# Patient Record
Sex: Female | Born: 1955 | Race: Black or African American | Hispanic: No | Marital: Single | Smoking: Current every day smoker
Health system: Southern US, Community
[De-identification: ages and names within clinical notes are randomized; demographics above are authoritative.]

## PROBLEM LIST (undated history)

## (undated) DIAGNOSIS — E890 Postprocedural hypothyroidism: Secondary | ICD-10-CM

## (undated) DIAGNOSIS — E119 Type 2 diabetes mellitus without complications: Secondary | ICD-10-CM

## (undated) DIAGNOSIS — K219 Gastro-esophageal reflux disease without esophagitis: Secondary | ICD-10-CM

## (undated) DIAGNOSIS — I10 Essential (primary) hypertension: Secondary | ICD-10-CM

## (undated) DIAGNOSIS — M1990 Unspecified osteoarthritis, unspecified site: Secondary | ICD-10-CM

## (undated) DIAGNOSIS — E7800 Pure hypercholesterolemia, unspecified: Secondary | ICD-10-CM

## (undated) HISTORY — PX: WISDOM TOOTH EXTRACTION: SHX21

## (undated) HISTORY — PX: ABDOMINAL HYSTERECTOMY: SHX81

## (undated) HISTORY — PX: COLON SURGERY: SHX602

---

## 1999-04-29 ENCOUNTER — Emergency Department (HOSPITAL_COMMUNITY): Admission: EM | Admit: 1999-04-29 | Discharge: 1999-04-30 | Payer: Self-pay

## 1999-04-30 ENCOUNTER — Emergency Department (HOSPITAL_COMMUNITY): Admission: EM | Admit: 1999-04-30 | Discharge: 1999-04-30 | Payer: Self-pay | Admitting: Emergency Medicine

## 2005-01-20 ENCOUNTER — Other Ambulatory Visit: Admission: RE | Admit: 2005-01-20 | Discharge: 2005-01-20 | Payer: Self-pay | Admitting: Obstetrics & Gynecology

## 2006-10-09 ENCOUNTER — Encounter: Admission: RE | Admit: 2006-10-09 | Discharge: 2006-10-09 | Payer: Self-pay | Admitting: Endocrinology

## 2006-12-01 ENCOUNTER — Encounter: Admission: RE | Admit: 2006-12-01 | Discharge: 2006-12-01 | Payer: Self-pay | Admitting: Endocrinology

## 2018-07-03 DIAGNOSIS — E890 Postprocedural hypothyroidism: Secondary | ICD-10-CM | POA: Diagnosis not present

## 2018-07-03 DIAGNOSIS — E1165 Type 2 diabetes mellitus with hyperglycemia: Secondary | ICD-10-CM | POA: Diagnosis not present

## 2018-07-03 DIAGNOSIS — E7800 Pure hypercholesterolemia, unspecified: Secondary | ICD-10-CM | POA: Diagnosis not present

## 2018-07-09 DIAGNOSIS — E7800 Pure hypercholesterolemia, unspecified: Secondary | ICD-10-CM | POA: Diagnosis not present

## 2018-07-09 DIAGNOSIS — I10 Essential (primary) hypertension: Secondary | ICD-10-CM | POA: Diagnosis not present

## 2018-07-09 DIAGNOSIS — E890 Postprocedural hypothyroidism: Secondary | ICD-10-CM | POA: Diagnosis not present

## 2018-07-09 DIAGNOSIS — E1165 Type 2 diabetes mellitus with hyperglycemia: Secondary | ICD-10-CM | POA: Diagnosis not present

## 2018-09-12 DIAGNOSIS — E890 Postprocedural hypothyroidism: Secondary | ICD-10-CM | POA: Diagnosis not present

## 2019-01-09 DIAGNOSIS — E890 Postprocedural hypothyroidism: Secondary | ICD-10-CM | POA: Diagnosis not present

## 2019-01-09 DIAGNOSIS — E1165 Type 2 diabetes mellitus with hyperglycemia: Secondary | ICD-10-CM | POA: Diagnosis not present

## 2019-01-09 DIAGNOSIS — I10 Essential (primary) hypertension: Secondary | ICD-10-CM | POA: Diagnosis not present

## 2019-01-09 DIAGNOSIS — E7800 Pure hypercholesterolemia, unspecified: Secondary | ICD-10-CM | POA: Diagnosis not present

## 2019-01-16 DIAGNOSIS — E890 Postprocedural hypothyroidism: Secondary | ICD-10-CM | POA: Diagnosis not present

## 2019-01-16 DIAGNOSIS — I10 Essential (primary) hypertension: Secondary | ICD-10-CM | POA: Diagnosis not present

## 2019-01-16 DIAGNOSIS — E1165 Type 2 diabetes mellitus with hyperglycemia: Secondary | ICD-10-CM | POA: Diagnosis not present

## 2019-01-16 DIAGNOSIS — E7800 Pure hypercholesterolemia, unspecified: Secondary | ICD-10-CM | POA: Diagnosis not present

## 2019-07-23 DIAGNOSIS — E1165 Type 2 diabetes mellitus with hyperglycemia: Secondary | ICD-10-CM | POA: Diagnosis not present

## 2019-07-23 DIAGNOSIS — E7800 Pure hypercholesterolemia, unspecified: Secondary | ICD-10-CM | POA: Diagnosis not present

## 2019-07-23 DIAGNOSIS — E890 Postprocedural hypothyroidism: Secondary | ICD-10-CM | POA: Diagnosis not present

## 2019-07-26 DIAGNOSIS — E7800 Pure hypercholesterolemia, unspecified: Secondary | ICD-10-CM | POA: Diagnosis not present

## 2019-07-26 DIAGNOSIS — E1165 Type 2 diabetes mellitus with hyperglycemia: Secondary | ICD-10-CM | POA: Diagnosis not present

## 2019-07-26 DIAGNOSIS — E890 Postprocedural hypothyroidism: Secondary | ICD-10-CM | POA: Diagnosis not present

## 2019-07-26 DIAGNOSIS — I10 Essential (primary) hypertension: Secondary | ICD-10-CM | POA: Diagnosis not present

## 2020-08-07 DIAGNOSIS — I10 Essential (primary) hypertension: Secondary | ICD-10-CM | POA: Diagnosis not present

## 2020-08-07 DIAGNOSIS — E7800 Pure hypercholesterolemia, unspecified: Secondary | ICD-10-CM | POA: Diagnosis not present

## 2020-08-07 DIAGNOSIS — E890 Postprocedural hypothyroidism: Secondary | ICD-10-CM | POA: Diagnosis not present

## 2020-08-07 DIAGNOSIS — E1165 Type 2 diabetes mellitus with hyperglycemia: Secondary | ICD-10-CM | POA: Diagnosis not present

## 2020-08-12 ENCOUNTER — Other Ambulatory Visit: Payer: Self-pay | Admitting: Endocrinology

## 2020-08-12 DIAGNOSIS — R599 Enlarged lymph nodes, unspecified: Secondary | ICD-10-CM

## 2020-08-12 DIAGNOSIS — Z23 Encounter for immunization: Secondary | ICD-10-CM | POA: Diagnosis not present

## 2020-08-12 DIAGNOSIS — E7800 Pure hypercholesterolemia, unspecified: Secondary | ICD-10-CM | POA: Diagnosis not present

## 2020-08-12 DIAGNOSIS — I10 Essential (primary) hypertension: Secondary | ICD-10-CM | POA: Diagnosis not present

## 2020-08-12 DIAGNOSIS — E890 Postprocedural hypothyroidism: Secondary | ICD-10-CM | POA: Diagnosis not present

## 2020-08-12 DIAGNOSIS — E1165 Type 2 diabetes mellitus with hyperglycemia: Secondary | ICD-10-CM | POA: Diagnosis not present

## 2020-08-25 ENCOUNTER — Ambulatory Visit
Admission: RE | Admit: 2020-08-25 | Discharge: 2020-08-25 | Disposition: A | Discharge status: Home / Self Care | Payer: Medicare HMO | Source: Ambulatory Visit | Attending: Endocrinology | Admitting: Endocrinology

## 2020-08-25 ENCOUNTER — Other Ambulatory Visit: Payer: Self-pay

## 2020-08-25 DIAGNOSIS — K118 Other diseases of salivary glands: Secondary | ICD-10-CM | POA: Diagnosis not present

## 2020-08-25 DIAGNOSIS — R599 Enlarged lymph nodes, unspecified: Secondary | ICD-10-CM

## 2020-08-25 DIAGNOSIS — R221 Localized swelling, mass and lump, neck: Secondary | ICD-10-CM | POA: Diagnosis not present

## 2020-08-25 DIAGNOSIS — M47819 Spondylosis without myelopathy or radiculopathy, site unspecified: Secondary | ICD-10-CM | POA: Diagnosis not present

## 2020-08-25 DIAGNOSIS — I7090 Unspecified atherosclerosis: Secondary | ICD-10-CM | POA: Diagnosis not present

## 2020-08-25 MED ORDER — IOPAMIDOL (ISOVUE-300) INJECTION 61%
75.0000 mL | Freq: Once | INTRAVENOUS | Status: AC | PRN
  Administered 2020-08-25: 75 mL via INTRAVENOUS

## 2020-11-21 DIAGNOSIS — E1129 Type 2 diabetes mellitus with other diabetic kidney complication: Secondary | ICD-10-CM

## 2020-11-21 HISTORY — DX: Type 2 diabetes mellitus with other diabetic kidney complication: E11.29

## 2021-01-21 ENCOUNTER — Encounter (HOSPITAL_COMMUNITY): Payer: Self-pay | Admitting: Emergency Medicine

## 2021-01-21 ENCOUNTER — Emergency Department (HOSPITAL_COMMUNITY): Payer: Medicare HMO

## 2021-01-21 ENCOUNTER — Inpatient Hospital Stay (HOSPITAL_COMMUNITY)
Admission: EM | Admit: 2021-01-21 | Discharge: 2021-02-01 | DRG: 872 | Disposition: A | Discharge status: Skilled Nursing Facility | Payer: Medicare HMO | Attending: Internal Medicine | Admitting: Internal Medicine

## 2021-01-21 DIAGNOSIS — M6282 Rhabdomyolysis: Secondary | ICD-10-CM | POA: Diagnosis not present

## 2021-01-21 DIAGNOSIS — N179 Acute kidney failure, unspecified: Secondary | ICD-10-CM

## 2021-01-21 DIAGNOSIS — A411 Sepsis due to other specified staphylococcus: Principal | ICD-10-CM | POA: Diagnosis present

## 2021-01-21 DIAGNOSIS — A4151 Sepsis due to Escherichia coli [E. coli]: Secondary | ICD-10-CM | POA: Diagnosis not present

## 2021-01-21 DIAGNOSIS — M6281 Muscle weakness (generalized): Secondary | ICD-10-CM | POA: Diagnosis not present

## 2021-01-21 DIAGNOSIS — E119 Type 2 diabetes mellitus without complications: Secondary | ICD-10-CM

## 2021-01-21 DIAGNOSIS — R6520 Severe sepsis without septic shock: Secondary | ICD-10-CM | POA: Diagnosis not present

## 2021-01-21 DIAGNOSIS — R0902 Hypoxemia: Secondary | ICD-10-CM | POA: Diagnosis not present

## 2021-01-21 DIAGNOSIS — S80212A Abrasion, left knee, initial encounter: Secondary | ICD-10-CM | POA: Diagnosis present

## 2021-01-21 DIAGNOSIS — S80211A Abrasion, right knee, initial encounter: Secondary | ICD-10-CM | POA: Diagnosis present

## 2021-01-21 DIAGNOSIS — B9620 Unspecified Escherichia coli [E. coli] as the cause of diseases classified elsewhere: Secondary | ICD-10-CM | POA: Diagnosis not present

## 2021-01-21 DIAGNOSIS — R7989 Other specified abnormal findings of blood chemistry: Secondary | ICD-10-CM | POA: Diagnosis present

## 2021-01-21 DIAGNOSIS — M2550 Pain in unspecified joint: Secondary | ICD-10-CM | POA: Diagnosis not present

## 2021-01-21 DIAGNOSIS — S2242XA Multiple fractures of ribs, left side, initial encounter for closed fracture: Secondary | ICD-10-CM | POA: Diagnosis not present

## 2021-01-21 DIAGNOSIS — S7001XA Contusion of right hip, initial encounter: Secondary | ICD-10-CM | POA: Diagnosis present

## 2021-01-21 DIAGNOSIS — R55 Syncope and collapse: Secondary | ICD-10-CM | POA: Diagnosis not present

## 2021-01-21 DIAGNOSIS — R278 Other lack of coordination: Secondary | ICD-10-CM | POA: Diagnosis not present

## 2021-01-21 DIAGNOSIS — E7800 Pure hypercholesterolemia, unspecified: Secondary | ICD-10-CM | POA: Diagnosis present

## 2021-01-21 DIAGNOSIS — W1830XA Fall on same level, unspecified, initial encounter: Secondary | ICD-10-CM | POA: Diagnosis present

## 2021-01-21 DIAGNOSIS — I471 Supraventricular tachycardia: Secondary | ICD-10-CM | POA: Diagnosis not present

## 2021-01-21 DIAGNOSIS — N390 Urinary tract infection, site not specified: Secondary | ICD-10-CM | POA: Diagnosis present

## 2021-01-21 DIAGNOSIS — Y92010 Kitchen of single-family (private) house as the place of occurrence of the external cause: Secondary | ICD-10-CM

## 2021-01-21 DIAGNOSIS — Z79890 Hormone replacement therapy (postmenopausal): Secondary | ICD-10-CM | POA: Diagnosis not present

## 2021-01-21 DIAGNOSIS — I119 Hypertensive heart disease without heart failure: Secondary | ICD-10-CM | POA: Diagnosis present

## 2021-01-21 DIAGNOSIS — R531 Weakness: Secondary | ICD-10-CM | POA: Diagnosis present

## 2021-01-21 DIAGNOSIS — S50311A Abrasion of right elbow, initial encounter: Secondary | ICD-10-CM | POA: Diagnosis present

## 2021-01-21 DIAGNOSIS — E1165 Type 2 diabetes mellitus with hyperglycemia: Secondary | ICD-10-CM | POA: Diagnosis not present

## 2021-01-21 DIAGNOSIS — I959 Hypotension, unspecified: Secondary | ICD-10-CM | POA: Diagnosis not present

## 2021-01-21 DIAGNOSIS — S8002XA Contusion of left knee, initial encounter: Secondary | ICD-10-CM | POA: Diagnosis present

## 2021-01-21 DIAGNOSIS — I10 Essential (primary) hypertension: Secondary | ICD-10-CM | POA: Diagnosis not present

## 2021-01-21 DIAGNOSIS — S51012A Laceration without foreign body of left elbow, initial encounter: Secondary | ICD-10-CM | POA: Diagnosis present

## 2021-01-21 DIAGNOSIS — Z20822 Contact with and (suspected) exposure to covid-19: Secondary | ICD-10-CM | POA: Diagnosis not present

## 2021-01-21 DIAGNOSIS — E872 Acidosis: Secondary | ICD-10-CM | POA: Diagnosis not present

## 2021-01-21 DIAGNOSIS — S0083XA Contusion of other part of head, initial encounter: Secondary | ICD-10-CM | POA: Diagnosis present

## 2021-01-21 DIAGNOSIS — A419 Sepsis, unspecified organism: Secondary | ICD-10-CM | POA: Diagnosis present

## 2021-01-21 DIAGNOSIS — E11649 Type 2 diabetes mellitus with hypoglycemia without coma: Secondary | ICD-10-CM

## 2021-01-21 DIAGNOSIS — R2681 Unsteadiness on feet: Secondary | ICD-10-CM | POA: Diagnosis not present

## 2021-01-21 DIAGNOSIS — S8001XA Contusion of right knee, initial encounter: Secondary | ICD-10-CM | POA: Diagnosis present

## 2021-01-21 DIAGNOSIS — L89022 Pressure ulcer of left elbow, stage 2: Secondary | ICD-10-CM | POA: Diagnosis present

## 2021-01-21 DIAGNOSIS — R410 Disorientation, unspecified: Secondary | ICD-10-CM | POA: Diagnosis not present

## 2021-01-21 DIAGNOSIS — S40022A Contusion of left upper arm, initial encounter: Secondary | ICD-10-CM | POA: Diagnosis present

## 2021-01-21 DIAGNOSIS — M25519 Pain in unspecified shoulder: Secondary | ICD-10-CM | POA: Diagnosis not present

## 2021-01-21 DIAGNOSIS — Z7401 Bed confinement status: Secondary | ICD-10-CM | POA: Diagnosis not present

## 2021-01-21 DIAGNOSIS — L89891 Pressure ulcer of other site, stage 1: Secondary | ICD-10-CM | POA: Diagnosis present

## 2021-01-21 DIAGNOSIS — E785 Hyperlipidemia, unspecified: Secondary | ICD-10-CM | POA: Diagnosis present

## 2021-01-21 DIAGNOSIS — S50312A Abrasion of left elbow, initial encounter: Secondary | ICD-10-CM | POA: Diagnosis present

## 2021-01-21 DIAGNOSIS — Z79899 Other long term (current) drug therapy: Secondary | ICD-10-CM

## 2021-01-21 DIAGNOSIS — S300XXA Contusion of lower back and pelvis, initial encounter: Secondary | ICD-10-CM | POA: Diagnosis present

## 2021-01-21 DIAGNOSIS — R404 Transient alteration of awareness: Secondary | ICD-10-CM | POA: Diagnosis not present

## 2021-01-21 DIAGNOSIS — R000 Tachycardia, unspecified: Secondary | ICD-10-CM | POA: Diagnosis not present

## 2021-01-21 DIAGNOSIS — K118 Other diseases of salivary glands: Secondary | ICD-10-CM | POA: Diagnosis present

## 2021-01-21 DIAGNOSIS — Z888 Allergy status to other drugs, medicaments and biological substances status: Secondary | ICD-10-CM

## 2021-01-21 DIAGNOSIS — Z90710 Acquired absence of both cervix and uterus: Secondary | ICD-10-CM

## 2021-01-21 DIAGNOSIS — S40021A Contusion of right upper arm, initial encounter: Secondary | ICD-10-CM | POA: Diagnosis present

## 2021-01-21 DIAGNOSIS — R262 Difficulty in walking, not elsewhere classified: Secondary | ICD-10-CM | POA: Diagnosis not present

## 2021-01-21 DIAGNOSIS — Z794 Long term (current) use of insulin: Secondary | ICD-10-CM

## 2021-01-21 DIAGNOSIS — K2970 Gastritis, unspecified, without bleeding: Secondary | ICD-10-CM | POA: Diagnosis present

## 2021-01-21 DIAGNOSIS — L89012 Pressure ulcer of right elbow, stage 2: Secondary | ICD-10-CM | POA: Diagnosis present

## 2021-01-21 DIAGNOSIS — E876 Hypokalemia: Secondary | ICD-10-CM | POA: Diagnosis present

## 2021-01-21 DIAGNOSIS — T796XXD Traumatic ischemia of muscle, subsequent encounter: Secondary | ICD-10-CM | POA: Diagnosis not present

## 2021-01-21 DIAGNOSIS — T796XXA Traumatic ischemia of muscle, initial encounter: Secondary | ICD-10-CM | POA: Diagnosis not present

## 2021-01-21 DIAGNOSIS — S2249XD Multiple fractures of ribs, unspecified side, subsequent encounter for fracture with routine healing: Secondary | ICD-10-CM | POA: Diagnosis not present

## 2021-01-21 DIAGNOSIS — E1129 Type 2 diabetes mellitus with other diabetic kidney complication: Secondary | ICD-10-CM

## 2021-01-21 DIAGNOSIS — Z043 Encounter for examination and observation following other accident: Secondary | ICD-10-CM | POA: Diagnosis not present

## 2021-01-21 DIAGNOSIS — R519 Headache, unspecified: Secondary | ICD-10-CM | POA: Diagnosis not present

## 2021-01-21 DIAGNOSIS — E890 Postprocedural hypothyroidism: Secondary | ICD-10-CM | POA: Diagnosis not present

## 2021-01-21 HISTORY — DX: Postprocedural hypothyroidism: E89.0

## 2021-01-21 HISTORY — DX: Essential (primary) hypertension: I10

## 2021-01-21 HISTORY — DX: Type 2 diabetes mellitus without complications: E11.9

## 2021-01-21 HISTORY — DX: Pure hypercholesterolemia, unspecified: E78.00

## 2021-01-21 LAB — URINALYSIS, COMPLETE (UACMP) WITH MICROSCOPIC
Bilirubin Urine: NEGATIVE
Glucose, UA: NEGATIVE mg/dL
Ketones, ur: 5 mg/dL — AB
Nitrite: NEGATIVE
Protein, ur: 100 mg/dL — AB
Specific Gravity, Urine: 1.02 (ref 1.005–1.030)
WBC, UA: >50 WBC/hpf — ABNORMAL HIGH (ref 0–5)
pH: 5 (ref 5.0–8.0)

## 2021-01-21 LAB — I-STAT ARTERIAL BLOOD GAS, ED
Acid-base deficit: 10 mmol/L — ABNORMAL HIGH (ref 0.0–2.0)
Bicarbonate: 14.3 mmol/L — ABNORMAL LOW (ref 20.0–28.0)
Calcium, Ion: 1.18 mmol/L (ref 1.15–1.40)
HCT: 36 % (ref 36.0–46.0)
Hemoglobin: 12.2 g/dL (ref 12.0–15.0)
O2 Saturation: 98 %
Patient temperature: 99.3
Potassium: 3.1 mmol/L — ABNORMAL LOW (ref 3.5–5.1)
Sodium: 136 mmol/L (ref 135–145)
TCO2: 15 mmol/L — ABNORMAL LOW (ref 22–32)
pCO2 arterial: 27.9 mmHg — ABNORMAL LOW (ref 32.0–48.0)
pH, Arterial: 7.319 — ABNORMAL LOW (ref 7.350–7.450)
pO2, Arterial: 117 mmHg — ABNORMAL HIGH (ref 83.0–108.0)

## 2021-01-21 LAB — CBC
HCT: 35.8 % — ABNORMAL LOW (ref 36.0–46.0)
Hemoglobin: 13.2 g/dL (ref 12.0–15.0)
MCH: 33.6 pg (ref 26.0–34.0)
MCHC: 36.9 g/dL — ABNORMAL HIGH (ref 30.0–36.0)
MCV: 91.1 fL (ref 80.0–100.0)
Platelets: 152 10*3/uL (ref 150–400)
RBC: 3.93 MIL/uL (ref 3.87–5.11)
RDW: 11.9 % (ref 11.5–15.5)
WBC: 16.6 10*3/uL — ABNORMAL HIGH (ref 4.0–10.5)
nRBC: 0 % (ref 0.0–0.2)

## 2021-01-21 LAB — RESP PANEL BY RT-PCR (FLU A&B, COVID) ARPGX2
Influenza A by PCR: NEGATIVE
Influenza B by PCR: NEGATIVE
SARS Coronavirus 2 by RT PCR: NEGATIVE

## 2021-01-21 LAB — CBC WITH DIFFERENTIAL/PLATELET
Abs Immature Granulocytes: 0.15 10*3/uL — ABNORMAL HIGH (ref 0.00–0.07)
Basophils Absolute: 0 10*3/uL (ref 0.0–0.1)
Basophils Relative: 0 %
Eosinophils Absolute: 0 10*3/uL (ref 0.0–0.5)
Eosinophils Relative: 0 %
HCT: 46 % (ref 36.0–46.0)
Hemoglobin: 16.4 g/dL — ABNORMAL HIGH (ref 12.0–15.0)
Immature Granulocytes: 1 %
Lymphocytes Relative: 6 %
Lymphs Abs: 1.2 10*3/uL (ref 0.7–4.0)
MCH: 32.5 pg (ref 26.0–34.0)
MCHC: 35.7 g/dL (ref 30.0–36.0)
MCV: 91.3 fL (ref 80.0–100.0)
Monocytes Absolute: 1.2 10*3/uL — ABNORMAL HIGH (ref 0.1–1.0)
Monocytes Relative: 6 %
Neutro Abs: 18.2 10*3/uL — ABNORMAL HIGH (ref 1.7–7.7)
Neutrophils Relative %: 87 %
Platelets: 188 10*3/uL (ref 150–400)
RBC: 5.04 MIL/uL (ref 3.87–5.11)
RDW: 11.6 % (ref 11.5–15.5)
WBC: 20.8 10*3/uL — ABNORMAL HIGH (ref 4.0–10.5)
nRBC: 0 % (ref 0.0–0.2)

## 2021-01-21 LAB — COMPREHENSIVE METABOLIC PANEL
ALT: 160 U/L — ABNORMAL HIGH (ref 0–44)
AST: 494 U/L — ABNORMAL HIGH (ref 15–41)
Albumin: 3.8 g/dL (ref 3.5–5.0)
Alkaline Phosphatase: 58 U/L (ref 38–126)
Anion gap: 19 — ABNORMAL HIGH (ref 5–15)
BUN: 29 mg/dL — ABNORMAL HIGH (ref 8–23)
CO2: 15 mmol/L — ABNORMAL LOW (ref 22–32)
Calcium: 9.6 mg/dL (ref 8.9–10.3)
Chloride: 98 mmol/L (ref 98–111)
Creatinine, Ser: 1.27 mg/dL — ABNORMAL HIGH (ref 0.44–1.00)
GFR, Estimated: 47 mL/min — ABNORMAL LOW (ref >60)
Glucose, Bld: 142 mg/dL — ABNORMAL HIGH (ref 70–99)
Potassium: 3.8 mmol/L (ref 3.5–5.1)
Sodium: 132 mmol/L — ABNORMAL LOW (ref 135–145)
Total Bilirubin: 1.8 mg/dL — ABNORMAL HIGH (ref 0.3–1.2)
Total Protein: 7.2 g/dL (ref 6.5–8.1)

## 2021-01-21 LAB — TSH: TSH: 8.604 u[IU]/mL — ABNORMAL HIGH (ref 0.350–4.500)

## 2021-01-21 LAB — AMMONIA: Ammonia: 25 umol/L (ref 9–35)

## 2021-01-21 LAB — TROPONIN I (HIGH SENSITIVITY): Troponin I (High Sensitivity): 3316 ng/L (ref <18)

## 2021-01-21 LAB — POTASSIUM: Potassium: 3.2 mmol/L — ABNORMAL LOW (ref 3.5–5.1)

## 2021-01-21 LAB — APTT: aPTT: 25 seconds (ref 24–36)

## 2021-01-21 LAB — MAGNESIUM: Magnesium: 1.5 mg/dL — ABNORMAL LOW (ref 1.7–2.4)

## 2021-01-21 LAB — PHOSPHORUS: Phosphorus: 3.1 mg/dL (ref 2.5–4.6)

## 2021-01-21 LAB — HEMOGLOBIN A1C
Hgb A1c MFr Bld: 5.5 % (ref 4.8–5.6)
Mean Plasma Glucose: 111.15 mg/dL

## 2021-01-21 LAB — LACTIC ACID, PLASMA
Lactic Acid, Venous: 5.9 mmol/L (ref 0.5–1.9)
Lactic Acid, Venous: 1.3 mmol/L (ref 0.5–1.9)

## 2021-01-21 LAB — HIV ANTIBODY (ROUTINE TESTING W REFLEX): HIV Screen 4th Generation wRfx: NONREACTIVE

## 2021-01-21 LAB — CULTURE, BLOOD (ROUTINE X 2): Culture: NO GROWTH

## 2021-01-21 LAB — CREATININE, SERUM
Creatinine, Ser: 0.96 mg/dL (ref 0.44–1.00)
GFR, Estimated: >60 mL/min (ref >60)

## 2021-01-21 LAB — LACTATE DEHYDROGENASE: LDH: 725 U/L — ABNORMAL HIGH (ref 98–192)

## 2021-01-21 LAB — PROTIME-INR
INR: 1.1 (ref 0.8–1.2)
Prothrombin Time: 14 seconds (ref 11.4–15.2)

## 2021-01-21 LAB — GLUCOSE, CAPILLARY: Glucose-Capillary: 117 mg/dL — ABNORMAL HIGH (ref 70–99)

## 2021-01-21 LAB — CBG MONITORING, ED: Glucose-Capillary: 151 mg/dL — ABNORMAL HIGH (ref 70–99)

## 2021-01-21 LAB — CK: Total CK: 26648 U/L — ABNORMAL HIGH (ref 38–234)

## 2021-01-21 MED ORDER — SODIUM CHLORIDE 0.9 % IV BOLUS
2000.0000 mL | Freq: Once | INTRAVENOUS | Status: AC
  Administered 2021-01-21: 2000 mL via INTRAVENOUS

## 2021-01-21 MED ORDER — KETOROLAC TROMETHAMINE 15 MG/ML IJ SOLN
15.0000 mg | Freq: Four times a day (QID) | INTRAMUSCULAR | Status: AC | PRN
  Administered 2021-01-21 – 2021-01-23 (×5): 15 mg via INTRAVENOUS
  Filled 2021-01-21 (×6): qty 1

## 2021-01-21 MED ORDER — SODIUM CHLORIDE 0.9 % IV SOLN: INTRAVENOUS | Status: DC

## 2021-01-21 MED ORDER — THIAMINE HCL 100 MG/ML IJ SOLN
100.0000 mg | Freq: Once | INTRAMUSCULAR | Status: AC
  Administered 2021-01-21: 100 mg via INTRAVENOUS
  Filled 2021-01-21: qty 2

## 2021-01-21 MED ORDER — ADENOSINE 6 MG/2ML IV SOLN
INTRAVENOUS | Status: AC
  Administered 2021-01-21: 6 mg
  Filled 2021-01-21: qty 6

## 2021-01-21 MED ORDER — INSULIN ASPART 100 UNIT/ML ~~LOC~~ SOLN
0.0000 [IU] | Freq: Three times a day (TID) | SUBCUTANEOUS | Status: DC
  Administered 2021-01-24 – 2021-02-01 (×9): 1 [IU] via SUBCUTANEOUS

## 2021-01-21 MED ORDER — ATENOLOL 50 MG PO TABS: 50.0000 mg | ORAL_TABLET | Freq: Every day | ORAL | Status: DC

## 2021-01-21 MED ORDER — LEVOTHYROXINE SODIUM 100 MCG PO TABS
100.0000 ug | ORAL_TABLET | Freq: Every day | ORAL | Status: DC
  Administered 2021-01-22 – 2021-02-01 (×10): 100 ug via ORAL
  Filled 2021-01-21 (×12): qty 1

## 2021-01-21 MED ORDER — VANCOMYCIN HCL 1000 MG/200ML IV SOLN
1000.0000 mg | Freq: Once | INTRAVENOUS | Status: DC
  Filled 2021-01-21: qty 200

## 2021-01-21 MED ORDER — POTASSIUM CHLORIDE CRYS ER 20 MEQ PO TBCR
40.0000 meq | EXTENDED_RELEASE_TABLET | Freq: Once | ORAL | Status: AC
  Administered 2021-01-21: 40 meq via ORAL
  Filled 2021-01-21: qty 2

## 2021-01-21 MED ORDER — IOHEXOL 300 MG/ML  SOLN
100.0000 mL | Freq: Once | INTRAMUSCULAR | Status: AC | PRN
  Administered 2021-01-21: 100 mL via INTRAVENOUS

## 2021-01-21 MED ORDER — HYDROCODONE-ACETAMINOPHEN 5-325 MG PO TABS: 1.0000 | ORAL_TABLET | ORAL | Status: DC | PRN

## 2021-01-21 MED ORDER — ACETAMINOPHEN 325 MG PO TABS: 650.0000 mg | ORAL_TABLET | Freq: Four times a day (QID) | ORAL | Status: DC | PRN

## 2021-01-21 MED ORDER — LACTATED RINGERS IV BOLUS
1000.0000 mL | Freq: Once | INTRAVENOUS | Status: AC
  Administered 2021-01-21: 1000 mL via INTRAVENOUS

## 2021-01-21 MED ORDER — POTASSIUM CHLORIDE IN NACL 20-0.9 MEQ/L-% IV SOLN
INTRAVENOUS | Status: DC
  Filled 2021-01-21 (×7): qty 1000

## 2021-01-21 MED ORDER — SODIUM CHLORIDE 0.9 % IV SOLN
2.0000 g | Freq: Once | INTRAVENOUS | Status: AC
  Administered 2021-01-21: 2 g via INTRAVENOUS
  Filled 2021-01-21: qty 2

## 2021-01-21 MED ORDER — ATENOLOL 25 MG PO TABS
50.0000 mg | ORAL_TABLET | Freq: Every day | ORAL | Status: DC
  Administered 2021-01-22 – 2021-01-25 (×4): 50 mg via ORAL
  Filled 2021-01-21 (×5): qty 2

## 2021-01-21 MED ORDER — SODIUM CHLORIDE 0.9 % IV BOLUS
1000.0000 mL | Freq: Once | INTRAVENOUS | Status: AC
  Administered 2021-01-21: 1000 mL via INTRAVENOUS

## 2021-01-21 MED ORDER — ENOXAPARIN SODIUM 40 MG/0.4ML ~~LOC~~ SOLN
40.0000 mg | SUBCUTANEOUS | Status: DC
  Administered 2021-01-21: 40 mg via SUBCUTANEOUS
  Filled 2021-01-21: qty 0.4

## 2021-01-21 MED ORDER — VANCOMYCIN HCL 1000 MG/200ML IV SOLN
1000.0000 mg | INTRAVENOUS | Status: DC
  Filled 2021-01-21: qty 200

## 2021-01-21 MED ORDER — SALINE SPRAY 0.65 % NA SOLN
1.0000 | Freq: Every day | NASAL | Status: DC | PRN
  Filled 2021-01-21: qty 44

## 2021-01-21 MED ORDER — MAGNESIUM SULFATE 4 GM/100ML IV SOLN
4.0000 g | Freq: Once | INTRAVENOUS | Status: AC
  Administered 2021-01-21: 4 g via INTRAVENOUS
  Filled 2021-01-21 (×2): qty 100

## 2021-01-21 MED ORDER — METRONIDAZOLE IN NACL 5-0.79 MG/ML-% IV SOLN
500.0000 mg | Freq: Once | INTRAVENOUS | Status: AC
  Administered 2021-01-21: 500 mg via INTRAVENOUS
  Filled 2021-01-21: qty 100

## 2021-01-21 MED ORDER — VANCOMYCIN HCL 1750 MG/350ML IV SOLN
1750.0000 mg | Freq: Once | INTRAVENOUS | Status: AC
  Administered 2021-01-21: 1750 mg via INTRAVENOUS
  Filled 2021-01-21: qty 350

## 2021-01-21 MED ORDER — SODIUM CHLORIDE 0.9 % IV SOLN
2.0000 g | Freq: Two times a day (BID) | INTRAVENOUS | Status: DC
  Administered 2021-01-21 – 2021-01-22 (×2): 2 g via INTRAVENOUS
  Filled 2021-01-21 (×2): qty 2

## 2021-01-21 NOTE — Progress Notes (Signed)
Pharmacy Antibiotic Note  Kristina Gates is a 65 y.o. female admitted on 01/21/2021 with sepsis. WBC 20.8, pt is tachycardic and tachypenic with a possible urinary source of infection. Pharmacy has been consulted for vancomycin and cefepime dosing.   Plan: Vancomycin 1750 mg IV x1 Vancomycin 1000 mg IV q24 hours (AUC 453, goal 400-550, Scr 1.27, pt reported wt of 80 kg) Cefepime 2g IV q12 hours Monitor clinical improvement, cultures, renal function, and de-escalation plan  Temp (24hrs), Avg:97.7 F (36.5 C), Min:97.7 F (36.5 C), Max:97.7 F (36.5 C)  Recent Labs  Lab 01/21/21 1116  WBC 20.8*  CREATININE 1.27*    CrCl cannot be calculated (Unknown ideal weight.).    Not on File  Antimicrobials this admission: Vancomycin 3/3 >>  Cefepime 3/3 >>  Metronidazole 3/3 >>  Dose adjustments this admission: N/A  Microbiology results: 3/3 BCx x2: pending 3/3 UCx: pending   Thank you for allowing pharmacy to be a part of this patient's care.  Levada Schilling PharmD Candidate 2022 01/21/2021 1:03 PM

## 2021-01-21 NOTE — ED Provider Notes (Signed)
[facility] EMERGENCY DEPARTMENT Provider Note   CSN: [id number] Arrival date & time: 01/21/21  1048     History Chief Complaint  Patient presents with   Altered Mental Status   Fall    Kristina Gates is a 65 y.o. female With a past medical history of hypertension, hyperlipidemia, postprocedural hypothyroidism, and diabetes.  EMS reports that they were called out to the patient's house today because family had not been able to get in touch with the patient for about 4 days.  Patient was found facedown on the ground covered in urine.  She complained of pain in her shoulders and legs when she was moved onto the gurney.  EMS reports that her vital signs were stable, blood sugar of 186.  Patient does not remember being found on the ground today and states that she has not had any injuries in does not know what happened.  There is a level 5 caveat due to altered mental status.  HPI     Past Medical History:  Diagnosis Date   Diabetes mellitus without complication (HCC)    Hypercholesteremia    Hypertension    Postprocedural hypothyroidism     Patient Active Problem List   Diagnosis Date Noted   Essential hypertension 01/21/2021   Hyperglycemia due to type 2 diabetes mellitus (HCC) 01/21/2021   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/2021   Pure hypercholesterolemia 01/21/2021   ,[facility], Surgical Hx, Social Hx unable to be reviewed at this time due to altered mentation.  OB History   No obstetric history on file.     History reviewed. No pertinent family history.  Social History   Tobacco Use   Smoking status: Never Smoker   Smokeless tobacco: Never Used    Home Medications Prior to Admission medications   Medication Sig Start Date End Date Taking? Authorizing Provider  atenolol (TENORMIN) 50 MG tablet Take 50 mg by mouth daily. 01/19/21   [provider]  atorvastatin (LIPITOR) 40 MG tablet Take 40 mg by mouth daily. 01/19/21   [provider]  hydrochlorothiazide (HYDRODIURIL) 25 MG tablet Take 25 mg by mouth daily. 01/19/21   [provider]  losartan (COZAAR) 100 MG tablet Take 100 mg by mouth daily. 01/19/21   [provider]  NOVOLOG MIX 70/30 FLEXPEN (70-30) 100 UNIT/ML FlexPen Inject 12 Units into the skin every evening. 11/25/20   [provider]  SYNTHROID 100 MCG tablet Take 100 mcg by mouth daily. 01/19/21   [provider]    Allergies    Patient has no allergy information on record.  Review of Systems   Review of Systems  Unable to perform ROS: Mental status change    Physical Exam Updated Vital Signs BP 138/84    Pulse (!) 105    Temp 97.7 F (36.5 C) (Rectal)    Resp (!) 22    SpO2 99%   Physical Exam Vitals and nursing note reviewed.  Constitutional:      General: She is not in acute distress.    Appearance: She is well-developed and well-nourished. She is not diaphoretic.  HENT:     Head: Normocephalic.     Comments: Large contusion over the forehead, TTP    Nose:     Comments: Crusted nasal congestion    Mouth/Throat:     Mouth: Mucous membranes are dry.  Eyes:     General: No scleral icterus.    Extraocular Movements: Extraocular movements intact.  Conjunctiva/sclera: Conjunctivae normal.     Pupils: Pupils are equal, round, and reactive to light.  Cardiovascular:     Rate and Rhythm: Normal rate and regular rhythm.     Heart sounds: Normal heart sounds. No murmur heard. No friction rub. No gallop.   Pulmonary:     Effort: Pulmonary effort is normal. No respiratory distress.     Breath sounds: Normal breath sounds.  Abdominal:     General: Bowel sounds are normal. There is no distension.     Palpations: Abdomen is soft. There is no mass.     Tenderness: There is no abdominal tenderness. There is no guarding.     Comments: Right upper quadrant surgical scar, midline lower abdominal surgical scar present  Musculoskeletal:     Cervical back:  Normal range of motion.     Comments: Bilateral shoulder pain, difficulty lifting the shoulders off the table.  Weak but able to move both lower extremities against gravity.  No pelvic instability. No midline spinal tenderness, step-off or crepitus.  Skin:    General: Skin is warm and dry.     Comments: Multiple areas of bruising over the bilateral knees, right hip and buttock, bilateral arms, skin tear noted on the left elbow.  Large bruise across the forehead.  Neurological:     Mental Status: She is alert. She is disoriented and confused.     GCS: GCS eye subscore is 4. GCS verbal subscore is 4. GCS motor subscore is 6.     Cranial Nerves: Cranial nerves are intact.     Sensory: Sensation is intact.     Motor: Motor function is intact.  Psychiatric:        Behavior: Behavior normal.     ED Results / Procedures / Treatments   Labs (all labs ordered are listed, but only abnormal results are displayed) Labs Reviewed  COMPREHENSIVE METABOLIC PANEL - Abnormal; Notable for the following components:      Result Value   Sodium 132 (*)    CO2 15 (*)    Glucose, Bld 142 (*)    BUN 29 (*)    Creatinine, Ser 1.27 (*)    AST 494 (*)    ALT 160 (*)    Total Bilirubin 1.8 (*)    GFR, Estimated 47 (*)    Anion gap 19 (*)    All other components within normal limits  CBC WITH DIFFERENTIAL/PLATELET - Abnormal; Notable for the following components:   WBC 20.8 (*)    Hemoglobin 16.4 (*)    Neutro Abs 18.2 (*)    Monocytes Absolute 1.2 (*)    Abs Immature Granulocytes 0.15 (*)    All other components within normal limits  TSH - Abnormal; Notable for the following components:   TSH 8.604 (*)    All other components within normal limits  CBG MONITORING, ED - Abnormal; Notable for the following components:   Glucose-Capillary 151 (*)    All other components within normal limits  CULTURE, BLOOD (ROUTINE X 2)  CULTURE, BLOOD (ROUTINE X 2)  URINE CULTURE  AMMONIA  URINALYSIS, COMPLETE  (UACMP) WITH MICROSCOPIC  CK  PROTIME-INR  APTT  LACTIC ACID, PLASMA  LACTIC ACID, PLASMA  URINALYSIS, ROUTINE W REFLEX MICROSCOPIC    EKG None  Radiology DG Chest Port 1 View  Result Date: 01/21/2021 CLINICAL DATA:  Altered mental status following a fall today. EXAM: PORTABLE CHEST 1 VIEW COMPARISON:  None. FINDINGS: Normal sized heart. Mildly tortuous and minimally  calcified thoracic aorta. Minimal linear atelectasis or scarring in the left lower lung zone. Otherwise clear lungs with normal vascularity. Mildly displaced left 4th posterior rib fracture. Minimally displaced left 6th and 7th posterior rib fractures. No pneumothorax. IMPRESSION: Left 4th, 6th and 7th posterior rib fractures without pneumothorax. Electronically Signed   By: Beckie Salts M.D.   On: 01/21/2021 11:45    Procedures .Critical Care Performed by: Arthor Captain, PA-C Authorized by: Arthor Captain, PA-C   Critical care provider statement:    Critical care time (minutes):  60   Critical care was necessary to treat or prevent imminent or life-threatening deterioration of the following conditions:  Sepsis, trauma and dehydration   Critical care was time spent personally by me on the following activities:  Discussions with consultants, evaluation of patient's response to treatment, examination of patient, ordering and performing treatments and interventions, ordering and review of laboratory studies, ordering and review of radiographic studies, pulse oximetry, re-evaluation of patient's condition, obtaining history from patient or surrogate and review of old charts    Medications Ordered in ED Medications  0.9 %  sodium chloride infusion (has no administration in time range)  ceFEPIme (MAXIPIME) 2 g in sodium chloride 0.9 % 100 mL IVPB (has no administration in time range)  metroNIDAZOLE (FLAGYL) IVPB 500 mg (has no administration in time range)  vancomycin (VANCOREADY) IVPB 1000 mg/200 mL (has no administration  in time range)  thiamine (B-1) injection 100 mg (100 mg Intravenous Given 01/21/21 1135)    ED Course  I have reviewed the triage vital signs and the nursing notes.  Pertinent labs & imaging results that were available during my care of the patient were reviewed by me and considered in my medical decision making (see chart for details).  Clinical Course as of 01/22/21 1013  Thu Jan 21, 2021  1245 TSH(!): 8.604 TSH markedly elevated [AH]  1249 AST(!): 494 [AH]  1249 ALT(!): 160 [AH]  1249 Total Bilirubin(!): 1.8 [AH]  1257 Anion gap(!): 19 [AH]  1348 CK Total(!): 26,648 [AH]  1419 Urinalysis, Complete w Microscopic Urine, Catheterized(!) [AH]  1434 Lactic Acid, Venous(!!): 5.9 [AH]  1713 Called to the room for severe tachycardia to 200. Consistent with SVT, no hx of the same or of a fib. She was converted to normal sinus rhythm with 6 mg of adenosine, Dr. Donnald Garre present and guiding treatment. Addtl laboratory workup initiated, LR ordered, electrolyte levels, potassium replacement. Consider bicarb. Consider beta blocker for rate control once VS improve. Trauma surgery consulted to be made aware of multiple rib fractures. Will consult hospitalist for admission. [JR]  1723 Dr. Cliffton Asters with trauma surgery made aware of patient's rib fractures and plan for admission.  Will consult. [JR]  1733 Dr. Luberta Robertson accepting medical admission. [JR]    Clinical Course User Index [AH] Arthor Captain, PA-C [JR] Robinson, [place] N, PA-C   MDM Rules/Calculators/A&P                          12:37 PM BP 138/84    Pulse (!) 105    Temp 97.7 F (36.5 C) (Rectal)    Resp (!) 22    SpO2 99%  Patient here with AMS. The differential diagnosis for AMS is extensive and includes, but is not limited to: drug overdose - opioids, alcohol, sedatives, antipsychotics, drug withdrawal, others; Metabolic: hypoxia, hypoglycemia, hyperglycemia, hypercalcemia, hypernatremia, hyponatremia, uremia, hepatic encephalopathy,  hypothyroidism, hyperthyroidism, vitamin B12 or thiamine deficiency, carbon monoxide  poisoning, Wilson's disease, Lactic acidosis, DKA/HHOS; Infectious: meningitis, encephalitis, bacteremia/sepsis, urinary tract infection, pneumonia, neurosyphilis; Structural: Space-occupying lesion, (brain tumor, subdural hematoma, hydrocephalus,); Vascular: stroke, subarachnoid hemorrhage, coronary ischemia, hypertensive encephalopathy, CNS vasculitis, thrombotic thrombocytopenic purpura, disseminated intravascular coagulation, hyperviscosity; Psychiatric: Schizophrenia, depression; Other: Seizure, hypothermia, heat stroke, ICU psychosis, dementia -"sundowning."  I have ordered labs that include CBC, CMP, ammonia, urinalysis, CBG which is resulted at 151, blood cultures, PT/INR, CK, APTT and TSH levels.  I have ordered a 1 view chest x-ray and CT head and c spine  12:38 PM Patient CBC w/ WHite count of 20.  Meets SIRS + suspect UTI given strong odor of urine. I have initiated protocol  This is a 64 year old female found face down in her home after family had been unable to contact her for 4 days.  I ordered and reviewed labs which include CBC which showed elevated white blood cell count of 20,000 with tachycardia, tachypnea she fits sepsis criteria and this was initiated.  Further lab values show patient's TSH level at 8.604 consistent with hypothyroidism, urine appears infected and patient covered with broad-spectrum antibiotics at initiation of sepsis protocol.  Patient's lactic acid level at 5.9 she is receiving greater than 30 mL/kg fluid resuscitation given the fact that she also has rhabdomyolysis with CK level greater than 25,000.  The patient is critically ill and will need admission however I am currently awaiting CT results on C-spine, head, chest abdomen and pelvis.  Patient's portable 1 view chest x-ray shows a left fourth sixth and seventh posterior rib fracture.  I personally reviewed these images.  I have  given signout to PA Roxan Hockey who will assume care of the patient who will need admission.  Admitted service based upon findings of CT imaging.  Patient will need trauma consult given rib fractures.  Final Clinical Impression(s) / ED Diagnoses Final diagnoses:  None    Rx / DC Orders ED Discharge Orders    None       Arthor Captain, PA-C 01/22/21 1017    Margarita Grizzle, MD 01/22/21 1443

## 2021-01-21 NOTE — H&P (Signed)
History and Physical:    Kristina NaBarbara A Merolla   ZOX:[id number] DOB: 12/29/1955 DOA: 01/21/2021  Referring MD/provider: Cecil CobbsPA Robinson PCP: Patient, No Pcp Per   Patient coming from: Home  Chief Complaint: Patient was found down during welfare check since her sister had not heard from her in 2 days.  History is per patient who is somewhat confused and chart review.  History of Present Illness:   Kristina Gates is an 65 y.o. female with PMH significant for HTN, DM 2, HL who was in her usual state of excellent health until about 4 to 5 days ago when she felt weak was not sure why she felt weak.  She states that she apparently had difficulty walking around and tried to rest.  She remembers at one point someone was knocking at the door but she was too tired to get it so did not.  She apparently fell but does not remember falling.  She does remember lying on the floor intermittently but has very little memory of the events.  She states that she texts her sister every morning and apparently her sister had not heard from her in 2 days so asked the police for a welfare check.  The police found her down on the floor covered in urine.  Patient is unable to provide a review of systems that she does not really remember much of what has been going on.  She does remember feeling very weak and tired.  He does not think she has had any fevers or chills but she is not sure.  Does not think she has had a cough or shortness of breath but is not sure.  ED Course:  The patient was noted to be acutely confused, normotensive with unlabored tachypnea.  T-max was 99.3.  She had a GCS of 14 when she came in.  Patient underwent extensive work-up which was notable for UTI, lactic acidosis, significant rhabdomyolysis with CPK of 26K, acute kidney injury.  Head CT was negative for any intracranial bleed or abnormality.  X-ray does reveal 4 consecutive rib fractures posteriorly without pneumothorax.  She was treated for sepsis with  aggressive fluid resuscitation and initiation of broad-spectrum antibiotics with vancomycin and cefepime and Flagyl.  Her mental status improved steadily with treatment of infection and with hydration.  However while she was being treated patient had acute episode of SVT with heart rate of 206.  This was aborted promptly with 6 mg of adenosine.  ROS:   ROS   Review of Systems: Really unable to provide is that she does not really remember exactly what was going on.  Past Medical History:   Past Medical History:  Diagnosis Date  . Diabetes mellitus without complication (HCC)   . Hypercholesteremia   . Hypertension   . Postprocedural hypothyroidism     Past Surgical History:   History reviewed. No pertinent surgical history.  Social History:   Social History   Socioeconomic History  . Marital status: Married    Spouse name: Not on file  . Number of children: Not on file  . Years of education: Not on file  . Highest education level: Not on file  Occupational History  . Not on file  Tobacco Use  . Smoking status: Never Smoker  . Smokeless tobacco: Never Used  Substance and Sexual Activity  . Alcohol use: Not on file  . Drug use: Not on file  . Sexual activity: Not on file  Other Topics Concern  .  Not on file  Social History Narrative  . Not on file   Social Determinants of Health   Financial Resource Strain: Not on file  Food Insecurity: Not on file  Transportation Needs: Not on file  Physical Activity: Not on file  Stress: Not on file  Social Connections: Not on file  Intimate Partner Violence: Not on file    Allergies   Metformin and related and Zestril [lisinopril]  Family history:   History reviewed. No pertinent family history.  Current Medications:   Prior to Admission medications   Medication Sig Start Date End Date Taking? Authorizing Provider  acetaminophen (TYLENOL) 325 MG tablet Take 650 mg by mouth every 6 (six) hours as needed for moderate  pain.   Yes [provider]  atenolol (TENORMIN) 50 MG tablet Take 50 mg by mouth daily. 01/19/21  Yes [provider]  atorvastatin (LIPITOR) 40 MG tablet Take 40 mg by mouth daily. 01/19/21  Yes [provider]  hydrochlorothiazide (HYDRODIURIL) 25 MG tablet Take 25 mg by mouth daily. 01/19/21  Yes [provider]  losartan (COZAAR) 100 MG tablet Take 100 mg by mouth daily. 01/19/21  Yes [provider]  Multiple Vitamin (MULTIVITAMIN WITH MINERALS) TABS tablet Take 1 tablet by mouth daily.   Yes [provider]  NOVOLOG MIX 70/30 FLEXPEN (70-30) 100 UNIT/ML FlexPen Inject 12 Units into the skin every evening. 11/25/20  Yes [provider]  sodium chloride (OCEAN) 0.65 % SOLN nasal spray Place 1 spray into both nostrils daily as needed for congestion.   Yes [provider]  SYNTHROID 100 MCG tablet Take 100 mcg by mouth daily. 01/19/21  Yes [provider]    Physical Exam:   Vitals:   01/21/21 1745 01/21/21 1800 01/21/21 1815 01/21/21 1830  BP: 107/75 129/89 (!) 163/115 136/85  Pulse: (!) 107 (!) 111 (!) 110 (!) 117  Resp: (!) 28 (!) 28 (!) 24 20  Temp:      TempSrc:      SpO2: 100% 100% 100% 100%  Weight:         Physical Exam: Blood pressure 136/85, pulse (!) 117, temperature 99.3 F (37.4 C), temperature source Oral, resp. rate 20, weight 80.3 kg, SpO2 100 %. Gen: Acutely ill-appearing female with some mild periorbital edema lying in bed appearing quite weak, somewhat tremulous and a little confused, she does have an intermittent cough. Eyes: sclera anicteric, conjuctiva mildly injected bilaterally CVS: S1-S2, regulary, no gallops Respiratory:  decreased air entry likely secondary to decreased inspiratory effort, few transmitted upper respiratory sounds. GI: NABS, soft, NT  LE: No edema. No cyanosis Neuro: grossly nonfocal, speaking coherently but is somewhat fuzzy with details. Skin: Significant redness on  her knees bilaterally with some abrasions and redness and elbows bilaterally.  No evidence of infection.   Data Review:    Labs: Basic Metabolic Panel: Recent Labs  Lab 01/21/21 1116 01/21/21 1708 01/21/21 1731  NA 132*  --  136  K 3.8 3.2* 3.1*  CL 98  --   --   CO2 15*  --   --   GLUCOSE 142*  --   --   BUN 29*  --   --   CREATININE 1.27*  --   --   CALCIUM 9.6  --   --   MG  --  1.5*  --   PHOS  --  3.1  --    Liver Function Tests: Recent Labs  Lab 01/21/21 1116  AST 494*  ALT 160*  ALKPHOS 58  BILITOT 1.8*  PROT 7.2  ALBUMIN 3.8   No results for input(s): LIPASE, AMYLASE in the last 168 hours. Recent Labs  Lab 01/21/21 1116  AMMONIA 25   CBC: Recent Labs  Lab 01/21/21 1116 01/21/21 1731  WBC 20.8*  --   NEUTROABS 18.2*  --   HGB 16.4* 12.2  HCT 46.0 36.0  MCV 91.3  --   PLT 188  --    Cardiac Enzymes: Recent Labs  Lab 01/21/21 1116  CKTOTAL 26,648*    BNP (last 3 results) No results for input(s): PROBNP in the last 8760 hours. CBG: Recent Labs  Lab 01/21/21 1112  GLUCAP 151*    Urinalysis    Component Value Date/Time   COLORURINE AMBER (A) 01/21/2021 1310   APPEARANCEUR CLOUDY (A) 01/21/2021 1310   LABSPEC 1.020 01/21/2021 1310   PHURINE 5.0 01/21/2021 1310   GLUCOSEU NEGATIVE 01/21/2021 1310   HGBUR LARGE (A) 01/21/2021 1310   BILIRUBINUR NEGATIVE 01/21/2021 1310   KETONESUR 5 (A) 01/21/2021 1310   PROTEINUR 100 (A) 01/21/2021 1310   NITRITE NEGATIVE 01/21/2021 1310   LEUKOCYTESUR TRACE (A) 01/21/2021 1310      Radiographic Studies: CT HEAD WO CONTRAST  Result Date: 01/21/2021 CLINICAL DATA:  Recent fall with headaches and facial pain, initial encounter EXAM: CT HEAD WITHOUT CONTRAST CT CERVICAL SPINE WITHOUT CONTRAST TECHNIQUE: Multidetector CT imaging of the head and cervical spine was performed following the standard protocol without intravenous contrast. Multiplanar CT image reconstructions of the cervical spine were  also generated. COMPARISON:  None. FINDINGS: CT HEAD FINDINGS Brain: No evidence of acute infarction, hemorrhage, hydrocephalus, extra-axial collection or mass lesion/mass effect. Vascular: No hyperdense vessel or unexpected calcification. Skull: Normal. Negative for fracture or focal lesion. Sinuses/Orbits: No acute finding. Other: Mild scalp hematoma is noted in the left posterior parietal region related to the recent injury. CT CERVICAL SPINE FINDINGS Alignment: Mild straightening of the normal cervical lordosis is noted likely related to muscular spasm. Skull base and vertebrae: 7 cervical segments are well visualized. Vertebral body height is well maintained. Mild osteophytic changes are noted primarily at C6-7 anteriorly. No acute fracture or acute facet abnormality is noted. The odontoid is within normal limits. Soft tissues and spinal canal: There is a 16 by 25 mm mildly hyperdense nodule identified in the posterior aspect of the left parotid gland. This was present on prior CT of the neck from 08/25/2020 and is somewhat smaller in size when compared with the prior exam. Upper chest: Visualized lung apices are within normal limits. Other: None IMPRESSION: CT of the head: Mild scalp hematoma in the left posterior parietal region. No acute intracranial abnormality noted. CT of the cervical spine: Mild degenerative change of the cervical spine without acute bony abnormality. Left parotid lesion somewhat smaller and less heterogeneous when compared with the prior exam as described. ENT referral is again recommended if this has not been performed. Electronically Signed   By: Alcide Clever M.D.   On: 01/21/2021 14:43   CT CHEST W CONTRAST  Result Date: 01/21/2021 CLINICAL DATA:  Larey Seat. Left rib fractures on a portable chest earlier today. Abdominal trauma. EXAM: CT CHEST, ABDOMEN, AND PELVIS WITH CONTRAST TECHNIQUE: Multidetector CT imaging of the chest, abdomen and pelvis was performed following the standard  protocol during bolus administration of intravenous contrast. CONTRAST:  OMNIPAQUE IOHEXOL 300 MG/ML  SOLN COMPARISON:  Portable chest obtained earlier today. FINDINGS: CT CHEST FINDINGS Cardiovascular:  Atheromatous calcifications, including the coronary arteries and aorta. Normal sized heart. Minimal pericardial effusion with a maximum thickness of 7 mm. Mediastinum/Nodes: Very small thyroid gland or thyroid remnant. No enlarged lymph nodes. Unremarkable esophagus. Lungs/Pleura: Minimal bilateral dependent atelectasis. Otherwise, clear lungs. No pneumothorax or pleural fluid. Musculoskeletal: Left posterolateral acute 4th through 8th rib fractures. Varying degrees of displacement. No spinal fractures or subluxations. The sternum is intact. Thoracic and lower cervical spine degenerative changes. Diffuse osteopenia. CT ABDOMEN PELVIS FINDINGS Hepatobiliary: No focal liver abnormality is seen. No gallstones, gallbladder wall thickening, or biliary dilatation. Pancreas: Unremarkable. No pancreatic ductal dilatation or surrounding inflammatory changes. Spleen: Normal in size without focal abnormality. Adrenals/Urinary Tract: Normal appearing adrenal glands. Small bilateral renal cysts. Unremarkable ureters and urinary bladder. Stomach/Bowel: Moderate diffuse low density wall thickening involving the gastric antrum, pylorus, duodenal bulb and 2nd portion of the duodenum. Minimal adjacent soft tissue stranding. Moderate to marked luminal narrowing. Unremarkable colon and appendix. Vascular/Lymphatic: Atheromatous arterial calcifications without aneurysm. No enlarged lymph nodes. Reproductive: Status post hysterectomy. No adnexal masses. Other: Small umbilical hernia containing fat. Multiple small supraumbilical and infraumbilical ventral hernias containing fat. Musculoskeletal: Lumbar spine degenerative changes. These include facet degenerative changes with associated grade 1 anterolisthesis at the L4-5 level. No  fractures, subluxations or dislocations. IMPRESSION: 1. Left posterolateral acute 4th through 8th rib fractures without pneumothorax. 2. Moderate diffuse low density wall thickening involving the gastric antrum, pylorus, duodenal bulb and 2nd portion of the duodenum with associated moderate to marked luminal narrowing. This is most likely due to gastritis and duodenitis. 3. Minimal pericardial effusion. 4. Multiple small supraumbilical and infraumbilical ventral hernias containing fat. 5. Calcific coronary artery and aortic atherosclerosis. Aortic Atherosclerosis (ICD10-I70.0). Electronically Signed   By: Beckie Salts M.D.   On: 01/21/2021 16:43   CT CERVICAL SPINE WO CONTRAST  Result Date: 01/21/2021 CLINICAL DATA:  Recent fall with headaches and facial pain, initial encounter EXAM: CT HEAD WITHOUT CONTRAST CT CERVICAL SPINE WITHOUT CONTRAST TECHNIQUE: Multidetector CT imaging of the head and cervical spine was performed following the standard protocol without intravenous contrast. Multiplanar CT image reconstructions of the cervical spine were also generated. COMPARISON:  None. FINDINGS: CT HEAD FINDINGS Brain: No evidence of acute infarction, hemorrhage, hydrocephalus, extra-axial collection or mass lesion/mass effect. Vascular: No hyperdense vessel or unexpected calcification. Skull: Normal. Negative for fracture or focal lesion. Sinuses/Orbits: No acute finding. Other: Mild scalp hematoma is noted in the left posterior parietal region related to the recent injury. CT CERVICAL SPINE FINDINGS Alignment: Mild straightening of the normal cervical lordosis is noted likely related to muscular spasm. Skull base and vertebrae: 7 cervical segments are well visualized. Vertebral body height is well maintained. Mild osteophytic changes are noted primarily at C6-7 anteriorly. No acute fracture or acute facet abnormality is noted. The odontoid is within normal limits. Soft tissues and spinal canal: There is a 16 by 25 mm  mildly hyperdense nodule identified in the posterior aspect of the left parotid gland. This was present on prior CT of the neck from 08/25/2020 and is somewhat smaller in size when compared with the prior exam. Upper chest: Visualized lung apices are within normal limits. Other: None IMPRESSION: CT of the head: Mild scalp hematoma in the left posterior parietal region. No acute intracranial abnormality noted. CT of the cervical spine: Mild degenerative change of the cervical spine without acute bony abnormality. Left parotid lesion somewhat smaller and less heterogeneous when compared with the prior exam as described. ENT referral is  again recommended if this has not been performed. Electronically Signed   By: Alcide Clever M.D.   On: 01/21/2021 14:43   CT ABDOMEN PELVIS W CONTRAST  Result Date: 01/21/2021 CLINICAL DATA:  Larey Seat. Left rib fractures on a portable chest earlier today. Abdominal trauma. EXAM: CT CHEST, ABDOMEN, AND PELVIS WITH CONTRAST TECHNIQUE: Multidetector CT imaging of the chest, abdomen and pelvis was performed following the standard protocol during bolus administration of intravenous contrast. CONTRAST:  OMNIPAQUE IOHEXOL 300 MG/ML  SOLN COMPARISON:  Portable chest obtained earlier today. FINDINGS: CT CHEST FINDINGS Cardiovascular: Atheromatous calcifications, including the coronary arteries and aorta. Normal sized heart. Minimal pericardial effusion with a maximum thickness of 7 mm. Mediastinum/Nodes: Very small thyroid gland or thyroid remnant. No enlarged lymph nodes. Unremarkable esophagus. Lungs/Pleura: Minimal bilateral dependent atelectasis. Otherwise, clear lungs. No pneumothorax or pleural fluid. Musculoskeletal: Left posterolateral acute 4th through 8th rib fractures. Varying degrees of displacement. No spinal fractures or subluxations. The sternum is intact. Thoracic and lower cervical spine degenerative changes. Diffuse osteopenia. CT ABDOMEN PELVIS FINDINGS Hepatobiliary: No  focal liver abnormality is seen. No gallstones, gallbladder wall thickening, or biliary dilatation. Pancreas: Unremarkable. No pancreatic ductal dilatation or surrounding inflammatory changes. Spleen: Normal in size without focal abnormality. Adrenals/Urinary Tract: Normal appearing adrenal glands. Small bilateral renal cysts. Unremarkable ureters and urinary bladder. Stomach/Bowel: Moderate diffuse low density wall thickening involving the gastric antrum, pylorus, duodenal bulb and 2nd portion of the duodenum. Minimal adjacent soft tissue stranding. Moderate to marked luminal narrowing. Unremarkable colon and appendix. Vascular/Lymphatic: Atheromatous arterial calcifications without aneurysm. No enlarged lymph nodes. Reproductive: Status post hysterectomy. No adnexal masses. Other: Small umbilical hernia containing fat. Multiple small supraumbilical and infraumbilical ventral hernias containing fat. Musculoskeletal: Lumbar spine degenerative changes. These include facet degenerative changes with associated grade 1 anterolisthesis at the L4-5 level. No fractures, subluxations or dislocations. IMPRESSION: 1. Left posterolateral acute 4th through 8th rib fractures without pneumothorax. 2. Moderate diffuse low density wall thickening involving the gastric antrum, pylorus, duodenal bulb and 2nd portion of the duodenum with associated moderate to marked luminal narrowing. This is most likely due to gastritis and duodenitis. 3. Minimal pericardial effusion. 4. Multiple small supraumbilical and infraumbilical ventral hernias containing fat. 5. Calcific coronary artery and aortic atherosclerosis. Aortic Atherosclerosis (ICD10-I70.0). Electronically Signed   By: Beckie Salts M.D.   On: 01/21/2021 16:43   DG Chest Port 1 View  Result Date: 01/21/2021 CLINICAL DATA:  Altered mental status following a fall today. EXAM: PORTABLE CHEST 1 VIEW COMPARISON:  None. FINDINGS: Normal sized heart. Mildly tortuous and minimally  calcified thoracic aorta. Minimal linear atelectasis or scarring in the left lower lung zone. Otherwise clear lungs with normal vascularity. Mildly displaced left 4th posterior rib fracture. Minimally displaced left 6th and 7th posterior rib fractures. No pneumothorax. IMPRESSION: Left 4th, 6th and 7th posterior rib fractures without pneumothorax. Electronically Signed   By: Beckie Salts M.D.   On: 01/21/2021 11:45    EKG: Independently reviewed.  EKG done at 16:56 today shows NSR at 95.  Normal intervals.  LAD at -60.  Isolated on ST elevations in V2, most likely J-point elevation with normal T wave contour.  EKG done at 16:46 shows SVT at 193.   Assessment/Plan:   Active Problems:   Sepsis [facility])  65 year old female was apparently down for anywhere from 2 to 4 days.  Work-up in ED reveals sepsis secondary to UTI, rhabdomyolysis, AKI.  Course was complicated by SVT which was aborted with  adenosine.   Found down for 2 to 4 days with subsequent rhabdomyolysis As noted above, patient was probably on the floor anywhere from 2 to 4 days with inability to get up. I suspect patient's weakness and fall was likely secondary to UTI/sepsis. CPK markedly elevated 26,000, transaminases are also elevated as is expected. Will treat with hydration, mobilize as is tolerated by patient PT and OT can be consulted in the morning if patient is up to it.  UTI/sepsis Patient has gotten aggressive fluid resuscitation in the ED Continue cefepime and vancomycin pending cultures Patient does have a cough, can consider repeating chest x-ray in the morning especially given multiple rib fractures.  Rib Fractures without PTX Left posterior lateral fourth through eighth rib fractures without pneumothorax Seen by trauma surgery who recommended conservative treatment with pain management and respiratory therapy to prevent atelectasis and/or pneumonia.  SVT Patient had sudden episode of SVT with heart rate at 219  while in the ED This was aborted with adenosine 6 mg IV and has not recurred Will add on troponin to labs although patient denied any chest pain during event Patient self denies any knowledge of previous SVT or palpitations Place patient on telemetry, supplement potassium and magnesium  Hypokalemia Potassium has fallen with hydration, now down to 3.1. K. Dur 40 mill globin p.o. x1 with 20 of K and IV fluids at 150 cc an hour  Hypomagnesemia Magnesium 1.5 on check, will replete with 4 g and recheck in the morning  AG acidosis Anion gap acidosis is most likely combination of acute renal failure and lactic acidosis This should resolve with treatment of infection and hydration Follow anion gap closely, I do not believe there is any concern for any ingestions  AKI Significantly improved with hydration, will continue gentle hydration  Erythema on knees and elbows This is most likely early pressure injury or ecchymoses I do not believe these are from cellulitis or infection especially as they are bilateral and symmetrical Patient was apparently either trying to crawl on her knees and elbows and or was kneeling for some part of the time causing these injuries. Treat with pain management and PT as she is able to tolerate  Gastritis Inflammation seen in the duodenum antrum and pylorus consistent with gastritis on CT Patient herself denies any GI complaints She is not on a PPI or H2 blocker at home, this can be started as warranted  DM2 Patient is on 12 units 70/30 at home, will hold while here and start patient on SSI AC at bedtime  Hypothyroidism Restart Synthroid  Left parotid gland lesion Heterogenous parotid gland lesion was seen on previous CT and is smaller but more diffuse on present CT.   It can be followed up as an outpatient   HTN Blood pressure is improved with hydration, restart atenolol tomorrow morning especially given SVT.   Continue to hold HCTZ and losartan until  patient is adequately fluid resuscitation given AKI.   Other information:   DVT prophylaxis: Enoxaparin ordered. Code Status: Full Family Communication: Spoke with patient's sister Ms Ward Disposition Plan: TBD Consults called: None Admission status: Inpatient   Zykeriah Mathia Tublu Mileigh Tilley [facility]  If 7PM-7AM, please contact night-coverage [email or web address] Password [facility] 01/21/2021, 6:40 PM
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
## 2021-01-21 NOTE — Sepsis Progress Note (Signed)
Notified bedside nurse of need to draw repeat lactic acid after fluid bolus is completed. 
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
## 2021-01-21 NOTE — Progress Notes (Addendum)
EKG done. Paged B. Morrison,NP. awaiting for call back. Recent V/S BP 110/56 RR18 T98.0 PR90 100%2L. Patient is alert and verbal, denies any chest pain/tightness. Call bell within reach and will continue to monitor.

## 2021-01-21 NOTE — Sepsis Progress Note (Signed)
elink monitoring code sepsis.  

## 2021-01-21 NOTE — Sepsis Progress Note (Signed)
Reported from bedside RN that lab misplaced the the initial lactic acid.  Bedside RN redrew and resent.

## 2021-01-21 NOTE — ED Notes (Signed)
Pt's HR noted to be 202 SVT, pt alert, oriented, denies any pain or shortness of breath.  Unable to perform vagal  maneuver d/t rib pain.  MD notified.  MD and PA to bedside. Pads placed on pt, suction set up, BVM at bedside. Pt given adenosine 6mg  IVP with 75ml NS IVP immediately following.  Pt alert throughout, tolerated well. Pt's HR returned to 105 ST.   EKGs obtained pre and post adenosine.

## 2021-01-21 NOTE — Progress Notes (Signed)
Patient arrived to [phone]. Alert and oriented x4, able to make needs known. Grimacing noted upon coughing, 10/10 pain score. PRN pain med given. Also educated how to use the Incentive spirometer using teach-back method, effective. No c/o DOB/SOB. 100%/2L. Patient is currently resting in bed comfortably with call bell within reach and will continue to monitor. V/S BP 128/59 T97.9 PR96 [id number]

## 2021-01-21 NOTE — Consult Note (Signed)
Trauma consult  HPI: Kristina Gates is an 65 y.o. female hypothyroidism, HLD - brought to ED after welfare check. She reports ~3 days ago she heard knocking on her door, went to answer and her legs 'gave out' and she collapsed on the floor. Attempted to get up and crawl but was unable to stand. Eventually got some strength and made it to bed, went to sleep. Awoke 2d ago, felt better. Was getting ready and collapsed again. This time crawled to couch where she reports she remained in a kneeling position against sofa. Her sister and her text each morning - she hadn't heard back so had [employer] do welfare check. Transported here.  She underwent evaluation in ED. Noted rhabdo, UTI, AKI, lactic acidosis with lactate of 6 which is since corrected. She underwent CT head/cspine/chest/abd/pelvis and was found to have multiple left sided rib fxs and we are asked to see.  Currently reports some mild confusion but feeling much better. Denies pain in her head, neck, abdomen, pelvis or any extremity. She does have some left sided chest pains with inspiration.  Past Medical History:  Diagnosis Date  . Diabetes mellitus without complication (HCC)   . Hypercholesteremia   . Hypertension   . Postprocedural hypothyroidism     History reviewed. No pertinent surgical history.  History reviewed. No pertinent family history.  Social:  reports that she has never smoked. She has never used smokeless tobacco. No history on file for alcohol use and drug use.  Allergies:  Allergies  Allergen Reactions  . Metformin And Related Cough  . Zestril [Lisinopril] Cough    Medications: I have reviewed the patient's current medications.  Results for orders placed or performed during the hospital encounter of 01/21/21 (from the past 48 hour(s))  CBG monitoring, ED     Status: Abnormal   Collection Time: 01/21/21 11:12 AM  Result Value Ref Range   Glucose-Capillary 151 (H) 70 - 99 mg/dL    Comment: Glucose reference  range applies only to samples taken after fasting for at least 8 hours.  Comprehensive metabolic panel     Status: Abnormal   Collection Time: 01/21/21 11:16 AM  Result Value Ref Range   Sodium 132 (L) 135 - 145 mmol/L   Potassium 3.8 3.5 - 5.1 mmol/L   Chloride 98 98 - 111 mmol/L   CO2 15 (L) 22 - 32 mmol/L   Glucose, Bld 142 (H) 70 - 99 mg/dL    Comment: Glucose reference range applies only to samples taken after fasting for at least 8 hours.   BUN 29 (H) 8 - 23 mg/dL   Creatinine, Ser 1.021.27 (H) 0.44 - 1.00 mg/dL   Calcium 9.6 8.9 - 72.510.3 mg/dL   Total Protein 7.2 6.5 - 8.1 g/dL   Albumin 3.8 3.5 - 5.0 g/dL   AST 366494 (H) 15 - 41 U/L   ALT 160 (H) 0 - 44 U/L   Alkaline Phosphatase 58 38 - 126 U/L   Total Bilirubin 1.8 (H) 0.3 - 1.2 mg/dL   GFR, Estimated 47 (L) >60 mL/min    Comment: (NOTE) Calculated using the CKD-EPI Creatinine Equation (2021)    Anion gap 19 (H) 5 - 15    Comment: Performed at [facility], [address] [place], [place] [address]  CBC WITH DIFFERENTIAL     Status: Abnormal   Collection Time: 01/21/21 11:16 AM  Result Value Ref Range   WBC 20.8 (H) 4.0 - 10.5 K/uL  RBC 5.04 3.87 - 5.11 MIL/uL   Hemoglobin 16.4 (H) 12.0 - 15.0 g/dL   HCT 44.0 10.2 - 72.5 %   MCV 91.3 80.0 - 100.0 fL   MCH 32.5 26.0 - 34.0 pg   MCHC 35.7 30.0 - 36.0 g/dL   RDW 36.6 44.0 - 34.7 %   Platelets 188 150 - 400 K/uL   nRBC 0.0 0.0 - 0.2 %   Neutrophils Relative % 87 %   Neutro Abs 18.2 (H) 1.7 - 7.7 K/uL   Lymphocytes Relative 6 %   Lymphs Abs 1.2 0.7 - 4.0 K/uL   Monocytes Relative 6 %   Monocytes Absolute 1.2 (H) 0.1 - 1.0 K/uL   Eosinophils Relative 0 %   Eosinophils Absolute 0.0 0.0 - 0.5 K/uL   Basophils Relative 0 %   Basophils Absolute 0.0 0.0 - 0.1 K/uL   Immature Granulocytes 1 %   Abs Immature Granulocytes 0.15 (H) 0.00 - 0.07 K/uL    Comment: Performed at [facility], [address] [place], [place] [address]  Ammonia     Status: None    Collection Time: 01/21/21 11:16 AM  Result Value Ref Range   Ammonia 25 9 - 35 umol/L    Comment: Performed at [facility], [address], [place], [place] [address]  Blood Cultures (routine x 2)     Status: None (Preliminary result)   Collection Time: 01/21/21 11:16 AM   Specimen: BLOOD  Result Value Ref Range   Specimen Description BLOOD SITE NOT SPECIFIED    Special Requests      BOTTLES DRAWN AEROBIC AND ANAEROBIC Blood Culture results may not be optimal due to an inadequate volume of blood received in culture bottles   Culture      NO GROWTH <12 HOURS Performed at [facility], [address], [place], [place] [address]    Report Status PENDING   CK     Status: Abnormal   Collection Time: 01/21/21 11:16 AM  Result Value Ref Range   Total CK 26,648 (H) 38 - 234 U/L    Comment: RESULTS CONFIRMED BY MANUAL DILUTION Performed at [facility], [address] [place], [place] [address]   TSH     Status: Abnormal   Collection Time: 01/21/21 11:16 AM  Result Value Ref Range   TSH 8.604 (H) 0.350 - 4.500 uIU/mL    Comment: Performed by a 3rd Generation assay with a functional sensitivity of <=0.01 uIU/mL. Performed at [facility], [address] [place], [place] [address]   Protime-INR     Status: None   Collection Time: 01/21/21 12:06 PM  Result Value Ref Range   Prothrombin Time 14.0 11.4 - 15.2 seconds   INR 1.1 0.8 - 1.2    Comment: (NOTE) INR goal varies based on device and disease states. Performed at [facility], [address], [place], [place] [address]   APTT     Status: None   Collection Time: 01/21/21 12:06 PM  Result Value Ref Range   aPTT 25 24 - 36 seconds    Comment: Performed at [facility], [address] [place], [place] [address]  Urinalysis, Complete w Microscopic Urine, Catheterized     Status: Abnormal   Collection Time: 01/21/21  1:10 PM  Result Value Ref Range   Color, Urine AMBER (A) YELLOW    Comment:  BIOCHEMICALS MAY BE AFFECTED BY COLOR  APPearance CLOUDY (A) CLEAR   Specific Gravity, Urine 1.020 1.005 - 1.030   pH 5.0 5.0 - 8.0   Glucose, UA NEGATIVE NEGATIVE mg/dL   Hgb urine dipstick LARGE (A) NEGATIVE   Bilirubin Urine NEGATIVE NEGATIVE   Ketones, ur 5 (A) NEGATIVE mg/dL   Protein, ur 161 (A) NEGATIVE mg/dL   Nitrite NEGATIVE NEGATIVE   Leukocytes,Ua TRACE (A) NEGATIVE   RBC / HPF 6-10 0 - 5 RBC/hpf   WBC, UA >50 (H) 0 - 5 WBC/hpf   Bacteria, UA MANY (A) NONE SEEN   Squamous Epithelial / LPF 0-5 0 - 5   Mucus PRESENT    Hyaline Casts, UA PRESENT    Granular Casts, UA PRESENT     Comment: Performed at [facility], [address] [place], [place] [address]  Lactic acid, plasma     Status: Abnormal   Collection Time: 01/21/21  1:53 PM  Result Value Ref Range   Lactic Acid, Venous 5.9 (HH) 0.5 - 1.9 mmol/L    Comment: CRITICAL RESULT CALLED TO, READ BACK BY AND VERIFIED WITH: BANKS,E RN  ON [id number] BY FLEMINGS Performed at [facility], [address], [place], [place] [address]   Lactic acid, plasma     Status: None   Collection Time: 01/21/21  3:52 PM  Result Value Ref Range   Lactic Acid, Venous 1.3 0.5 - 1.9 mmol/L    Comment: Performed at [facility], [address], [place], [place] [address]    CT HEAD WO CONTRAST  Result Date: 01/21/2021 CLINICAL DATA:  Recent fall with headaches and facial pain, initial encounter EXAM: CT HEAD WITHOUT CONTRAST CT CERVICAL SPINE WITHOUT CONTRAST TECHNIQUE: Multidetector CT imaging of the head and cervical spine was performed following the standard protocol without intravenous contrast. Multiplanar CT image reconstructions of the cervical spine were also generated. COMPARISON:  None. FINDINGS: CT HEAD FINDINGS Brain: No evidence of acute infarction, hemorrhage, hydrocephalus, extra-axial collection or mass lesion/mass effect. Vascular: No hyperdense vessel or unexpected calcification. Skull: Normal. Negative  for fracture or focal lesion. Sinuses/Orbits: No acute finding. Other: Mild scalp hematoma is noted in the left posterior parietal region related to the recent injury. CT CERVICAL SPINE FINDINGS Alignment: Mild straightening of the normal cervical lordosis is noted likely related to muscular spasm. Skull base and vertebrae: 7 cervical segments are well visualized. Vertebral body height is well maintained. Mild osteophytic changes are noted primarily at C6-7 anteriorly. No acute fracture or acute facet abnormality is noted. The odontoid is within normal limits. Soft tissues and spinal canal: There is a 16 by 25 mm mildly hyperdense nodule identified in the posterior aspect of the left parotid gland. This was present on prior CT of the neck from 08/25/2020 and is somewhat smaller in size when compared with the prior exam. Upper chest: Visualized lung apices are within normal limits. Other: None IMPRESSION: CT of the head: Mild scalp hematoma in the left posterior parietal region. No acute intracranial abnormality noted. CT of the cervical spine: Mild degenerative change of the cervical spine without acute bony abnormality. Left parotid lesion somewhat smaller and less heterogeneous when compared with the prior exam as described. ENT referral is again recommended if this has not been performed. Electronically Signed   By: Alcide Clever M.D.   On: 01/21/2021 14:43   CT CHEST W CONTRAST  Result Date: 01/21/2021 CLINICAL DATA:  Larey Seat. Left rib fractures on a portable chest earlier today. Abdominal  trauma. EXAM: CT CHEST, ABDOMEN, AND PELVIS WITH CONTRAST TECHNIQUE: Multidetector CT imaging of the chest, abdomen and pelvis was performed following the standard protocol during bolus administration of intravenous contrast. CONTRAST:  OMNIPAQUE IOHEXOL 300 MG/ML  SOLN COMPARISON:  Portable chest obtained earlier today. FINDINGS: CT CHEST FINDINGS Cardiovascular: Atheromatous calcifications, including the coronary  arteries and aorta. Normal sized heart. Minimal pericardial effusion with a maximum thickness of 7 mm. Mediastinum/Nodes: Very small thyroid gland or thyroid remnant. No enlarged lymph nodes. Unremarkable esophagus. Lungs/Pleura: Minimal bilateral dependent atelectasis. Otherwise, clear lungs. No pneumothorax or pleural fluid. Musculoskeletal: Left posterolateral acute 4th through 8th rib fractures. Varying degrees of displacement. No spinal fractures or subluxations. The sternum is intact. Thoracic and lower cervical spine degenerative changes. Diffuse osteopenia. CT ABDOMEN PELVIS FINDINGS Hepatobiliary: No focal liver abnormality is seen. No gallstones, gallbladder wall thickening, or biliary dilatation. Pancreas: Unremarkable. No pancreatic ductal dilatation or surrounding inflammatory changes. Spleen: Normal in size without focal abnormality. Adrenals/Urinary Tract: Normal appearing adrenal glands. Small bilateral renal cysts. Unremarkable ureters and urinary bladder. Stomach/Bowel: Moderate diffuse low density wall thickening involving the gastric antrum, pylorus, duodenal bulb and 2nd portion of the duodenum. Minimal adjacent soft tissue stranding. Moderate to marked luminal narrowing. Unremarkable colon and appendix. Vascular/Lymphatic: Atheromatous arterial calcifications without aneurysm. No enlarged lymph nodes. Reproductive: Status post hysterectomy. No adnexal masses. Other: Small umbilical hernia containing fat. Multiple small supraumbilical and infraumbilical ventral hernias containing fat. Musculoskeletal: Lumbar spine degenerative changes. These include facet degenerative changes with associated grade 1 anterolisthesis at the L4-5 level. No fractures, subluxations or dislocations. IMPRESSION: 1. Left posterolateral acute 4th through 8th rib fractures without pneumothorax. 2. Moderate diffuse low density wall thickening involving the gastric antrum, pylorus, duodenal bulb and 2nd portion of the  duodenum with associated moderate to marked luminal narrowing. This is most likely due to gastritis and duodenitis. 3. Minimal pericardial effusion. 4. Multiple small supraumbilical and infraumbilical ventral hernias containing fat. 5. Calcific coronary artery and aortic atherosclerosis. Aortic Atherosclerosis (ICD10-I70.0). Electronically Signed   By: Beckie Salts M.D.   On: 01/21/2021 16:43   CT CERVICAL SPINE WO CONTRAST  Result Date: 01/21/2021 CLINICAL DATA:  Recent fall with headaches and facial pain, initial encounter EXAM: CT HEAD WITHOUT CONTRAST CT CERVICAL SPINE WITHOUT CONTRAST TECHNIQUE: Multidetector CT imaging of the head and cervical spine was performed following the standard protocol without intravenous contrast. Multiplanar CT image reconstructions of the cervical spine were also generated. COMPARISON:  None. FINDINGS: CT HEAD FINDINGS Brain: No evidence of acute infarction, hemorrhage, hydrocephalus, extra-axial collection or mass lesion/mass effect. Vascular: No hyperdense vessel or unexpected calcification. Skull: Normal. Negative for fracture or focal lesion. Sinuses/Orbits: No acute finding. Other: Mild scalp hematoma is noted in the left posterior parietal region related to the recent injury. CT CERVICAL SPINE FINDINGS Alignment: Mild straightening of the normal cervical lordosis is noted likely related to muscular spasm. Skull base and vertebrae: 7 cervical segments are well visualized. Vertebral body height is well maintained. Mild osteophytic changes are noted primarily at C6-7 anteriorly. No acute fracture or acute facet abnormality is noted. The odontoid is within normal limits. Soft tissues and spinal canal: There is a 16 by 25 mm mildly hyperdense nodule identified in the posterior aspect of the left parotid gland. This was present on prior CT of the neck from 08/25/2020 and is somewhat smaller in size when compared with the prior exam. Upper chest: Visualized lung apices are within  normal limits. Other: None IMPRESSION:  CT of the head: Mild scalp hematoma in the left posterior parietal region. No acute intracranial abnormality noted. CT of the cervical spine: Mild degenerative change of the cervical spine without acute bony abnormality. Left parotid lesion somewhat smaller and less heterogeneous when compared with the prior exam as described. ENT referral is again recommended if this has not been performed. Electronically Signed   By: Alcide Clever M.D.   On: 01/21/2021 14:43   CT ABDOMEN PELVIS W CONTRAST  Result Date: 01/21/2021 CLINICAL DATA:  Larey Seat. Left rib fractures on a portable chest earlier today. Abdominal trauma. EXAM: CT CHEST, ABDOMEN, AND PELVIS WITH CONTRAST TECHNIQUE: Multidetector CT imaging of the chest, abdomen and pelvis was performed following the standard protocol during bolus administration of intravenous contrast. CONTRAST:  OMNIPAQUE IOHEXOL 300 MG/ML  SOLN COMPARISON:  Portable chest obtained earlier today. FINDINGS: CT CHEST FINDINGS Cardiovascular: Atheromatous calcifications, including the coronary arteries and aorta. Normal sized heart. Minimal pericardial effusion with a maximum thickness of 7 mm. Mediastinum/Nodes: Very small thyroid gland or thyroid remnant. No enlarged lymph nodes. Unremarkable esophagus. Lungs/Pleura: Minimal bilateral dependent atelectasis. Otherwise, clear lungs. No pneumothorax or pleural fluid. Musculoskeletal: Left posterolateral acute 4th through 8th rib fractures. Varying degrees of displacement. No spinal fractures or subluxations. The sternum is intact. Thoracic and lower cervical spine degenerative changes. Diffuse osteopenia. CT ABDOMEN PELVIS FINDINGS Hepatobiliary: No focal liver abnormality is seen. No gallstones, gallbladder wall thickening, or biliary dilatation. Pancreas: Unremarkable. No pancreatic ductal dilatation or surrounding inflammatory changes. Spleen: Normal in size without focal abnormality. Adrenals/Urinary  Tract: Normal appearing adrenal glands. Small bilateral renal cysts. Unremarkable ureters and urinary bladder. Stomach/Bowel: Moderate diffuse low density wall thickening involving the gastric antrum, pylorus, duodenal bulb and 2nd portion of the duodenum. Minimal adjacent soft tissue stranding. Moderate to marked luminal narrowing. Unremarkable colon and appendix. Vascular/Lymphatic: Atheromatous arterial calcifications without aneurysm. No enlarged lymph nodes. Reproductive: Status post hysterectomy. No adnexal masses. Other: Small umbilical hernia containing fat. Multiple small supraumbilical and infraumbilical ventral hernias containing fat. Musculoskeletal: Lumbar spine degenerative changes. These include facet degenerative changes with associated grade 1 anterolisthesis at the L4-5 level. No fractures, subluxations or dislocations. IMPRESSION: 1. Left posterolateral acute 4th through 8th rib fractures without pneumothorax. 2. Moderate diffuse low density wall thickening involving the gastric antrum, pylorus, duodenal bulb and 2nd portion of the duodenum with associated moderate to marked luminal narrowing. This is most likely due to gastritis and duodenitis. 3. Minimal pericardial effusion. 4. Multiple small supraumbilical and infraumbilical ventral hernias containing fat. 5. Calcific coronary artery and aortic atherosclerosis. Aortic Atherosclerosis (ICD10-I70.0). Electronically Signed   By: Beckie Salts M.D.   On: 01/21/2021 16:43   DG Chest Port 1 View  Result Date: 01/21/2021 CLINICAL DATA:  Altered mental status following a fall today. EXAM: PORTABLE CHEST 1 VIEW COMPARISON:  None. FINDINGS: Normal sized heart. Mildly tortuous and minimally calcified thoracic aorta. Minimal linear atelectasis or scarring in the left lower lung zone. Otherwise clear lungs with normal vascularity. Mildly displaced left 4th posterior rib fracture. Minimally displaced left 6th and 7th posterior rib fractures. No  pneumothorax. IMPRESSION: Left 4th, 6th and 7th posterior rib fractures without pneumothorax. Electronically Signed   By: Beckie Salts M.D.   On: 01/21/2021 11:45    ROS - All of the below systems have been reviewed with the patient and positives are indicated with bold text General: chills, fever or night sweats Eyes: blurry vision or double vision ENT: epistaxis or sore throat  Allergy/Immunology: itchy/watery eyes or nasal congestion Hematologic/Lymphatic: bleeding problems, blood clots or swollen lymph nodes Endocrine: temperature intolerance or unexpected weight changes Breast: new or changing breast lumps or nipple discharge Resp: cough, shortness of breath, or wheezing CV: chest pain (as per HPI) or dyspnea on exertion GI: nausea, vomiting, changes in bowel habits GU: dysuria, trouble voiding, or hematuria MSK: joint pain or joint stiffness Neuro: TIA or stroke symptoms Derm: pruritus and skin lesion changes Psych: anxiety and depression  PE Blood pressure (!) 113/49, pulse (!) 105, temperature 99.3 F (37.4 C), temperature source Oral, resp. rate (!) 22, weight 80.3 kg, SpO2 100 %. Physical Exam Constitutional: NAD; conversant; no deformities Eyes: Moist conjunctiva; no lid lag; anicteric; PERRL Neck: Trachea midline; no thyromegaly Lungs: Normal respiratory effort; CTAB; no tactile fremitus CV: tachycardic rate (101), reg rhythm; no palpable thrills; no pitting edema GI: Abd soft, NT/ND; no palpable hepatosplenomegaly MSK: Normal range of motion of extremities; no clubbing/cyanosis; no deformities. +Stage I pressure injury to both knees; Skin breakdown on both elbows consistent with pressure related injury as well Psychiatric: Appropriate affect; alert and oriented to person and place, thought it was still end of February Lymphatic: No palpable cervical or axillary lymphadenopathy  Results for orders placed or performed during the hospital encounter of 01/21/21 (from the  past 48 hour(s))  CBG monitoring, ED     Status: Abnormal   Collection Time: 01/21/21 11:12 AM  Result Value Ref Range   Glucose-Capillary 151 (H) 70 - 99 mg/dL    Comment: Glucose reference range applies only to samples taken after fasting for at least 8 hours.  Comprehensive metabolic panel     Status: Abnormal   Collection Time: 01/21/21 11:16 AM  Result Value Ref Range   Sodium 132 (L) 135 - 145 mmol/L   Potassium 3.8 3.5 - 5.1 mmol/L   Chloride 98 98 - 111 mmol/L   CO2 15 (L) 22 - 32 mmol/L   Glucose, Bld 142 (H) 70 - 99 mg/dL    Comment: Glucose reference range applies only to samples taken after fasting for at least 8 hours.   BUN 29 (H) 8 - 23 mg/dL   Creatinine, Ser 4.53 (H) 0.44 - 1.00 mg/dL   Calcium 9.6 8.9 - 64.6 mg/dL   Total Protein 7.2 6.5 - 8.1 g/dL   Albumin 3.8 3.5 - 5.0 g/dL   AST 803 (H) 15 - 41 U/L   ALT 160 (H) 0 - 44 U/L   Alkaline Phosphatase 58 38 - 126 U/L   Total Bilirubin 1.8 (H) 0.3 - 1.2 mg/dL   GFR, Estimated 47 (L) >60 mL/min    Comment: (NOTE) Calculated using the CKD-EPI Creatinine Equation (2021)    Anion gap 19 (H) 5 - 15    Comment: Performed at [facility], [address], [place], [place] [address]  CBC WITH DIFFERENTIAL     Status: Abnormal   Collection Time: 01/21/21 11:16 AM  Result Value Ref Range   WBC 20.8 (H) 4.0 - 10.5 K/uL   RBC 5.04 3.87 - 5.11 MIL/uL   Hemoglobin 16.4 (H) 12.0 - 15.0 g/dL   HCT 82.5 00.3 - 70.4 %   MCV 91.3 80.0 - 100.0 fL   MCH 32.5 26.0 - 34.0 pg   MCHC 35.7 30.0 - 36.0 g/dL   RDW 88.8 91.6 - 94.5 %   Platelets 188 150 - 400 K/uL   nRBC 0.0 0.0 - 0.2 %   Neutrophils Relative %  87 %   Neutro Abs 18.2 (H) 1.7 - 7.7 K/uL   Lymphocytes Relative 6 %   Lymphs Abs 1.2 0.7 - 4.0 K/uL   Monocytes Relative 6 %   Monocytes Absolute 1.2 (H) 0.1 - 1.0 K/uL   Eosinophils Relative 0 %   Eosinophils Absolute 0.0 0.0 - 0.5 K/uL   Basophils Relative 0 %   Basophils Absolute 0.0 0.0 - 0.1 K/uL   Immature  Granulocytes 1 %   Abs Immature Granulocytes 0.15 (H) 0.00 - 0.07 K/uL    Comment: Performed at [facility], [address], [place], [place] [address]  Ammonia     Status: None   Collection Time: 01/21/21 11:16 AM  Result Value Ref Range   Ammonia 25 9 - 35 umol/L    Comment: Performed at [facility], [address] [place], [place] [address]  Blood Cultures (routine x 2)     Status: None (Preliminary result)   Collection Time: 01/21/21 11:16 AM   Specimen: BLOOD  Result Value Ref Range   Specimen Description BLOOD SITE NOT SPECIFIED    Special Requests      BOTTLES DRAWN AEROBIC AND ANAEROBIC Blood Culture results may not be optimal due to an inadequate volume of blood received in culture bottles   Culture      NO GROWTH <12 HOURS Performed at [facility], [address], [place], [place] [address]    Report Status PENDING   CK     Status: Abnormal   Collection Time: 01/21/21 11:16 AM  Result Value Ref Range   Total CK 26,648 (H) 38 - 234 U/L    Comment: RESULTS CONFIRMED BY MANUAL DILUTION Performed at [facility], [address], [place], [place] [address]   TSH     Status: Abnormal   Collection Time: 01/21/21 11:16 AM  Result Value Ref Range   TSH 8.604 (H) 0.350 - 4.500 uIU/mL    Comment: Performed by a 3rd Generation assay with a functional sensitivity of <=0.01 uIU/mL. Performed at [facility], [address], [place], [place] [address]   Protime-INR     Status: None   Collection Time: 01/21/21 12:06 PM  Result Value Ref Range   Prothrombin Time 14.0 11.4 - 15.2 seconds   INR 1.1 0.8 - 1.2    Comment: (NOTE) INR goal varies based on device and disease states. Performed at [facility], [address] [place], [place] [address]   APTT     Status: None   Collection Time: 01/21/21 12:06 PM  Result Value Ref Range   aPTT 25 24 - 36 seconds    Comment: Performed at [facility], [address] [place], [place]  [address]  Urinalysis, Complete w Microscopic Urine, Catheterized     Status: Abnormal   Collection Time: 01/21/21  1:10 PM  Result Value Ref Range   Color, Urine AMBER (A) YELLOW    Comment: BIOCHEMICALS MAY BE AFFECTED BY COLOR   APPearance CLOUDY (A) CLEAR   Specific Gravity, Urine 1.020 1.005 - 1.030   pH 5.0 5.0 - 8.0   Glucose, UA NEGATIVE NEGATIVE mg/dL   Hgb urine dipstick LARGE (A) NEGATIVE   Bilirubin Urine NEGATIVE NEGATIVE   Ketones, ur 5 (A) NEGATIVE mg/dL   Protein, ur 962 (A) NEGATIVE mg/dL   Nitrite NEGATIVE NEGATIVE   Leukocytes,Ua TRACE (A) NEGATIVE   RBC / HPF 6-10 0 -  5 RBC/hpf   WBC, UA >50 (H) 0 - 5 WBC/hpf   Bacteria, UA MANY (A) NONE SEEN   Squamous Epithelial / LPF 0-5 0 - 5   Mucus PRESENT    Hyaline Casts, UA PRESENT    Granular Casts, UA PRESENT     Comment: Performed at [facility], [address], [place], [place] [address]  Lactic acid, plasma     Status: Abnormal   Collection Time: 01/21/21  1:53 PM  Result Value Ref Range   Lactic Acid, Venous 5.9 (HH) 0.5 - 1.9 mmol/L    Comment: CRITICAL RESULT CALLED TO, READ BACK BY AND VERIFIED WITH: BANKS,E RN @1458  ON [id number] BY FLEMINGS Performed at [facility], [address], [place], [place] [address]   Lactic acid, plasma     Status: None   Collection Time: 01/21/21  3:52 PM  Result Value Ref Range   Lactic Acid, Venous 1.3 0.5 - 1.9 mmol/L    Comment: Performed at [facility], [address] [place], [place] [address]    CT HEAD WO CONTRAST  Result Date: 01/21/2021 CLINICAL DATA:  Recent fall with headaches and facial pain, initial encounter EXAM: CT HEAD WITHOUT CONTRAST CT CERVICAL SPINE WITHOUT CONTRAST TECHNIQUE: Multidetector CT imaging of the head and cervical spine was performed following the standard protocol without intravenous contrast. Multiplanar CT image reconstructions of the cervical spine were also generated. COMPARISON:  None. FINDINGS: CT HEAD FINDINGS Brain: No  evidence of acute infarction, hemorrhage, hydrocephalus, extra-axial collection or mass lesion/mass effect. Vascular: No hyperdense vessel or unexpected calcification. Skull: Normal. Negative for fracture or focal lesion. Sinuses/Orbits: No acute finding. Other: Mild scalp hematoma is noted in the left posterior parietal region related to the recent injury. CT CERVICAL SPINE FINDINGS Alignment: Mild straightening of the normal cervical lordosis is noted likely related to muscular spasm. Skull base and vertebrae: 7 cervical segments are well visualized. Vertebral body height is well maintained. Mild osteophytic changes are noted primarily at C6-7 anteriorly. No acute fracture or acute facet abnormality is noted. The odontoid is within normal limits. Soft tissues and spinal canal: There is a 16 by 25 mm mildly hyperdense nodule identified in the posterior aspect of the left parotid gland. This was present on prior CT of the neck from 08/25/2020 and is somewhat smaller in size when compared with the prior exam. Upper chest: Visualized lung apices are within normal limits. Other: None IMPRESSION: CT of the head: Mild scalp hematoma in the left posterior parietal region. No acute intracranial abnormality noted. CT of the cervical spine: Mild degenerative change of the cervical spine without acute bony abnormality. Left parotid lesion somewhat smaller and less heterogeneous when compared with the prior exam as described. ENT referral is again recommended if this has not been performed. Electronically Signed   By: Alcide Clever M.D.   On: 01/21/2021 14:43   CT CHEST W CONTRAST  Result Date: 01/21/2021 CLINICAL DATA:  Larey Seat. Left rib fractures on a portable chest earlier today. Abdominal trauma. EXAM: CT CHEST, ABDOMEN, AND PELVIS WITH CONTRAST TECHNIQUE: Multidetector CT imaging of the chest, abdomen and pelvis was performed following the standard protocol during bolus administration of intravenous contrast. CONTRAST:   OMNIPAQUE IOHEXOL 300 MG/ML  SOLN COMPARISON:  Portable chest obtained earlier today. FINDINGS: CT CHEST FINDINGS Cardiovascular: Atheromatous calcifications, including the coronary arteries and aorta. Normal sized heart. Minimal pericardial effusion with a maximum thickness of 7 mm. Mediastinum/Nodes: Very  small thyroid gland or thyroid remnant. No enlarged lymph nodes. Unremarkable esophagus. Lungs/Pleura: Minimal bilateral dependent atelectasis. Otherwise, clear lungs. No pneumothorax or pleural fluid. Musculoskeletal: Left posterolateral acute 4th through 8th rib fractures. Varying degrees of displacement. No spinal fractures or subluxations. The sternum is intact. Thoracic and lower cervical spine degenerative changes. Diffuse osteopenia. CT ABDOMEN PELVIS FINDINGS Hepatobiliary: No focal liver abnormality is seen. No gallstones, gallbladder wall thickening, or biliary dilatation. Pancreas: Unremarkable. No pancreatic ductal dilatation or surrounding inflammatory changes. Spleen: Normal in size without focal abnormality. Adrenals/Urinary Tract: Normal appearing adrenal glands. Small bilateral renal cysts. Unremarkable ureters and urinary bladder. Stomach/Bowel: Moderate diffuse low density wall thickening involving the gastric antrum, pylorus, duodenal bulb and 2nd portion of the duodenum. Minimal adjacent soft tissue stranding. Moderate to marked luminal narrowing. Unremarkable colon and appendix. Vascular/Lymphatic: Atheromatous arterial calcifications without aneurysm. No enlarged lymph nodes. Reproductive: Status post hysterectomy. No adnexal masses. Other: Small umbilical hernia containing fat. Multiple small supraumbilical and infraumbilical ventral hernias containing fat. Musculoskeletal: Lumbar spine degenerative changes. These include facet degenerative changes with associated grade 1 anterolisthesis at the L4-5 level. No fractures, subluxations or dislocations. IMPRESSION: 1. Left posterolateral  acute 4th through 8th rib fractures without pneumothorax. 2. Moderate diffuse low density wall thickening involving the gastric antrum, pylorus, duodenal bulb and 2nd portion of the duodenum with associated moderate to marked luminal narrowing. This is most likely due to gastritis and duodenitis. 3. Minimal pericardial effusion. 4. Multiple small supraumbilical and infraumbilical ventral hernias containing fat. 5. Calcific coronary artery and aortic atherosclerosis. Aortic Atherosclerosis (ICD10-I70.0). Electronically Signed   By: Beckie Salts M.D.   On: 01/21/2021 16:43   CT CERVICAL SPINE WO CONTRAST  Result Date: 01/21/2021 CLINICAL DATA:  Recent fall with headaches and facial pain, initial encounter EXAM: CT HEAD WITHOUT CONTRAST CT CERVICAL SPINE WITHOUT CONTRAST TECHNIQUE: Multidetector CT imaging of the head and cervical spine was performed following the standard protocol without intravenous contrast. Multiplanar CT image reconstructions of the cervical spine were also generated. COMPARISON:  None. FINDINGS: CT HEAD FINDINGS Brain: No evidence of acute infarction, hemorrhage, hydrocephalus, extra-axial collection or mass lesion/mass effect. Vascular: No hyperdense vessel or unexpected calcification. Skull: Normal. Negative for fracture or focal lesion. Sinuses/Orbits: No acute finding. Other: Mild scalp hematoma is noted in the left posterior parietal region related to the recent injury. CT CERVICAL SPINE FINDINGS Alignment: Mild straightening of the normal cervical lordosis is noted likely related to muscular spasm. Skull base and vertebrae: 7 cervical segments are well visualized. Vertebral body height is well maintained. Mild osteophytic changes are noted primarily at C6-7 anteriorly. No acute fracture or acute facet abnormality is noted. The odontoid is within normal limits. Soft tissues and spinal canal: There is a 16 by 25 mm mildly hyperdense nodule identified in the posterior aspect of the left  parotid gland. This was present on prior CT of the neck from 08/25/2020 and is somewhat smaller in size when compared with the prior exam. Upper chest: Visualized lung apices are within normal limits. Other: None IMPRESSION: CT of the head: Mild scalp hematoma in the left posterior parietal region. No acute intracranial abnormality noted. CT of the cervical spine: Mild degenerative change of the cervical spine without acute bony abnormality. Left parotid lesion somewhat smaller and less heterogeneous when compared with the prior exam as described. ENT referral is again recommended if this has not been performed. Electronically Signed   By: Alcide Clever M.D.   On: 01/21/2021 14:43  CT ABDOMEN PELVIS W CONTRAST  Result Date: 01/21/2021 CLINICAL DATA:  Larey Seat. Left rib fractures on a portable chest earlier today. Abdominal trauma. EXAM: CT CHEST, ABDOMEN, AND PELVIS WITH CONTRAST TECHNIQUE: Multidetector CT imaging of the chest, abdomen and pelvis was performed following the standard protocol during bolus administration of intravenous contrast. CONTRAST:  OMNIPAQUE IOHEXOL 300 MG/ML  SOLN COMPARISON:  Portable chest obtained earlier today. FINDINGS: CT CHEST FINDINGS Cardiovascular: Atheromatous calcifications, including the coronary arteries and aorta. Normal sized heart. Minimal pericardial effusion with a maximum thickness of 7 mm. Mediastinum/Nodes: Very small thyroid gland or thyroid remnant. No enlarged lymph nodes. Unremarkable esophagus. Lungs/Pleura: Minimal bilateral dependent atelectasis. Otherwise, clear lungs. No pneumothorax or pleural fluid. Musculoskeletal: Left posterolateral acute 4th through 8th rib fractures. Varying degrees of displacement. No spinal fractures or subluxations. The sternum is intact. Thoracic and lower cervical spine degenerative changes. Diffuse osteopenia. CT ABDOMEN PELVIS FINDINGS Hepatobiliary: No focal liver abnormality is seen. No gallstones, gallbladder wall  thickening, or biliary dilatation. Pancreas: Unremarkable. No pancreatic ductal dilatation or surrounding inflammatory changes. Spleen: Normal in size without focal abnormality. Adrenals/Urinary Tract: Normal appearing adrenal glands. Small bilateral renal cysts. Unremarkable ureters and urinary bladder. Stomach/Bowel: Moderate diffuse low density wall thickening involving the gastric antrum, pylorus, duodenal bulb and 2nd portion of the duodenum. Minimal adjacent soft tissue stranding. Moderate to marked luminal narrowing. Unremarkable colon and appendix. Vascular/Lymphatic: Atheromatous arterial calcifications without aneurysm. No enlarged lymph nodes. Reproductive: Status post hysterectomy. No adnexal masses. Other: Small umbilical hernia containing fat. Multiple small supraumbilical and infraumbilical ventral hernias containing fat. Musculoskeletal: Lumbar spine degenerative changes. These include facet degenerative changes with associated grade 1 anterolisthesis at the L4-5 level. No fractures, subluxations or dislocations. IMPRESSION: 1. Left posterolateral acute 4th through 8th rib fractures without pneumothorax. 2. Moderate diffuse low density wall thickening involving the gastric antrum, pylorus, duodenal bulb and 2nd portion of the duodenum with associated moderate to marked luminal narrowing. This is most likely due to gastritis and duodenitis. 3. Minimal pericardial effusion. 4. Multiple small supraumbilical and infraumbilical ventral hernias containing fat. 5. Calcific coronary artery and aortic atherosclerosis. Aortic Atherosclerosis (ICD10-I70.0). Electronically Signed   By: Beckie Salts M.D.   On: 01/21/2021 16:43   DG Chest Port 1 View  Result Date: 01/21/2021 CLINICAL DATA:  Altered mental status following a fall today. EXAM: PORTABLE CHEST 1 VIEW COMPARISON:  None. FINDINGS: Normal sized heart. Mildly tortuous and minimally calcified thoracic aorta. Minimal linear atelectasis or scarring in the  left lower lung zone. Otherwise clear lungs with normal vascularity. Mildly displaced left 4th posterior rib fracture. Minimally displaced left 6th and 7th posterior rib fractures. No pneumothorax. IMPRESSION: Left 4th, 6th and 7th posterior rib fractures without pneumothorax. Electronically Signed   By: Beckie Salts M.D.   On: 01/21/2021 11:45   Assessment/Plan: 64yoF with HLD, hypothyroidism s/p presumably UTI --> fall at home   Rhabdomyolysis/AKI/UTI - as per medicine Probable pressure injuries to both knees and elbows present in ER - WOCN consult; turn patient q2 hrs for now L rib fxs 4-8 - multimodal pain control; incentive spirometry 10x/hr while awake  We will follow with you  Marin Olp, MD [facility] Use [email or web address] to contact on call provider

## 2021-01-21 NOTE — ED Provider Notes (Incomplete)
[facility] EMERGENCY DEPARTMENT Provider Note   CSN: [id number] Arrival date & time: 01/21/21  1048     History Chief Complaint  Patient presents with  . Altered Mental Status  . Fall    Kristina Gates is a 65 y.o. female With a past medical history of hypertension, hyperlipidemia, postprocedural hypothyroidism, and diabetes.  EMS reports that they were called out to the patient's house today because family had not been able to get in touch with the patient for about 4 days.  Patient was found facedown on the ground covered in urine.  She complained of pain in her shoulders and legs when she was moved onto the gurney.  EMS reports that her vital signs were stable, blood sugar of 186.  Patient does not remember being found on the ground today and states that she has not had any injuries in does not know what happened.  There is a level 5 caveat due to altered mental status.  HPI     Past Medical History:  Diagnosis Date  . Diabetes mellitus without complication (HCC)   . Hypercholesteremia   . Hypertension   . Postprocedural hypothyroidism     Patient Active Problem List   Diagnosis Date Noted  . Essential hypertension 01/21/2021  . Hyperglycemia due to type 2 diabetes mellitus (HCC) 01/21/2021  . 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/2021  . Pure hypercholesterolemia 01/21/2021   ,[facility], Surgical Hx, Social Hx unable to be reviewed at this time due to altered mentation.  OB History   No obstetric history on file.     No family history on file.     Home Medications Prior to Admission medications   Not on File    Allergies    Patient has no allergy information on record.  Review of Systems   Review of Systems  Unable to perform ROS: Mental status change    Physical Exam Updated Vital Signs There were no vitals taken for this visit.  Physical Exam Vitals and nursing note reviewed.  Constitutional:      General: She is not in acute  distress.    Appearance: She is well-developed and well-nourished. She is not diaphoretic.  HENT:     Head: Normocephalic.     Comments: Large contusion over the forehead, TTP    Nose:     Comments: Crusted nasal congestion    Mouth/Throat:     Mouth: Mucous membranes are dry.  Eyes:     General: No scleral icterus.    Extraocular Movements: Extraocular movements intact.     Conjunctiva/sclera: Conjunctivae normal.     Pupils: Pupils are equal, round, and reactive to light.  Cardiovascular:     Rate and Rhythm: Normal rate and regular rhythm.     Heart sounds: Normal heart sounds. No murmur heard. No friction rub. No gallop.   Pulmonary:     Effort: Pulmonary effort is normal. No respiratory distress.     Breath sounds: Normal breath sounds.  Abdominal:     General: Bowel sounds are normal. There is no distension.     Palpations: Abdomen is soft. There is no mass.     Tenderness: There is no abdominal tenderness. There is no guarding.     Comments: Right upper quadrant surgical scar, midline lower abdominal surgical scar present  Musculoskeletal:     Cervical back: Normal range of motion.     Comments: Bilateral shoulder pain, difficulty lifting the shoulders off the table.  Weak but  able to move both lower extremities against gravity.  No pelvic instability. No midline spinal tenderness, step-off or crepitus.  Skin:    General: Skin is warm and dry.     Comments: Multiple areas of bruising over the bilateral knees, right hip and buttock, bilateral arms, skin tear noted on the left elbow.  Large bruise across the forehead.  Neurological:     Mental Status: She is alert. She is disoriented and confused.     GCS: GCS eye subscore is 4. GCS verbal subscore is 4. GCS motor subscore is 6.     Cranial Nerves: Cranial nerves are intact.     Sensory: Sensation is intact.     Motor: Motor function is intact.  Psychiatric:        Behavior: Behavior normal.     ED Results /  Procedures / Treatments   Labs (all labs ordered are listed, but only abnormal results are displayed) Labs Reviewed  CBG MONITORING, ED - Abnormal; Notable for the following components:      Result Value   Glucose-Capillary 151 (*)    All other components within normal limits  CULTURE, BLOOD (ROUTINE X 2)  CULTURE, BLOOD (ROUTINE X 2)  COMPREHENSIVE METABOLIC PANEL  CBC WITH DIFFERENTIAL/PLATELET  URINALYSIS, COMPLETE (UACMP) WITH MICROSCOPIC  AMMONIA  PROTIME-INR  APTT  CK  TSH    EKG None  Radiology No results found.  Procedures Procedures   Medications Ordered in ED Medications  thiamine (B-1) injection 100 mg (has no administration in time range)    ED Course  I have reviewed the triage vital signs and the nursing notes.  Pertinent labs & imaging results that were available during my care of the patient were reviewed by me and considered in my medical decision making (see chart for details).    MDM Rules/Calculators/A&P                          11:29 AM BP 138/84   Pulse (!) 105   Temp 97.7 F (36.5 C) (Rectal)   Resp (!) 22   SpO2 99%  Patient here with AMS. The differential diagnosis for AMS is extensive and includes, but is not limited to: drug overdose - opioids, alcohol, sedatives, antipsychotics, drug withdrawal, others; Metabolic: hypoxia, hypoglycemia, hyperglycemia, hypercalcemia, hypernatremia, hyponatremia, uremia, hepatic encephalopathy, hypothyroidism, hyperthyroidism, vitamin B12 or thiamine deficiency, carbon monoxide poisoning, Wilson's disease, Lactic acidosis, DKA/HHOS; Infectious: meningitis, encephalitis, bacteremia/sepsis, urinary tract infection, pneumonia, neurosyphilis; Structural: Space-occupying lesion, (brain tumor, subdural hematoma, hydrocephalus,); Vascular: stroke, subarachnoid hemorrhage, coronary ischemia, hypertensive encephalopathy, CNS vasculitis, thrombotic thrombocytopenic purpura, disseminated intravascular coagulation,  hyperviscosity; Psychiatric: Schizophrenia, depression; Other: Seizure, hypothermia, heat stroke, ICU psychosis, dementia -"sundowning."  I have ordered labs that include CBC, CMP, ammonia, urinalysis, CBG which is resulted at 151, blood cultures, PT/INR, CK, APTT and TSH levels.  I have ordered a 1 view chest x-ray   Final Clinical Impression(s) / ED Diagnoses Final diagnoses:  None    Rx / DC Orders ED Discharge Orders    None

## 2021-01-22 DIAGNOSIS — E11649 Type 2 diabetes mellitus with hypoglycemia without coma: Secondary | ICD-10-CM

## 2021-01-22 DIAGNOSIS — E1165 Type 2 diabetes mellitus with hyperglycemia: Secondary | ICD-10-CM | POA: Diagnosis not present

## 2021-01-22 DIAGNOSIS — E119 Type 2 diabetes mellitus without complications: Secondary | ICD-10-CM | POA: Diagnosis not present

## 2021-01-22 DIAGNOSIS — T796XXA Traumatic ischemia of muscle, initial encounter: Secondary | ICD-10-CM

## 2021-01-22 DIAGNOSIS — I10 Essential (primary) hypertension: Secondary | ICD-10-CM | POA: Diagnosis not present

## 2021-01-22 DIAGNOSIS — E7800 Pure hypercholesterolemia, unspecified: Secondary | ICD-10-CM

## 2021-01-22 DIAGNOSIS — T796XXD Traumatic ischemia of muscle, subsequent encounter: Secondary | ICD-10-CM

## 2021-01-22 DIAGNOSIS — E1129 Type 2 diabetes mellitus with other diabetic kidney complication: Secondary | ICD-10-CM

## 2021-01-22 DIAGNOSIS — E890 Postprocedural hypothyroidism: Secondary | ICD-10-CM | POA: Diagnosis not present

## 2021-01-22 LAB — CBC
HCT: 38 % (ref 36.0–46.0)
Hemoglobin: 13 g/dL (ref 12.0–15.0)
MCH: 32.1 pg (ref 26.0–34.0)
MCHC: 34.2 g/dL (ref 30.0–36.0)
MCV: 93.8 fL (ref 80.0–100.0)
Platelets: 149 10*3/uL — ABNORMAL LOW (ref 150–400)
RBC: 4.05 MIL/uL (ref 3.87–5.11)
RDW: 11.9 % (ref 11.5–15.5)
WBC: 16 10*3/uL — ABNORMAL HIGH (ref 4.0–10.5)
nRBC: 0 % (ref 0.0–0.2)

## 2021-01-22 LAB — TROPONIN I (HIGH SENSITIVITY): Troponin I (High Sensitivity): 2809 ng/L (ref <18)

## 2021-01-22 LAB — BLOOD CULTURE ID PANEL (REFLEXED) - BCID2

## 2021-01-22 LAB — COMPREHENSIVE METABOLIC PANEL
ALT: 114 U/L — ABNORMAL HIGH (ref 0–44)
AST: 284 U/L — ABNORMAL HIGH (ref 15–41)
Albumin: 2.8 g/dL — ABNORMAL LOW (ref 3.5–5.0)
Alkaline Phosphatase: 42 U/L (ref 38–126)
Anion gap: 11 (ref 5–15)
BUN: 21 mg/dL (ref 8–23)
CO2: 14 mmol/L — ABNORMAL LOW (ref 22–32)
Calcium: 8.1 mg/dL — ABNORMAL LOW (ref 8.9–10.3)
Chloride: 109 mmol/L (ref 98–111)
Creatinine, Ser: 0.86 mg/dL (ref 0.44–1.00)
GFR, Estimated: >60 mL/min (ref >60)
Glucose, Bld: 107 mg/dL — ABNORMAL HIGH (ref 70–99)
Potassium: 3.5 mmol/L (ref 3.5–5.1)
Sodium: 134 mmol/L — ABNORMAL LOW (ref 135–145)
Total Bilirubin: 1.9 mg/dL — ABNORMAL HIGH (ref 0.3–1.2)
Total Protein: 5.4 g/dL — ABNORMAL LOW (ref 6.5–8.1)

## 2021-01-22 LAB — GLUCOSE, CAPILLARY
Glucose-Capillary: 104 mg/dL — ABNORMAL HIGH (ref 70–99)
Glucose-Capillary: 92 mg/dL (ref 70–99)
Glucose-Capillary: 86 mg/dL (ref 70–99)
Glucose-Capillary: 74 mg/dL (ref 70–99)

## 2021-01-22 LAB — MAGNESIUM: Magnesium: 2.6 mg/dL — ABNORMAL HIGH (ref 1.7–2.4)

## 2021-01-22 LAB — CK: Total CK: 10834 U/L — ABNORMAL HIGH (ref 38–234)

## 2021-01-22 MED ORDER — DOCUSATE SODIUM 100 MG PO CAPS
100.0000 mg | ORAL_CAPSULE | Freq: Two times a day (BID) | ORAL | Status: DC
  Administered 2021-01-23 – 2021-02-01 (×18): 100 mg via ORAL
  Filled 2021-01-22 (×20): qty 1

## 2021-01-22 MED ORDER — POLYETHYLENE GLYCOL 3350 17 G PO PACK
17.0000 g | PACK | Freq: Every day | ORAL | Status: DC | PRN
  Administered 2021-02-01: 17 g via ORAL
  Filled 2021-01-22: qty 1

## 2021-01-22 MED ORDER — PANTOPRAZOLE SODIUM 40 MG PO TBEC
40.0000 mg | DELAYED_RELEASE_TABLET | Freq: Two times a day (BID) | ORAL | Status: DC
  Administered 2021-01-22 – 2021-02-01 (×21): 40 mg via ORAL
  Filled 2021-01-22 (×22): qty 1

## 2021-01-22 MED ORDER — SODIUM CHLORIDE 0.9 % IV SOLN
2.0000 g | Freq: Three times a day (TID) | INTRAVENOUS | Status: DC
  Administered 2021-01-22 – 2021-01-24 (×5): 2 g via INTRAVENOUS
  Filled 2021-01-22 (×6): qty 2

## 2021-01-22 MED ORDER — VANCOMYCIN HCL 1500 MG/300ML IV SOLN
1500.0000 mg | INTRAVENOUS | Status: DC
  Administered 2021-01-22 – 2021-01-24 (×3): 1500 mg via INTRAVENOUS
  Filled 2021-01-22 (×3): qty 300

## 2021-01-22 MED ORDER — ACETAMINOPHEN 325 MG PO TABS
650.0000 mg | ORAL_TABLET | Freq: Four times a day (QID) | ORAL | Status: DC
  Administered 2021-01-22 – 2021-02-01 (×38): 650 mg via ORAL
  Filled 2021-01-22 (×39): qty 2

## 2021-01-22 MED ORDER — LIDOCAINE 5 % EX PTCH
1.0000 | MEDICATED_PATCH | CUTANEOUS | Status: DC
  Administered 2021-01-22 – 2021-02-01 (×11): 1 via TRANSDERMAL
  Filled 2021-01-22 (×11): qty 1

## 2021-01-22 MED ORDER — METHOCARBAMOL 500 MG PO TABS
500.0000 mg | ORAL_TABLET | Freq: Three times a day (TID) | ORAL | Status: DC
  Administered 2021-01-22 – 2021-02-01 (×32): 500 mg via ORAL
  Filled 2021-01-22 (×34): qty 1

## 2021-01-22 MED ORDER — POTASSIUM CHLORIDE CRYS ER 20 MEQ PO TBCR: 20.0000 meq | EXTENDED_RELEASE_TABLET | Freq: Once | ORAL | Status: DC

## 2021-01-22 NOTE — Progress Notes (Signed)
Pharmacy Antibiotic Note  Kristina Gates is a 65 y.o. female admitted on 01/21/2021 with  urosepsis. Pharmacy has been consulted for vancomycin and cefepime dosing. SCr down to 0.86.  Ucx growing Ecoli (s-pending). BCx with 2/2 bottles from 1 set growing staph species and 1/4 bottles from same set with GPR.   Plan: Adjust vancomycin to 1500mg  IV q24h (estimated AUC 468, goal 400-550, Scr used: 0.86) Adjust cefepime to 2g IV q8h Monitor clinical progress, c/s, renal function F/u de-escalation plan/LOT, vancomycin levels as indicated   Temp (24hrs), Avg:98.2 F (36.8 C), Min:97.6 F (36.4 C), Max:99.3 F (37.4 C)  Recent Labs  Lab 01/21/21 1116 01/21/21 1353 01/21/21 1552 01/21/21 2139 01/22/21 0105  WBC 20.8*  --   --  16.6* 16.0*  CREATININE 1.27*  --   --  0.96 0.86  LATICACIDVEN  --  5.9* 1.3  --   --     Estimated Creatinine Clearance: 69.4 mL/min (by C-G formula based on SCr of 0.86 mg/dL).    Allergies  Allergen Reactions  . Metformin And Related Cough  . Zestril [Lisinopril] Cough    Antimicrobials this admission: Vancomycin 3/3 >>  Cefepime 3/3 >>  Metronidazole 3/3 x 1  Microbiology results: 3/3 UC - Ecoli (s-pending) 3/3 BCx - staph sp 2/2, GPR 1/4 (all from 1/2 sets)   03/24/21, PharmD, BCPS Please check AMION for all [facility] Pharm[facility] contact numbers Clinical Pharmacist 01/22/2021 11:36 AM

## 2021-01-22 NOTE — Progress Notes (Signed)
PHARMACY - PHYSICIAN COMMUNICATION CRITICAL VALUE ALERT - BLOOD CULTURE IDENTIFICATION (BCID)  Kristina Gates is an 65 y.o. female who presented to [facility] on 01/21/2021 with a chief complaint of sepsis.   Assessment:  Bcx with GPCs in clusters in 2/4 bottles and GPRs on 1/4 bottles. BCID detects staph species. Patient also noted to have >100K GNRs in Ucx. Currently afebrile, WBC 16. LAC on 3/3 elevated at 5.9.  Name of physician (or Provider) Contacted: Standley Dakins, MD  Current antibiotics: vancomycin 1g IV q24h, cefepime 2g IV q12h  Changes to prescribed antibiotics recommended:  Patient is on recommended antibiotics - No changes needed  Results for orders placed or performed during the hospital encounter of 01/21/21  Blood Culture ID Panel (Reflexed) (Collected: 01/21/2021 11:16 AM)  Result Value Ref Range   Enterococcus faecalis NOT DETECTED NOT DETECTED   Enterococcus Faecium NOT DETECTED NOT DETECTED   Listeria monocytogenes NOT DETECTED NOT DETECTED   Staphylococcus species DETECTED (A) NOT DETECTED   Staphylococcus aureus (BCID) NOT DETECTED NOT DETECTED   Staphylococcus epidermidis NOT DETECTED NOT DETECTED   Staphylococcus lugdunensis NOT DETECTED NOT DETECTED   Streptococcus species NOT DETECTED NOT DETECTED   Streptococcus agalactiae NOT DETECTED NOT DETECTED   Streptococcus pneumoniae NOT DETECTED NOT DETECTED   Streptococcus pyogenes NOT DETECTED NOT DETECTED   A.calcoaceticus-baumannii NOT DETECTED NOT DETECTED   Bacteroides fragilis NOT DETECTED NOT DETECTED   Enterobacterales NOT DETECTED NOT DETECTED   Enterobacter cloacae complex NOT DETECTED NOT DETECTED   Escherichia coli NOT DETECTED NOT DETECTED   Klebsiella aerogenes NOT DETECTED NOT DETECTED   Klebsiella oxytoca NOT DETECTED NOT DETECTED   Klebsiella pneumoniae NOT DETECTED NOT DETECTED   Proteus species NOT DETECTED NOT DETECTED   Salmonella species NOT DETECTED NOT DETECTED   Serratia marcescens  NOT DETECTED NOT DETECTED   Haemophilus influenzae NOT DETECTED NOT DETECTED   Neisseria meningitidis NOT DETECTED NOT DETECTED   Pseudomonas aeruginosa NOT DETECTED NOT DETECTED   Stenotrophomonas maltophilia NOT DETECTED NOT DETECTED   Candida albicans NOT DETECTED NOT DETECTED   Candida auris NOT DETECTED NOT DETECTED   Candida glabrata NOT DETECTED NOT DETECTED   Candida krusei NOT DETECTED NOT DETECTED   Candida parapsilosis NOT DETECTED NOT DETECTED   Candida tropicalis NOT DETECTED NOT DETECTED   Cryptococcus neoformans/gattii NOT DETECTED NOT DETECTED    Trixie Rude, PharmD PGY1 Acute Care Pharmacy Resident 01/22/2021 8:53 AM  Please check [email or web address] for unit-specific pharmacy phone numbers.

## 2021-01-22 NOTE — Progress Notes (Signed)
PROGRESS NOTE   Kristina Gates  GUY:[id number] DOB: [birth year] DOA: 01/21/2021 PCP: Patient, No Pcp Per   Chief Complaint  Patient presents with  . Altered Mental Status  . Fall   Level of care: Telemetry Medical  Brief Admission History:  65 y.o. female with PMH significant for HTN, DM 2, HL who was in her usual state of excellent health until about 4 to 5 days ago when she felt weak was not sure why she felt weak.  She states that she apparently had difficulty walking around and tried to rest.  She remembers at one point someone was knocking at the door but she was too tired to get it so did not.  She apparently fell but does not remember falling.  She does remember lying on the floor intermittently but has very little memory of the events.  She states that she texts her sister every morning and apparently her sister had not heard from her in 2 days so asked the police for a welfare check.  The police found her down on the floor covered in urine.  Patient is unable to provide a review of systems that she does not really remember much of what has been going on.  She does remember feeling very weak and tired.  He does not think she has had any fevers or chills but she is not sure.  Does not think she has had a cough or shortness of breath but is not sure.  ED Course:  The patient was noted to be acutely confused, normotensive with unlabored tachypnea.  T-max was 99.3.  She had a GCS of 14 when she came in.  Patient underwent extensive work-up which was notable for UTI, lactic acidosis, significant rhabdomyolysis with CPK of 26K, acute kidney injury.  Head CT was negative for any intracranial bleed or abnormality.  X-ray does reveal 4 consecutive rib fractures posteriorly without pneumothorax.  She was treated for sepsis with aggressive fluid resuscitation and initiation of broad-spectrum antibiotics with vancomycin and cefepime and Flagyl.  Her mental status improved steadily with treatment of  infection and with hydration.  However while she was being treated patient had acute episode of SVT with heart rate of 206.  This was aborted promptly with 6 mg of adenosine. Assessment & Plan:   Active Problems:   Essential hypertension   Hyperglycemia due to type 2 diabetes mellitus (HCC)   Postoperative hypothyroidism   Pure hypercholesterolemia   Sepsis (HCC)   Traumatic rhabdomyolysis (HCC)   Diabetes mellitus without complication (HCC)   1. Sepsis secondary to UTI with polymicrobial bacteremia - BC positive for GPC, GPR, urine culture with GNR.  Continue supportive measures and broad spectrum antibiotics.  2. Nontraumatic rhabdomyolysis - CK trending down with aggressive hydration, continue to follow CK level for now.  Try to ambulate with PT.  3. Gram negative rod UTI - awaiting C&S findings. Continue cefepime/vancomycin for now.  4. Bacteremia - continue broad spectrum coverage pending ID and sensitivities.  5. Rib fracture -continue conservative treatment.  6. SVT - Treated with adenosine in ED with resolution.  Resume home atenolol.  Cardiac monitor.  7. Hypokalemia - Oral and IV replacement was given.  8. Elevated LFTs - possibly related to rhabdomyolysis.  Follow. Continue supportive measures.  9. Leukocytosis - Follow CBC with diff.  10. Elevated troponin - suspect elevated in setting of rhabdomyolysis.  Do not suspect ACS.  11. AKI - improved with IV fluid hydration.  12. Hypomagnesemia -  repleted.   13. Gastritis - protonix ordered for GI protection.  14. Left parotid gland lesion - Outpatient follow up recommended.  15. Essential hypertension - resuming home atenolol.  Holding home HCTZ and losartan for now.   DVT prophylaxis: enoxaparin  Code Status: full  Family Communication:  Disposition: TBD  Status is: Inpatient  Remains inpatient appropriate because:IV treatments appropriate due to intensity of illness or inability to take PO and Inpatient level of care  appropriate due to severity of illness  Dispo: The patient is from: Home              Anticipated d/c is to: Home              Patient currently is not medically stable to d/c.   Difficult to place patient No  Consultants:   PT  Procedures:   N/a  Antimicrobials:  Cefepime 3/3>> Vancomycin 3/3>>  Subjective: Pt reports feeling a little better but still no clear memory of the last couple of days or events leading to admission.   Objective: Vitals:   01/22/21 0420 01/22/21 0929 01/22/21 0932 01/22/21 1100  BP: (!) 116/58  132/68   Pulse: 86 90 91   Resp: 18     Temp: 98.1 F (36.7 C)     TempSrc: (P) Oral     SpO2: 100%     Weight:    81.9 kg  Height:     (1.676 m)    Intake/Output Summary (Last 24 hours) at 01/22/2021 1238 Last data filed at 01/22/2021 0925 Gross per 24 hour  Intake 6654.93 ml  Output 650 ml  Net 6004.93 ml   Filed Weights   01/21/21 1345 01/21/21 2317 01/22/21 1100  Weight: 80.3 kg 81.9 kg 81.9 kg    Examination:  General exam: frail, elderly female, Appears calm and comfortable  Respiratory system: Clear to auscultation. Respiratory effort normal. Cardiovascular system: normal S1 & S2 heard. No JVD, murmurs, rubs, gallops or clicks. No pedal edema. Gastrointestinal system: Abdomen is nondistended, soft and nontender. No organomegaly or masses felt. Normal bowel sounds heard. Central nervous system: Alert and oriented. No focal neurological deficits. Extremities: Symmetric 5 x 5 power. Skin: No rashes, lesions or ulcers Psychiatry: Judgement and insight appear poor. Mood & affect flat.   Data Reviewed: I have personally reviewed following labs and imaging studies  CBC: Recent Labs  Lab 01/21/21 1116 01/21/21 1731 01/21/21 2139 01/22/21 0105  WBC 20.8*  --  16.6* 16.0*  NEUTROABS 18.2*  --   --   --   HGB 16.4* 12.2 13.2 13.0  HCT 46.0 36.0 35.8* 38.0  MCV 91.3  --  91.1 93.8  PLT 188  --  152 149*    Basic Metabolic  Panel: Recent Labs  Lab 01/21/21 1116 01/21/21 1708 01/21/21 1731 01/21/21 2139 01/22/21 0105  NA 132*  --  136  --  134*  K 3.8 3.2* 3.1*  --  3.5  CL 98  --   --   --  109  CO2 15*  --   --   --  14*  GLUCOSE 142*  --   --   --  107*  BUN 29*  --   --   --  21  CREATININE 1.27*  --   --  0.96 0.86  CALCIUM 9.6  --   --   --  8.1*  MG  --  1.5*  --   --  2.6*  PHOS  --  3.1  --   --   --     GFR: Estimated Creatinine Clearance: 69.4 mL/min (by C-G formula based on SCr of 0.86 mg/dL).  Liver Function Tests: Recent Labs  Lab 01/21/21 1116 01/22/21 0105  AST 494* 284*  ALT 160* 114*  ALKPHOS 58 42  BILITOT 1.8* 1.9*  PROT 7.2 5.4*  ALBUMIN 3.8 2.8*    CBG: Recent Labs  Lab 01/21/21 1112 01/21/21 2100 01/22/21 0800 01/22/21 1154  GLUCAP 151* 117* 104* 92    Recent Results (from the past 240 hour(s))  Blood Cultures (routine x 2)     Status: None (Preliminary result)   Collection Time: 01/21/21 11:10 AM   Specimen: BLOOD  Result Value Ref Range Status   Specimen Description BLOOD RIGHT ANTECUBITAL  Final   Special Requests   Final    BOTTLES DRAWN AEROBIC AND ANAEROBIC Blood Culture results may not be optimal due to an excessive volume of blood received in culture bottles   Culture   Final    NO GROWTH < 24 HOURS Performed at [facility], [address] [place], [place] [address]    Report Status PENDING  Incomplete  Blood Cultures (routine x 2)     Status: None (Preliminary result)   Collection Time: 01/21/21 11:16 AM   Specimen: BLOOD  Result Value Ref Range Status   Specimen Description BLOOD SITE NOT SPECIFIED  Final   Special Requests   Final    BOTTLES DRAWN AEROBIC AND ANAEROBIC Blood Culture results may not be optimal due to an inadequate volume of blood received in culture bottles   Culture  Setup Time   Final    GRAM POSITIVE COCCI IN CLUSTERS IN BOTH AEROBIC AND ANAEROBIC BOTTLES GRAM POSITIVE RODS ANAEROBIC BOTTLE ONLY Organism ID  to follow CRITICAL RESULT CALLED TO, READ BACK BY AND VERIFIED WITH: PHARM D T.JACKIE AT [id number] ON 01/22/2021 BY T.SAAD Performed at [facility], [address] [place], [place] [address]    Culture GRAM POSITIVE RODS GRAM POSITIVE COCCI   Final   Report Status PENDING  Incomplete  Blood Culture ID Panel (Reflexed)     Status: Abnormal   Collection Time: 01/21/21 11:16 AM  Result Value Ref Range Status   Enterococcus faecalis NOT DETECTED NOT DETECTED Final   Enterococcus Faecium NOT DETECTED NOT DETECTED Final   Listeria monocytogenes NOT DETECTED NOT DETECTED Final   Staphylococcus species DETECTED (A) NOT DETECTED Final    Comment: CRITICAL RESULT CALLED TO, READ BACK BY AND VERIFIED WITH: PHARM D T.JACKIE AT [id number] ON 01/22/2021 BY T.SAAD.    Staphylococcus aureus (BCID) NOT DETECTED NOT DETECTED Final   Staphylococcus epidermidis NOT DETECTED NOT DETECTED Final   Staphylococcus lugdunensis NOT DETECTED NOT DETECTED Final   Streptococcus species NOT DETECTED NOT DETECTED Final   Streptococcus agalactiae NOT DETECTED NOT DETECTED Final   Streptococcus pneumoniae NOT DETECTED NOT DETECTED Final   Streptococcus pyogenes NOT DETECTED NOT DETECTED Final   A.calcoaceticus-baumannii NOT DETECTED NOT DETECTED Final   Bacteroides fragilis NOT DETECTED NOT DETECTED Final   Enterobacterales NOT DETECTED NOT DETECTED Final   Enterobacter cloacae complex NOT DETECTED NOT DETECTED Final   Escherichia coli NOT DETECTED NOT DETECTED Final   Klebsiella aerogenes NOT DETECTED NOT DETECTED Final   Klebsiella oxytoca NOT DETECTED NOT DETECTED Final   Klebsiella pneumoniae NOT DETECTED NOT DETECTED Final   Proteus species NOT DETECTED NOT DETECTED Final   Salmonella species NOT DETECTED NOT  DETECTED Final   Serratia marcescens NOT DETECTED NOT DETECTED Final   Haemophilus influenzae NOT DETECTED NOT DETECTED Final   Neisseria meningitidis NOT DETECTED NOT DETECTED Final   Pseudomonas  aeruginosa NOT DETECTED NOT DETECTED Final   Stenotrophomonas maltophilia NOT DETECTED NOT DETECTED Final   Candida albicans NOT DETECTED NOT DETECTED Final   Candida auris NOT DETECTED NOT DETECTED Final   Candida glabrata NOT DETECTED NOT DETECTED Final   Candida krusei NOT DETECTED NOT DETECTED Final   Candida parapsilosis NOT DETECTED NOT DETECTED Final   Candida tropicalis NOT DETECTED NOT DETECTED Final   Cryptococcus neoformans/gattii NOT DETECTED NOT DETECTED Final    Comment: Performed at [facility], [address] [place], [place] [address]  Urine culture     Status: Abnormal (Preliminary result)   Collection Time: 01/21/21  1:28 PM   Specimen: In/Out Cath Urine  Result Value Ref Range Status   Specimen Description IN/OUT CATH URINE  Final   Special Requests NONE  Final   Culture (A)  Final    >=100,000 COLONIES/mL ESCHERICHIA COLI CULTURE REINCUBATED FOR BETTER GROWTH SUSCEPTIBILITIES TO FOLLOW Performed at [facility], [address], [place], [place] [address]    Report Status PENDING  Incomplete  Resp Panel by RT-PCR (Flu A&B, Covid) Nasopharyngeal Swab     Status: None   Collection Time: 01/21/21  5:01 PM   Specimen: Nasopharyngeal Swab; Nasopharyngeal(NP) swabs in vial transport medium  Result Value Ref Range Status   SARS Coronavirus 2 by RT PCR NEGATIVE NEGATIVE Final    Comment: (NOTE) SARS-CoV-2 target nucleic acids are NOT DETECTED.  The SARS-CoV-2 RNA is generally detectable in upper respiratory specimens during the acute phase of infection. The lowest concentration of SARS-CoV-2 viral copies this assay can detect is 138 copies/mL. A negative result does not preclude SARS-Cov-2 infection and should not be used as the sole basis for treatment or other patient management decisions. A negative result may occur with  improper specimen collection/handling, submission of specimen other than nasopharyngeal swab, presence of viral mutation(s) within  the areas targeted by this assay, and inadequate number of viral copies(<138 copies/mL). A negative result must be combined with clinical observations, patient history, and epidemiological information. The expected result is Negative.  Fact Sheet for Patients:  BloggerCourse.com  Fact Sheet for Healthcare Providers:  SeriousBroker.it  This test is no t yet approved or cleared by the [place] FDA and  has been authorized for detection and/or diagnosis of SARS-CoV-2 by FDA under an Emergency Use Authorization (EUA). This EUA will remain  in effect (meaning this test can be used) for the duration of the COVID-19 declaration under Section 564(b)(1) of the Act, 21 U.S.C.section 360bbb-3(b)(1), unless the authorization is terminated  or revoked sooner.       Influenza A by PCR NEGATIVE NEGATIVE Final   Influenza B by PCR NEGATIVE NEGATIVE Final    Comment: (NOTE) The Xpert Xpress SARS-CoV-2/FLU/RSV plus assay is intended as an aid in the diagnosis of influenza from Nasopharyngeal swab specimens and should not be used as a sole basis for treatment. Nasal washings and aspirates are unacceptable for Xpert Xpress SARS-CoV-2/FLU/RSV testing.  Fact Sheet for Patients: BloggerCourse.com  Fact Sheet for Healthcare Providers: SeriousBroker.it  This test is not yet approved or cleared by the [place] FDA and has been authorized for detection and/or diagnosis of SARS-CoV-2 by FDA under an Emergency Use Authorization (EUA). This EUA will remain in effect (meaning this  test can be used) for the duration of the COVID-19 declaration under Section 564(b)(1) of the Act, 21 U.S.C. section 360bbb-3(b)(1), unless the authorization is terminated or revoked.  Performed at [facility], [address], [place], [place] [address]      Radiology Studies: CT HEAD WO CONTRAST  Result  Date: 01/21/2021 CLINICAL DATA:  Recent fall with headaches and facial pain, initial encounter EXAM: CT HEAD WITHOUT CONTRAST CT CERVICAL SPINE WITHOUT CONTRAST TECHNIQUE: Multidetector CT imaging of the head and cervical spine was performed following the standard protocol without intravenous contrast. Multiplanar CT image reconstructions of the cervical spine were also generated. COMPARISON:  None. FINDINGS: CT HEAD FINDINGS Brain: No evidence of acute infarction, hemorrhage, hydrocephalus, extra-axial collection or mass lesion/mass effect. Vascular: No hyperdense vessel or unexpected calcification. Skull: Normal. Negative for fracture or focal lesion. Sinuses/Orbits: No acute finding. Other: Mild scalp hematoma is noted in the left posterior parietal region related to the recent injury. CT CERVICAL SPINE FINDINGS Alignment: Mild straightening of the normal cervical lordosis is noted likely related to muscular spasm. Skull base and vertebrae: 7 cervical segments are well visualized. Vertebral body height is well maintained. Mild osteophytic changes are noted primarily at C6-7 anteriorly. No acute fracture or acute facet abnormality is noted. The odontoid is within normal limits. Soft tissues and spinal canal: There is a 16 by 25 mm mildly hyperdense nodule identified in the posterior aspect of the left parotid gland. This was present on prior CT of the neck from 08/25/2020 and is somewhat smaller in size when compared with the prior exam. Upper chest: Visualized lung apices are within normal limits. Other: None IMPRESSION: CT of the head: Mild scalp hematoma in the left posterior parietal region. No acute intracranial abnormality noted. CT of the cervical spine: Mild degenerative change of the cervical spine without acute bony abnormality. Left parotid lesion somewhat smaller and less heterogeneous when compared with the prior exam as described. ENT referral is again recommended if this has not been performed.  Electronically Signed   By: Alcide Clever M.D.   On: 01/21/2021 14:43   CT CHEST W CONTRAST  Result Date: 01/21/2021 CLINICAL DATA:  Larey Seat. Left rib fractures on a portable chest earlier today. Abdominal trauma. EXAM: CT CHEST, ABDOMEN, AND PELVIS WITH CONTRAST TECHNIQUE: Multidetector CT imaging of the chest, abdomen and pelvis was performed following the standard protocol during bolus administration of intravenous contrast. CONTRAST:  OMNIPAQUE IOHEXOL 300 MG/ML  SOLN COMPARISON:  Portable chest obtained earlier today. FINDINGS: CT CHEST FINDINGS Cardiovascular: Atheromatous calcifications, including the coronary arteries and aorta. Normal sized heart. Minimal pericardial effusion with a maximum thickness of 7 mm. Mediastinum/Nodes: Very small thyroid gland or thyroid remnant. No enlarged lymph nodes. Unremarkable esophagus. Lungs/Pleura: Minimal bilateral dependent atelectasis. Otherwise, clear lungs. No pneumothorax or pleural fluid. Musculoskeletal: Left posterolateral acute 4th through 8th rib fractures. Varying degrees of displacement. No spinal fractures or subluxations. The sternum is intact. Thoracic and lower cervical spine degenerative changes. Diffuse osteopenia. CT ABDOMEN PELVIS FINDINGS Hepatobiliary: No focal liver abnormality is seen. No gallstones, gallbladder wall thickening, or biliary dilatation. Pancreas: Unremarkable. No pancreatic ductal dilatation or surrounding inflammatory changes. Spleen: Normal in size without focal abnormality. Adrenals/Urinary Tract: Normal appearing adrenal glands. Small bilateral renal cysts. Unremarkable ureters and urinary bladder. Stomach/Bowel: Moderate diffuse low density wall thickening involving the gastric antrum, pylorus, duodenal bulb and 2nd portion of the duodenum. Minimal adjacent soft tissue stranding. Moderate to marked luminal narrowing. Unremarkable colon  and appendix. Vascular/Lymphatic: Atheromatous arterial calcifications without aneurysm.  No enlarged lymph nodes. Reproductive: Status post hysterectomy. No adnexal masses. Other: Small umbilical hernia containing fat. Multiple small supraumbilical and infraumbilical ventral hernias containing fat. Musculoskeletal: Lumbar spine degenerative changes. These include facet degenerative changes with associated grade 1 anterolisthesis at the L4-5 level. No fractures, subluxations or dislocations. IMPRESSION: 1. Left posterolateral acute 4th through 8th rib fractures without pneumothorax. 2. Moderate diffuse low density wall thickening involving the gastric antrum, pylorus, duodenal bulb and 2nd portion of the duodenum with associated moderate to marked luminal narrowing. This is most likely due to gastritis and duodenitis. 3. Minimal pericardial effusion. 4. Multiple small supraumbilical and infraumbilical ventral hernias containing fat. 5. Calcific coronary artery and aortic atherosclerosis. Aortic Atherosclerosis (ICD10-I70.0). Electronically Signed   By: Beckie Salts M.D.   On: 01/21/2021 16:43   CT CERVICAL SPINE WO CONTRAST  Result Date: 01/21/2021 CLINICAL DATA:  Recent fall with headaches and facial pain, initial encounter EXAM: CT HEAD WITHOUT CONTRAST CT CERVICAL SPINE WITHOUT CONTRAST TECHNIQUE: Multidetector CT imaging of the head and cervical spine was performed following the standard protocol without intravenous contrast. Multiplanar CT image reconstructions of the cervical spine were also generated. COMPARISON:  None. FINDINGS: CT HEAD FINDINGS Brain: No evidence of acute infarction, hemorrhage, hydrocephalus, extra-axial collection or mass lesion/mass effect. Vascular: No hyperdense vessel or unexpected calcification. Skull: Normal. Negative for fracture or focal lesion. Sinuses/Orbits: No acute finding. Other: Mild scalp hematoma is noted in the left posterior parietal region related to the recent injury. CT CERVICAL SPINE FINDINGS Alignment: Mild straightening of the normal cervical  lordosis is noted likely related to muscular spasm. Skull base and vertebrae: 7 cervical segments are well visualized. Vertebral body height is well maintained. Mild osteophytic changes are noted primarily at C6-7 anteriorly. No acute fracture or acute facet abnormality is noted. The odontoid is within normal limits. Soft tissues and spinal canal: There is a 16 by 25 mm mildly hyperdense nodule identified in the posterior aspect of the left parotid gland. This was present on prior CT of the neck from 08/25/2020 and is somewhat smaller in size when compared with the prior exam. Upper chest: Visualized lung apices are within normal limits. Other: None IMPRESSION: CT of the head: Mild scalp hematoma in the left posterior parietal region. No acute intracranial abnormality noted. CT of the cervical spine: Mild degenerative change of the cervical spine without acute bony abnormality. Left parotid lesion somewhat smaller and less heterogeneous when compared with the prior exam as described. ENT referral is again recommended if this has not been performed. Electronically Signed   By: Alcide Clever M.D.   On: 01/21/2021 14:43   CT ABDOMEN PELVIS W CONTRAST  Result Date: 01/21/2021 CLINICAL DATA:  Larey Seat. Left rib fractures on a portable chest earlier today. Abdominal trauma. EXAM: CT CHEST, ABDOMEN, AND PELVIS WITH CONTRAST TECHNIQUE: Multidetector CT imaging of the chest, abdomen and pelvis was performed following the standard protocol during bolus administration of intravenous contrast. CONTRAST:  OMNIPAQUE IOHEXOL 300 MG/ML  SOLN COMPARISON:  Portable chest obtained earlier today. FINDINGS: CT CHEST FINDINGS Cardiovascular: Atheromatous calcifications, including the coronary arteries and aorta. Normal sized heart. Minimal pericardial effusion with a maximum thickness of 7 mm. Mediastinum/Nodes: Very small thyroid gland or thyroid remnant. No enlarged lymph nodes. Unremarkable esophagus. Lungs/Pleura: Minimal  bilateral dependent atelectasis. Otherwise, clear lungs. No pneumothorax or pleural fluid. Musculoskeletal: Left posterolateral acute 4th through 8th rib fractures. Varying degrees of displacement. No  spinal fractures or subluxations. The sternum is intact. Thoracic and lower cervical spine degenerative changes. Diffuse osteopenia. CT ABDOMEN PELVIS FINDINGS Hepatobiliary: No focal liver abnormality is seen. No gallstones, gallbladder wall thickening, or biliary dilatation. Pancreas: Unremarkable. No pancreatic ductal dilatation or surrounding inflammatory changes. Spleen: Normal in size without focal abnormality. Adrenals/Urinary Tract: Normal appearing adrenal glands. Small bilateral renal cysts. Unremarkable ureters and urinary bladder. Stomach/Bowel: Moderate diffuse low density wall thickening involving the gastric antrum, pylorus, duodenal bulb and 2nd portion of the duodenum. Minimal adjacent soft tissue stranding. Moderate to marked luminal narrowing. Unremarkable colon and appendix. Vascular/Lymphatic: Atheromatous arterial calcifications without aneurysm. No enlarged lymph nodes. Reproductive: Status post hysterectomy. No adnexal masses. Other: Small umbilical hernia containing fat. Multiple small supraumbilical and infraumbilical ventral hernias containing fat. Musculoskeletal: Lumbar spine degenerative changes. These include facet degenerative changes with associated grade 1 anterolisthesis at the L4-5 level. No fractures, subluxations or dislocations. IMPRESSION: 1. Left posterolateral acute 4th through 8th rib fractures without pneumothorax. 2. Moderate diffuse low density wall thickening involving the gastric antrum, pylorus, duodenal bulb and 2nd portion of the duodenum with associated moderate to marked luminal narrowing. This is most likely due to gastritis and duodenitis. 3. Minimal pericardial effusion. 4. Multiple small supraumbilical and infraumbilical ventral hernias containing fat. 5.  Calcific coronary artery and aortic atherosclerosis. Aortic Atherosclerosis (ICD10-I70.0). Electronically Signed   By: Beckie Salts M.D.   On: 01/21/2021 16:43   DG Chest Port 1 View  Result Date: 01/21/2021 CLINICAL DATA:  Altered mental status following a fall today. EXAM: PORTABLE CHEST 1 VIEW COMPARISON:  None. FINDINGS: Normal sized heart. Mildly tortuous and minimally calcified thoracic aorta. Minimal linear atelectasis or scarring in the left lower lung zone. Otherwise clear lungs with normal vascularity. Mildly displaced left 4th posterior rib fracture. Minimally displaced left 6th and 7th posterior rib fractures. No pneumothorax. IMPRESSION: Left 4th, 6th and 7th posterior rib fractures without pneumothorax. Electronically Signed   By: Beckie Salts M.D.   On: 01/21/2021 11:45    Scheduled Meds: . acetaminophen  650 mg Oral Q6H  . atenolol  50 mg Oral Daily  . docusate sodium  100 mg Oral BID  . insulin aspart  0-9 Units Subcutaneous TID WC  . levothyroxine  100 mcg Oral Daily  . lidocaine  1 patch Transdermal Q24H  . methocarbamol  500 mg Oral TID   Continuous Infusions: . 0.9 % NaCl with KCl 20 mEq / L 150 mL/hr at 01/22/21 0931  . ceFEPime (MAXIPIME) IV    . vancomycin       LOS: 1 day   Time spent: 37 mins   Laylah Riga Laural Benes, MD How to contact the [facility] Attending or Consulting provider 7A - 7P or covering provider during after hours 7P -7A, for this patient?  1. Check the care team in [facility] and look for a) attending/consulting [facility] provider listed and b) the [facility] team listed 2. Log into [email or web address] and use [facility]'s universal password to access. If you do not have the password, please contact the hospital operator. 3. Locate the [facility] provider you are looking for under [facility] and page to a number that you can be directly reached. 4. If you still have difficulty reaching the provider, please page the [facility] (Director on Call) for the Hospitalists listed on amion for  assistance.  01/22/2021, 12:38 PM

## 2021-01-22 NOTE — Progress Notes (Signed)
Progress Note     Subjective: Patient reports pain in left ribs particularly with coughing but overall reports pain control is improving. Pulled IS to 500 overnight. Denies SOB but is on 2 L via [place]. She feels generalized weakness but is improving some and can touch her hand to her face where she was not able to do this yesterday. She denies abdominal pain or nausea. She does report some gas cramps. She reports she is urinating what feels like too often.   Objective: Vital signs in last 24 hours: Temp:  [97.6 F (36.4 C)-99.3 F (37.4 C)] 98.1 F (36.7 C) (03/04 0420) Pulse Rate:  [79-191] 91 (03/04 0932) Resp:  [11-33] 18 (03/04 0420) BP: (107-163)/(49-115) 132/68 (03/04 0932) SpO2:  [97 %-100 %] 100 % (03/04 0420) Weight:  [80.3 kg-81.9 kg] 81.9 kg (03/03 2317) Last BM Date:  (pt. unable to state when LBM is)  Intake/Output from previous day: 03/03 0701 - 03/04 0700 In: 5754.9 [P.O.:240; I.V.:1414.9; IV Piggyback:4100] Out: 300 [Urine:300] Intake/Output this shift: Total I/O In: 900 [P.O.:900] Out: 350 [Urine:350]  PE: General: pleasant, WD, overweight female who is laying in bed in NAD but appears fatigued HEENT: head is normocephalic, atraumatic.  Sclera are noninjected.  PERRL.  Ears and nose without any masses or lesions.  Mouth is pink and moist Heart: regular, rate, and rhythm.  Normal s1,s2. No obvious murmurs, gallops, or rubs noted.  Palpable radial and pedal pulses bilaterally Lungs: CTAB, no wheezes, rhonchi, or rales noted.  Respiratory effort nonlabored, O2 sat 100% on 2L Abd: soft, NT, ND, +BS, no masses, hernias, or organomegaly MS: non-blanching mild erythema to bilateral knees consistent with stage 1 pressure injuries; some skin maceration of bilateral elbows with darkened tissue consistent with stage 2 pressure injuries Skin: warm and dry with no masses, lesions, or rashes Neuro: Cranial nerves 2-12 grossly intact, sensation is normal throughout Psych:  A&Ox3 with an appropriate affect.    Lab Results:  Recent Labs    01/21/21 2139 01/22/21 0105  WBC 16.6* 16.0*  HGB 13.2 13.0  HCT 35.8* 38.0  PLT 152 149*   BMET Recent Labs    01/21/21 1116 01/21/21 1708 01/21/21 1731 01/21/21 2139 01/22/21 0105  NA 132*  --  136  --  134*  K 3.8   < > 3.1*  --  3.5  CL 98  --   --   --  109  CO2 15*  --   --   --  14*  GLUCOSE 142*  --   --   --  107*  BUN 29*  --   --   --  21  CREATININE 1.27*  --   --  0.96 0.86  CALCIUM 9.6  --   --   --  8.1*   < > = values in this interval not displayed.   PT/INR Recent Labs    01/21/21 1206  LABPROT 14.0  INR 1.1   CMP     Component Value Date/Time   NA 134 (L) 01/22/2021 0105   K 3.5 01/22/2021 0105   CL 109 01/22/2021 0105   CO2 14 (L) 01/22/2021 0105   GLUCOSE 107 (H) 01/22/2021 0105   BUN 21 01/22/2021 0105   CREATININE 0.86 01/22/2021 0105   CALCIUM 8.1 (L) 01/22/2021 0105   PROT 5.4 (L) 01/22/2021 0105   ALBUMIN 2.8 (L) 01/22/2021 0105   AST 284 (H) 01/22/2021 0105   ALT 114 (H) 01/22/2021 0105  ALKPHOS 42 01/22/2021 0105   BILITOT 1.9 (H) 01/22/2021 0105   GFRNONAA >60 01/22/2021 0105   Lipase  No results found for: LIPASE     Studies/Results: CT HEAD WO CONTRAST  Result Date: 01/21/2021 CLINICAL DATA:  Recent fall with headaches and facial pain, initial encounter EXAM: CT HEAD WITHOUT CONTRAST CT CERVICAL SPINE WITHOUT CONTRAST TECHNIQUE: Multidetector CT imaging of the head and cervical spine was performed following the standard protocol without intravenous contrast. Multiplanar CT image reconstructions of the cervical spine were also generated. COMPARISON:  None. FINDINGS: CT HEAD FINDINGS Brain: No evidence of acute infarction, hemorrhage, hydrocephalus, extra-axial collection or mass lesion/mass effect. Vascular: No hyperdense vessel or unexpected calcification. Skull: Normal. Negative for fracture or focal lesion. Sinuses/Orbits: No acute finding. Other: Mild  scalp hematoma is noted in the left posterior parietal region related to the recent injury. CT CERVICAL SPINE FINDINGS Alignment: Mild straightening of the normal cervical lordosis is noted likely related to muscular spasm. Skull base and vertebrae: 7 cervical segments are well visualized. Vertebral body height is well maintained. Mild osteophytic changes are noted primarily at C6-7 anteriorly. No acute fracture or acute facet abnormality is noted. The odontoid is within normal limits. Soft tissues and spinal canal: There is a 16 by 25 mm mildly hyperdense nodule identified in the posterior aspect of the left parotid gland. This was present on prior CT of the neck from 08/25/2020 and is somewhat smaller in size when compared with the prior exam. Upper chest: Visualized lung apices are within normal limits. Other: None IMPRESSION: CT of the head: Mild scalp hematoma in the left posterior parietal region. No acute intracranial abnormality noted. CT of the cervical spine: Mild degenerative change of the cervical spine without acute bony abnormality. Left parotid lesion somewhat smaller and less heterogeneous when compared with the prior exam as described. ENT referral is again recommended if this has not been performed. Electronically Signed   By: Alcide Clever M.D.   On: 01/21/2021 14:43   CT CHEST W CONTRAST  Result Date: 01/21/2021 CLINICAL DATA:  Larey Seat. Left rib fractures on a portable chest earlier today. Abdominal trauma. EXAM: CT CHEST, ABDOMEN, AND PELVIS WITH CONTRAST TECHNIQUE: Multidetector CT imaging of the chest, abdomen and pelvis was performed following the standard protocol during bolus administration of intravenous contrast. CONTRAST:  OMNIPAQUE IOHEXOL 300 MG/ML  SOLN COMPARISON:  Portable chest obtained earlier today. FINDINGS: CT CHEST FINDINGS Cardiovascular: Atheromatous calcifications, including the coronary arteries and aorta. Normal sized heart. Minimal pericardial effusion with a  maximum thickness of 7 mm. Mediastinum/Nodes: Very small thyroid gland or thyroid remnant. No enlarged lymph nodes. Unremarkable esophagus. Lungs/Pleura: Minimal bilateral dependent atelectasis. Otherwise, clear lungs. No pneumothorax or pleural fluid. Musculoskeletal: Left posterolateral acute 4th through 8th rib fractures. Varying degrees of displacement. No spinal fractures or subluxations. The sternum is intact. Thoracic and lower cervical spine degenerative changes. Diffuse osteopenia. CT ABDOMEN PELVIS FINDINGS Hepatobiliary: No focal liver abnormality is seen. No gallstones, gallbladder wall thickening, or biliary dilatation. Pancreas: Unremarkable. No pancreatic ductal dilatation or surrounding inflammatory changes. Spleen: Normal in size without focal abnormality. Adrenals/Urinary Tract: Normal appearing adrenal glands. Small bilateral renal cysts. Unremarkable ureters and urinary bladder. Stomach/Bowel: Moderate diffuse low density wall thickening involving the gastric antrum, pylorus, duodenal bulb and 2nd portion of the duodenum. Minimal adjacent soft tissue stranding. Moderate to marked luminal narrowing. Unremarkable colon and appendix. Vascular/Lymphatic: Atheromatous arterial calcifications without aneurysm. No enlarged lymph nodes. Reproductive: Status post hysterectomy. No adnexal  masses. Other: Small umbilical hernia containing fat. Multiple small supraumbilical and infraumbilical ventral hernias containing fat. Musculoskeletal: Lumbar spine degenerative changes. These include facet degenerative changes with associated grade 1 anterolisthesis at the L4-5 level. No fractures, subluxations or dislocations. IMPRESSION: 1. Left posterolateral acute 4th through 8th rib fractures without pneumothorax. 2. Moderate diffuse low density wall thickening involving the gastric antrum, pylorus, duodenal bulb and 2nd portion of the duodenum with associated moderate to marked luminal narrowing. This is most  likely due to gastritis and duodenitis. 3. Minimal pericardial effusion. 4. Multiple small supraumbilical and infraumbilical ventral hernias containing fat. 5. Calcific coronary artery and aortic atherosclerosis. Aortic Atherosclerosis (ICD10-I70.0). Electronically Signed   By: Beckie Salts M.D.   On: 01/21/2021 16:43   CT CERVICAL SPINE WO CONTRAST  Result Date: 01/21/2021 CLINICAL DATA:  Recent fall with headaches and facial pain, initial encounter EXAM: CT HEAD WITHOUT CONTRAST CT CERVICAL SPINE WITHOUT CONTRAST TECHNIQUE: Multidetector CT imaging of the head and cervical spine was performed following the standard protocol without intravenous contrast. Multiplanar CT image reconstructions of the cervical spine were also generated. COMPARISON:  None. FINDINGS: CT HEAD FINDINGS Brain: No evidence of acute infarction, hemorrhage, hydrocephalus, extra-axial collection or mass lesion/mass effect. Vascular: No hyperdense vessel or unexpected calcification. Skull: Normal. Negative for fracture or focal lesion. Sinuses/Orbits: No acute finding. Other: Mild scalp hematoma is noted in the left posterior parietal region related to the recent injury. CT CERVICAL SPINE FINDINGS Alignment: Mild straightening of the normal cervical lordosis is noted likely related to muscular spasm. Skull base and vertebrae: 7 cervical segments are well visualized. Vertebral body height is well maintained. Mild osteophytic changes are noted primarily at C6-7 anteriorly. No acute fracture or acute facet abnormality is noted. The odontoid is within normal limits. Soft tissues and spinal canal: There is a 16 by 25 mm mildly hyperdense nodule identified in the posterior aspect of the left parotid gland. This was present on prior CT of the neck from 08/25/2020 and is somewhat smaller in size when compared with the prior exam. Upper chest: Visualized lung apices are within normal limits. Other: None IMPRESSION: CT of the head: Mild scalp hematoma  in the left posterior parietal region. No acute intracranial abnormality noted. CT of the cervical spine: Mild degenerative change of the cervical spine without acute bony abnormality. Left parotid lesion somewhat smaller and less heterogeneous when compared with the prior exam as described. ENT referral is again recommended if this has not been performed. Electronically Signed   By: Alcide Clever M.D.   On: 01/21/2021 14:43   CT ABDOMEN PELVIS W CONTRAST  Result Date: 01/21/2021 CLINICAL DATA:  Larey Seat. Left rib fractures on a portable chest earlier today. Abdominal trauma. EXAM: CT CHEST, ABDOMEN, AND PELVIS WITH CONTRAST TECHNIQUE: Multidetector CT imaging of the chest, abdomen and pelvis was performed following the standard protocol during bolus administration of intravenous contrast. CONTRAST:  OMNIPAQUE IOHEXOL 300 MG/ML  SOLN COMPARISON:  Portable chest obtained earlier today. FINDINGS: CT CHEST FINDINGS Cardiovascular: Atheromatous calcifications, including the coronary arteries and aorta. Normal sized heart. Minimal pericardial effusion with a maximum thickness of 7 mm. Mediastinum/Nodes: Very small thyroid gland or thyroid remnant. No enlarged lymph nodes. Unremarkable esophagus. Lungs/Pleura: Minimal bilateral dependent atelectasis. Otherwise, clear lungs. No pneumothorax or pleural fluid. Musculoskeletal: Left posterolateral acute 4th through 8th rib fractures. Varying degrees of displacement. No spinal fractures or subluxations. The sternum is intact. Thoracic and lower cervical spine degenerative changes. Diffuse osteopenia. CT ABDOMEN  PELVIS FINDINGS Hepatobiliary: No focal liver abnormality is seen. No gallstones, gallbladder wall thickening, or biliary dilatation. Pancreas: Unremarkable. No pancreatic ductal dilatation or surrounding inflammatory changes. Spleen: Normal in size without focal abnormality. Adrenals/Urinary Tract: Normal appearing adrenal glands. Small bilateral renal cysts.  Unremarkable ureters and urinary bladder. Stomach/Bowel: Moderate diffuse low density wall thickening involving the gastric antrum, pylorus, duodenal bulb and 2nd portion of the duodenum. Minimal adjacent soft tissue stranding. Moderate to marked luminal narrowing. Unremarkable colon and appendix. Vascular/Lymphatic: Atheromatous arterial calcifications without aneurysm. No enlarged lymph nodes. Reproductive: Status post hysterectomy. No adnexal masses. Other: Small umbilical hernia containing fat. Multiple small supraumbilical and infraumbilical ventral hernias containing fat. Musculoskeletal: Lumbar spine degenerative changes. These include facet degenerative changes with associated grade 1 anterolisthesis at the L4-5 level. No fractures, subluxations or dislocations. IMPRESSION: 1. Left posterolateral acute 4th through 8th rib fractures without pneumothorax. 2. Moderate diffuse low density wall thickening involving the gastric antrum, pylorus, duodenal bulb and 2nd portion of the duodenum with associated moderate to marked luminal narrowing. This is most likely due to gastritis and duodenitis. 3. Minimal pericardial effusion. 4. Multiple small supraumbilical and infraumbilical ventral hernias containing fat. 5. Calcific coronary artery and aortic atherosclerosis. Aortic Atherosclerosis (ICD10-I70.0). Electronically Signed   By: Beckie Salts M.D.   On: 01/21/2021 16:43   DG Chest Port 1 View  Result Date: 01/21/2021 CLINICAL DATA:  Altered mental status following a fall today. EXAM: PORTABLE CHEST 1 VIEW COMPARISON:  None. FINDINGS: Normal sized heart. Mildly tortuous and minimally calcified thoracic aorta. Minimal linear atelectasis or scarring in the left lower lung zone. Otherwise clear lungs with normal vascularity. Mildly displaced left 4th posterior rib fracture. Minimally displaced left 6th and 7th posterior rib fractures. No pneumothorax. IMPRESSION: Left 4th, 6th and 7th posterior rib fractures without  pneumothorax. Electronically Signed   By: Beckie Salts M.D.   On: 01/21/2021 11:45    Anti-infectives: Anti-infectives (From admission, onward)   Start     Dose/Rate Route Frequency Ordered Stop   01/22/21 1400  vancomycin (VANCOREADY) IVPB 1000 mg/200 mL        1,000 mg 200 mL/hr over 60 Minutes Intravenous Every 24 hours 01/21/21 1306     01/21/21 2200  ceFEPIme (MAXIPIME) 2 g in sodium chloride 0.9 % 100 mL IVPB        2 g 200 mL/hr over 30 Minutes Intravenous Every 12 hours 01/21/21 1306     01/21/21 1300  vancomycin (VANCOREADY) IVPB 1750 mg/350 mL        1,750 mg 175 mL/hr over 120 Minutes Intravenous  Once 01/21/21 1249 01/21/21 1705   01/21/21 1245  ceFEPIme (MAXIPIME) 2 g in sodium chloride 0.9 % 100 mL IVPB        2 g 200 mL/hr over 30 Minutes Intravenous  Once 01/21/21 1236 01/21/21 1328   01/21/21 1245  metroNIDAZOLE (FLAGYL) IVPB 500 mg        500 mg 100 mL/hr over 60 Minutes Intravenous  Once 01/21/21 1236 01/21/21 1405   01/21/21 1245  vancomycin (VANCOREADY) IVPB 1000 mg/200 mL  Status:  Discontinued        1,000 mg 200 mL/hr over 60 Minutes Intravenous  Once 01/21/21 1236 01/21/21 1249       Assessment/Plan Hypothyroidism HLD Urosepsis - per primary team, on cefepime and vanc, cx with G- rods AKI/Rhabdomyolysis - per primary team, Cr improving, CK 10,834 this AM  L 4-8 rib fractures - multimodal pain control, IS, pulm  toilet Probable pressure injuries to bilateral knees and elbows - WOC consulted   FEN: CM diet, IVF @ 150cc/h VTE: none currently, ok to have chemical prophylaxis from a trauma standpoint  ID: vanc/cefepime 3/3>>  No other recommendations from a trauma standpoint at this time. Encourage use of IS and multimodal pain control. Added lidocaine patch and scheduled robaxin today. Will order heating pad as well. Ok to work with PT/OT. Recommend close follow up with PCP once discharged. Trauma will sign off at this time, please call as needed with  further questions or concerns.   LOS: 1 day    Juliet RudeKelly R Johnson , [facility] 01/22/2021, 9:48 AM Please see Amion for pager number during day hours 7:00am-4:30pm

## 2021-01-22 NOTE — TOC CAGE-AID Note (Signed)
Transition of Care [facility]) - CAGE-AID Screening   Patient Details  Name: Kristina Gates MRN: [id number] Date of Birth: 1955-11-24  Transition of Care [facility]) CM/SW Contact:    Jimmy Picket, [place] Phone Number: 01/22/2021, 4:35 PM   Clinical Narrative:  Pt reports social alcohol use. Pt denies substance use. Pt did not need resources at this time.   CAGE-AID Screening:    Have You Ever Felt You Ought to Cut Down on Your Drinking or Drug Use?: No Have People Annoyed You By Critizing Your Drinking Or Drug Use?: No Have You Felt Bad Or Guilty About Your Drinking Or Drug Use?: No Have You Ever Had a Drink or Used Drugs First Thing In The Morning to Steady Your Nerves or to Get Rid of a Hangover?: No CAGE-AID Score: 0  Substance Abuse Education Offered: Yes     Jimmy Picket, Theresia Majors, LCASA Clinical Social Worker [phone]

## 2021-01-23 DIAGNOSIS — E119 Type 2 diabetes mellitus without complications: Secondary | ICD-10-CM | POA: Diagnosis not present

## 2021-01-23 DIAGNOSIS — I10 Essential (primary) hypertension: Secondary | ICD-10-CM | POA: Diagnosis not present

## 2021-01-23 DIAGNOSIS — E890 Postprocedural hypothyroidism: Secondary | ICD-10-CM | POA: Diagnosis not present

## 2021-01-23 DIAGNOSIS — E1165 Type 2 diabetes mellitus with hyperglycemia: Secondary | ICD-10-CM | POA: Diagnosis not present

## 2021-01-23 LAB — CBC WITH DIFFERENTIAL/PLATELET
Abs Immature Granulocytes: 0.09 10*3/uL — ABNORMAL HIGH (ref 0.00–0.07)
Basophils Absolute: 0.1 10*3/uL (ref 0.0–0.1)
Basophils Relative: 1 %
Eosinophils Absolute: 0 10*3/uL (ref 0.0–0.5)
Eosinophils Relative: 0 %
HCT: 32.9 % — ABNORMAL LOW (ref 36.0–46.0)
Hemoglobin: 11.5 g/dL — ABNORMAL LOW (ref 12.0–15.0)
Immature Granulocytes: 1 %
Lymphocytes Relative: 13 %
Lymphs Abs: 1.4 10*3/uL (ref 0.7–4.0)
MCH: 33.3 pg (ref 26.0–34.0)
MCHC: 35 g/dL (ref 30.0–36.0)
MCV: 95.4 fL (ref 80.0–100.0)
Monocytes Absolute: 0.8 10*3/uL (ref 0.1–1.0)
Monocytes Relative: 7 %
Neutro Abs: 8.4 10*3/uL — ABNORMAL HIGH (ref 1.7–7.7)
Neutrophils Relative %: 78 %
Platelets: 126 10*3/uL — ABNORMAL LOW (ref 150–400)
RBC: 3.45 MIL/uL — ABNORMAL LOW (ref 3.87–5.11)
RDW: 12.2 % (ref 11.5–15.5)
WBC: 10.8 10*3/uL — ABNORMAL HIGH (ref 4.0–10.5)
nRBC: 0 % (ref 0.0–0.2)

## 2021-01-23 LAB — HEPATITIS PANEL, ACUTE
HCV Ab: NONREACTIVE
Hep A IgM: NONREACTIVE
Hep B C IgM: NONREACTIVE
Hepatitis B Surface Ag: NONREACTIVE

## 2021-01-23 LAB — GLUCOSE, CAPILLARY
Glucose-Capillary: 87 mg/dL (ref 70–99)
Glucose-Capillary: 100 mg/dL — ABNORMAL HIGH (ref 70–99)
Glucose-Capillary: 91 mg/dL (ref 70–99)
Glucose-Capillary: 124 mg/dL — ABNORMAL HIGH (ref 70–99)

## 2021-01-23 LAB — COMPREHENSIVE METABOLIC PANEL
ALT: 109 U/L — ABNORMAL HIGH (ref 0–44)
AST: 223 U/L — ABNORMAL HIGH (ref 15–41)
Albumin: 2.6 g/dL — ABNORMAL LOW (ref 3.5–5.0)
Alkaline Phosphatase: 40 U/L (ref 38–126)
Anion gap: 8 (ref 5–15)
BUN: 12 mg/dL (ref 8–23)
CO2: 14 mmol/L — ABNORMAL LOW (ref 22–32)
Calcium: 7.9 mg/dL — ABNORMAL LOW (ref 8.9–10.3)
Chloride: 115 mmol/L — ABNORMAL HIGH (ref 98–111)
Creatinine, Ser: 0.66 mg/dL (ref 0.44–1.00)
GFR, Estimated: >60 mL/min (ref >60)
Glucose, Bld: 89 mg/dL (ref 70–99)
Potassium: 3.9 mmol/L (ref 3.5–5.1)
Sodium: 137 mmol/L (ref 135–145)
Total Bilirubin: 1.5 mg/dL — ABNORMAL HIGH (ref 0.3–1.2)
Total Protein: 4.9 g/dL — ABNORMAL LOW (ref 6.5–8.1)

## 2021-01-23 LAB — MAGNESIUM: Magnesium: 1.9 mg/dL (ref 1.7–2.4)

## 2021-01-23 LAB — CULTURE, BLOOD (ROUTINE X 2)

## 2021-01-23 LAB — CK: Total CK: 6745 U/L — ABNORMAL HIGH (ref 38–234)

## 2021-01-23 MED ORDER — SODIUM BICARBONATE 8.4 % IV SOLN
INTRAVENOUS | Status: DC
  Filled 2021-01-23 (×10): qty 850

## 2021-01-23 MED ORDER — ENOXAPARIN SODIUM 40 MG/0.4ML ~~LOC~~ SOLN
40.0000 mg | SUBCUTANEOUS | Status: DC
  Administered 2021-01-23 – 2021-02-01 (×9): 40 mg via SUBCUTANEOUS
  Filled 2021-01-23 (×10): qty 0.4

## 2021-01-23 NOTE — Progress Notes (Signed)
PROGRESS NOTE  Kristina Gates  WUJ:[id number] DOB: 1956/07/16 DOA: 01/21/2021 PCP: Patient, No Pcp Per   Chief Complaint  Patient presents with  . Altered Mental Status  . Fall   Level of care: Telemetry Medical  Brief Admission History:  65 y.o. female with PMH significant for HTN, DM 2, HL who was in her usual state of excellent health until about 4 to 5 days ago when she felt weak was not sure why she felt weak.  She states that she apparently had difficulty walking around and tried to rest.  She remembers at one point someone was knocking at the door but she was too tired to get it so did not.  She apparently fell but does not remember falling.  She does remember lying on the floor intermittently but has very little memory of the events.  She states that she texts her sister every morning and apparently her sister had not heard from her in 2 days so asked the police for a welfare check.  The police found her down on the floor covered in urine.  Patient is unable to provide a review of systems that she does not really remember much of what has been going on.  She does remember feeling very weak and tired.  He does not think she has had any fevers or chills but she is not sure.  Does not think she has had a cough or shortness of breath but is not sure.  ED Course:  The patient was noted to be acutely confused, normotensive with unlabored tachypnea.  T-max was 99.3.  She had a GCS of 14 when she came in.  Patient underwent extensive work-up which was notable for UTI, lactic acidosis, significant rhabdomyolysis with CPK of 26K, acute kidney injury.  Head CT was negative for any intracranial bleed or abnormality.  X-ray does reveal 4 consecutive rib fractures posteriorly without pneumothorax.  She was treated for sepsis with aggressive fluid resuscitation and initiation of broad-spectrum antibiotics with vancomycin and cefepime and Flagyl.  Her mental status improved steadily with treatment of  infection and with hydration.  However while she was being treated patient had acute episode of SVT with heart rate of 206.  This was aborted promptly with 6 mg of adenosine.  Assessment & Plan:   Active Problems:   Essential hypertension   Hyperglycemia due to type 2 diabetes mellitus (HCC)   Postoperative hypothyroidism   Pure hypercholesterolemia   Sepsis (HCC)   Traumatic rhabdomyolysis (HCC)   Diabetes mellitus without complication (HCC)  1. Staphylococcal Sepsis - BC positive for staphylococcus species, awaiting ID and sensitivities.  Continue supportive measures and broad spectrum antibiotics.  2. Nontraumatic rhabdomyolysis - CK trending down with aggressive hydration, continue to follow CK level for now.  Try to ambulate with PT.  3. Metabolic acidosis - changed IV fluid to sodium bicarbonate solution.   4. E coli UTI - awaiting C&S findings. Continue cefepime/vancomycin for now.  5. Staphylococcus Bacteremia - continue broad spectrum coverage pending ID and sensitivities. Repeating blood cultures 01/23/21.  6. Rib fracture -continue conservative treatment.  PT eval pending.  7. SVT - Treated with adenosine in ED with resolution.  Resume home atenolol.  Cardiac monitor.  8. Hypokalemia - Repleted after oral and IV replacement was given.  9. Elevated LFTs - possibly related to rhabdomyolysis.  Follow. Continue supportive measures.  Acute hepatitis panel pending.  10. Leukocytosis - Follow CBC with diff.  11. Elevated troponin - suspect  elevated in setting of rhabdomyolysis.  Do not suspect ACS.  12. AKI - improved with IV fluid hydration.  13. Hypomagnesemia - repleted.   14. Gastritis - protonix ordered for GI protection.  15. Left parotid gland lesion - Outpatient follow up recommended.  16. Essential hypertension - resuming home atenolol.  Holding home HCTZ and losartan for now.   DVT prophylaxis: enoxaparin  Code Status: full  Family Communication:  Disposition: TBD   Status is: Inpatient  Remains inpatient appropriate because:IV treatments appropriate due to intensity of illness or inability to take PO and Inpatient level of care appropriate due to severity of illness  Dispo: The patient is from: Home              Anticipated d/c is to: Home              Patient currently is not medically stable to d/c.   Difficult to place patient No  Consultants:   PT  Procedures:   N/A   Antimicrobials:  Cefepime 3/3>> Vancomycin 3/3>>  Subjective: Pt reports that her muscles are sore and aching but able to move extremities more although with significant pain and discomfort.    Objective: Vitals:   01/22/21 1100 01/22/21 1307 01/22/21 2000 01/23/21 0624  BP:  (!) 115/56 (!) 111/54 124/67  Pulse:  72 72 78  Resp:  18 18 18   Temp:  98.6 F (37 C) 98.3 F (36.8 C) 98.4 F (36.9 C)  TempSrc:  Oral Oral Oral  SpO2:  100% 100% 100%  Weight: 81.9 kg     Height: 5\' 6"  (1.676 m)       Intake/Output Summary (Last 24 hours) at 01/23/2021 1311 Last data filed at 01/23/2021 03/25/2021 Gross per 24 hour  Intake 2115.28 ml  Output 500 ml  Net 1615.28 ml   Filed Weights   01/21/21 1345 01/21/21 2317 01/22/21 1100  Weight: 80.3 kg 81.9 kg 81.9 kg    Examination:  General exam: frail, elderly female, Appears calm and comfortable  Respiratory system: Clear to auscultation. Respiratory effort normal. Cardiovascular system: normal S1 & S2 heard. No JVD, murmurs, rubs, gallops or clicks. No pedal edema. Gastrointestinal system: Abdomen is nondistended, soft and nontender. No organomegaly or masses felt. Normal bowel sounds heard. Central nervous system: Alert and oriented. No focal neurological deficits. Extremities: Symmetric 5 x 5 power. Skin: No rashes, lesions or ulcers Psychiatry: Judgement and insight appear poor. Mood & affect flat.   Data Reviewed: I have personally reviewed following labs and imaging studies  CBC: Recent Labs  Lab 01/21/21 1116  01/21/21 1731 01/21/21 2139 01/22/21 0105 01/23/21 0131  WBC 20.8*  --  16.6* 16.0* 10.8*  NEUTROABS 18.2*  --   --   --  8.4*  HGB 16.4* 12.2 13.2 13.0 11.5*  HCT 46.0 36.0 35.8* 38.0 32.9*  MCV 91.3  --  91.1 93.8 95.4  PLT 188  --  152 149* 126*    Basic Metabolic Panel: Recent Labs  Lab 01/21/21 1116 01/21/21 1708 01/21/21 1731 01/21/21 2139 01/22/21 0105 01/23/21 0131  NA 132*  --  136  --  134* 137  K 3.8 3.2* 3.1*  --  3.5 3.9  CL 98  --   --   --  109 115*  CO2 15*  --   --   --  14* 14*  GLUCOSE 142*  --   --   --  107* 89  BUN 29*  --   --   --  21 12  CREATININE 1.27*  --   --  0.96 0.86 0.66  CALCIUM 9.6  --   --   --  8.1* 7.9*  MG  --  1.5*  --   --  2.6* 1.9  PHOS  --  3.1  --   --   --   --     GFR: Estimated Creatinine Clearance: 74.6 mL/min (by C-G formula based on SCr of 0.66 mg/dL).  Liver Function Tests: Recent Labs  Lab 01/21/21 1116 01/22/21 0105 01/23/21 0131  AST 494* 284* 223*  ALT 160* 114* 109*  ALKPHOS 58 42 40  BILITOT 1.8* 1.9* 1.5*  PROT 7.2 5.4* 4.9*  ALBUMIN 3.8 2.8* 2.6*    CBG: Recent Labs  Lab 01/22/21 1154 01/22/21 1724 01/22/21 2005 01/23/21 0807 01/23/21 1143  GLUCAP 92 86 74 87 100*    Recent Results (from the past 240 hour(s))  Blood Cultures (routine x 2)     Status: None (Preliminary result)   Collection Time: 01/21/21 11:10 AM   Specimen: BLOOD  Result Value Ref Range Status   Specimen Description BLOOD RIGHT ANTECUBITAL  Final   Special Requests   Final    BOTTLES DRAWN AEROBIC AND ANAEROBIC Blood Culture results may not be optimal due to an excessive volume of blood received in culture bottles   Culture   Final    NO GROWTH 2 DAYS Performed at [facility], [address] [place], [place] [address]    Report Status PENDING  Incomplete  Blood Cultures (routine x 2)     Status: None (Preliminary result)   Collection Time: 01/21/21 11:16 AM   Specimen: BLOOD  Result Value Ref Range Status    Specimen Description BLOOD SITE NOT SPECIFIED  Final   Special Requests   Final    BOTTLES DRAWN AEROBIC AND ANAEROBIC Blood Culture results may not be optimal due to an inadequate volume of blood received in culture bottles   Culture  Setup Time   Final    GRAM POSITIVE COCCI IN CLUSTERS IN BOTH AEROBIC AND ANAEROBIC BOTTLES GRAM POSITIVE RODS ANAEROBIC BOTTLE ONLY Organism ID to follow CRITICAL RESULT CALLED TO, READ BACK BY AND VERIFIED WITH: PHARM D T.JACKIE AT 6195 ON 01/22/2021 BY T.SAAD Performed at [facility], [address], [place], [place] [address]    Culture GRAM POSITIVE RODS GRAM POSITIVE COCCI   Final   Report Status PENDING  Incomplete  Blood Culture ID Panel (Reflexed)     Status: Abnormal   Collection Time: 01/21/21 11:16 AM  Result Value Ref Range Status   Enterococcus faecalis NOT DETECTED NOT DETECTED Final   Enterococcus Faecium NOT DETECTED NOT DETECTED Final   Listeria monocytogenes NOT DETECTED NOT DETECTED Final   Staphylococcus species DETECTED (A) NOT DETECTED Final    Comment: CRITICAL RESULT CALLED TO, READ BACK BY AND VERIFIED WITH: PHARM D T.JACKIE AT 7124 ON 01/22/2021 BY T.SAAD.    Staphylococcus aureus (BCID) NOT DETECTED NOT DETECTED Final   Staphylococcus epidermidis NOT DETECTED NOT DETECTED Final   Staphylococcus lugdunensis NOT DETECTED NOT DETECTED Final   Streptococcus species NOT DETECTED NOT DETECTED Final   Streptococcus agalactiae NOT DETECTED NOT DETECTED Final   Streptococcus pneumoniae NOT DETECTED NOT DETECTED Final   Streptococcus pyogenes NOT DETECTED NOT DETECTED Final   A.calcoaceticus-baumannii NOT DETECTED NOT DETECTED Final   Bacteroides fragilis NOT DETECTED NOT DETECTED Final   Enterobacterales NOT DETECTED NOT DETECTED Final  Enterobacter cloacae complex NOT DETECTED NOT DETECTED Final   Escherichia coli NOT DETECTED NOT DETECTED Final   Klebsiella aerogenes NOT DETECTED NOT DETECTED Final   Klebsiella  oxytoca NOT DETECTED NOT DETECTED Final   Klebsiella pneumoniae NOT DETECTED NOT DETECTED Final   Proteus species NOT DETECTED NOT DETECTED Final   Salmonella species NOT DETECTED NOT DETECTED Final   Serratia marcescens NOT DETECTED NOT DETECTED Final   Haemophilus influenzae NOT DETECTED NOT DETECTED Final   Neisseria meningitidis NOT DETECTED NOT DETECTED Final   Pseudomonas aeruginosa NOT DETECTED NOT DETECTED Final   Stenotrophomonas maltophilia NOT DETECTED NOT DETECTED Final   Candida albicans NOT DETECTED NOT DETECTED Final   Candida auris NOT DETECTED NOT DETECTED Final   Candida glabrata NOT DETECTED NOT DETECTED Final   Candida krusei NOT DETECTED NOT DETECTED Final   Candida parapsilosis NOT DETECTED NOT DETECTED Final   Candida tropicalis NOT DETECTED NOT DETECTED Final   Cryptococcus neoformans/gattii NOT DETECTED NOT DETECTED Final    Comment: Performed at [facility], [address], [place], [place] [address]  Urine culture     Status: Abnormal (Preliminary result)   Collection Time: 01/21/21  1:28 PM   Specimen: In/Out Cath Urine  Result Value Ref Range Status   Specimen Description IN/OUT CATH URINE  Final   Special Requests NONE  Final   Culture (A)  Final    >=100,000 COLONIES/mL ESCHERICHIA COLI CULTURE REINCUBATED FOR BETTER GROWTH SUSCEPTIBILITIES TO FOLLOW Performed at [facility], [address] [place], [place] [address]    Report Status PENDING  Incomplete  Resp Panel by RT-PCR (Flu A&B, Covid) Nasopharyngeal Swab     Status: None   Collection Time: 01/21/21  5:01 PM   Specimen: Nasopharyngeal Swab; Nasopharyngeal(NP) swabs in vial transport medium  Result Value Ref Range Status   SARS Coronavirus 2 by RT PCR NEGATIVE NEGATIVE Final    Comment: (NOTE) SARS-CoV-2 target nucleic acids are NOT DETECTED.  The SARS-CoV-2 RNA is generally detectable in upper respiratory specimens during the acute phase of infection. The lowest concentration  of SARS-CoV-2 viral copies this assay can detect is 138 copies/mL. A negative result does not preclude SARS-Cov-2 infection and should not be used as the sole basis for treatment or other patient management decisions. A negative result may occur with  improper specimen collection/handling, submission of specimen other than nasopharyngeal swab, presence of viral mutation(s) within the areas targeted by this assay, and inadequate number of viral copies(<138 copies/mL). A negative result must be combined with clinical observations, patient history, and epidemiological information. The expected result is Negative.  Fact Sheet for Patients:  BloggerCourse.com  Fact Sheet for Healthcare Providers:  SeriousBroker.it  This test is no t yet approved or cleared by the [place] FDA and  has been authorized for detection and/or diagnosis of SARS-CoV-2 by FDA under an Emergency Use Authorization (EUA). This EUA will remain  in effect (meaning this test can be used) for the duration of the COVID-19 declaration under Section 564(b)(1) of the Act, 21 U.S.C.section 360bbb-3(b)(1), unless the authorization is terminated  or revoked sooner.       Influenza A by PCR NEGATIVE NEGATIVE Final   Influenza B by PCR NEGATIVE NEGATIVE Final    Comment: (NOTE) The Xpert Xpress SARS-CoV-2/FLU/RSV plus assay is intended as an aid in the diagnosis of influenza from Nasopharyngeal swab specimens and should not be used as a sole basis for treatment. Nasal washings and aspirates  are unacceptable for Xpert Xpress SARS-CoV-2/FLU/RSV testing.  Fact Sheet for Patients: BloggerCourse.com  Fact Sheet for Healthcare Providers: SeriousBroker.it  This test is not yet approved or cleared by the [place] FDA and has been authorized for detection and/or diagnosis of SARS-CoV-2 by FDA under an Emergency Use  Authorization (EUA). This EUA will remain in effect (meaning this test can be used) for the duration of the COVID-19 declaration under Section 564(b)(1) of the Act, 21 U.S.C. section 360bbb-3(b)(1), unless the authorization is terminated or revoked.  Performed at [facility], [address] [place], [place] [address]      Radiology Studies: CT HEAD WO CONTRAST  Result Date: 01/21/2021 CLINICAL DATA:  Recent fall with headaches and facial pain, initial encounter EXAM: CT HEAD WITHOUT CONTRAST CT CERVICAL SPINE WITHOUT CONTRAST TECHNIQUE: Multidetector CT imaging of the head and cervical spine was performed following the standard protocol without intravenous contrast. Multiplanar CT image reconstructions of the cervical spine were also generated. COMPARISON:  None. FINDINGS: CT HEAD FINDINGS Brain: No evidence of acute infarction, hemorrhage, hydrocephalus, extra-axial collection or mass lesion/mass effect. Vascular: No hyperdense vessel or unexpected calcification. Skull: Normal. Negative for fracture or focal lesion. Sinuses/Orbits: No acute finding. Other: Mild scalp hematoma is noted in the left posterior parietal region related to the recent injury. CT CERVICAL SPINE FINDINGS Alignment: Mild straightening of the normal cervical lordosis is noted likely related to muscular spasm. Skull base and vertebrae: 7 cervical segments are well visualized. Vertebral body height is well maintained. Mild osteophytic changes are noted primarily at C6-7 anteriorly. No acute fracture or acute facet abnormality is noted. The odontoid is within normal limits. Soft tissues and spinal canal: There is a 16 by 25 mm mildly hyperdense nodule identified in the posterior aspect of the left parotid gland. This was present on prior CT of the neck from 08/25/2020 and is somewhat smaller in size when compared with the prior exam. Upper chest: Visualized lung apices are within normal limits. Other: None IMPRESSION: CT of  the head: Mild scalp hematoma in the left posterior parietal region. No acute intracranial abnormality noted. CT of the cervical spine: Mild degenerative change of the cervical spine without acute bony abnormality. Left parotid lesion somewhat smaller and less heterogeneous when compared with the prior exam as described. ENT referral is again recommended if this has not been performed. Electronically Signed   By: Alcide Clever M.D.   On: 01/21/2021 14:43   CT CHEST W CONTRAST  Result Date: 01/21/2021 CLINICAL DATA:  Larey Seat. Left rib fractures on a portable chest earlier today. Abdominal trauma. EXAM: CT CHEST, ABDOMEN, AND PELVIS WITH CONTRAST TECHNIQUE: Multidetector CT imaging of the chest, abdomen and pelvis was performed following the standard protocol during bolus administration of intravenous contrast. CONTRAST:  OMNIPAQUE IOHEXOL 300 MG/ML  SOLN COMPARISON:  Portable chest obtained earlier today. FINDINGS: CT CHEST FINDINGS Cardiovascular: Atheromatous calcifications, including the coronary arteries and aorta. Normal sized heart. Minimal pericardial effusion with a maximum thickness of 7 mm. Mediastinum/Nodes: Very small thyroid gland or thyroid remnant. No enlarged lymph nodes. Unremarkable esophagus. Lungs/Pleura: Minimal bilateral dependent atelectasis. Otherwise, clear lungs. No pneumothorax or pleural fluid. Musculoskeletal: Left posterolateral acute 4th through 8th rib fractures. Varying degrees of displacement. No spinal fractures or subluxations. The sternum is intact. Thoracic and lower cervical spine degenerative changes. Diffuse osteopenia. CT ABDOMEN PELVIS FINDINGS Hepatobiliary: No focal liver abnormality is seen. No gallstones, gallbladder wall thickening, or biliary dilatation. Pancreas: Unremarkable. No pancreatic  ductal dilatation or surrounding inflammatory changes. Spleen: Normal in size without focal abnormality. Adrenals/Urinary Tract: Normal appearing adrenal glands. Small  bilateral renal cysts. Unremarkable ureters and urinary bladder. Stomach/Bowel: Moderate diffuse low density wall thickening involving the gastric antrum, pylorus, duodenal bulb and 2nd portion of the duodenum. Minimal adjacent soft tissue stranding. Moderate to marked luminal narrowing. Unremarkable colon and appendix. Vascular/Lymphatic: Atheromatous arterial calcifications without aneurysm. No enlarged lymph nodes. Reproductive: Status post hysterectomy. No adnexal masses. Other: Small umbilical hernia containing fat. Multiple small supraumbilical and infraumbilical ventral hernias containing fat. Musculoskeletal: Lumbar spine degenerative changes. These include facet degenerative changes with associated grade 1 anterolisthesis at the L4-5 level. No fractures, subluxations or dislocations. IMPRESSION: 1. Left posterolateral acute 4th through 8th rib fractures without pneumothorax. 2. Moderate diffuse low density wall thickening involving the gastric antrum, pylorus, duodenal bulb and 2nd portion of the duodenum with associated moderate to marked luminal narrowing. This is most likely due to gastritis and duodenitis. 3. Minimal pericardial effusion. 4. Multiple small supraumbilical and infraumbilical ventral hernias containing fat. 5. Calcific coronary artery and aortic atherosclerosis. Aortic Atherosclerosis (ICD10-I70.0). Electronically Signed   By: Beckie SaltsSteven  Reid M.D.   On: 01/21/2021 16:43   CT CERVICAL SPINE WO CONTRAST  Result Date: 01/21/2021 CLINICAL DATA:  Recent fall with headaches and facial pain, initial encounter EXAM: CT HEAD WITHOUT CONTRAST CT CERVICAL SPINE WITHOUT CONTRAST TECHNIQUE: Multidetector CT imaging of the head and cervical spine was performed following the standard protocol without intravenous contrast. Multiplanar CT image reconstructions of the cervical spine were also generated. COMPARISON:  None. FINDINGS: CT HEAD FINDINGS Brain: No evidence of acute infarction, hemorrhage,  hydrocephalus, extra-axial collection or mass lesion/mass effect. Vascular: No hyperdense vessel or unexpected calcification. Skull: Normal. Negative for fracture or focal lesion. Sinuses/Orbits: No acute finding. Other: Mild scalp hematoma is noted in the left posterior parietal region related to the recent injury. CT CERVICAL SPINE FINDINGS Alignment: Mild straightening of the normal cervical lordosis is noted likely related to muscular spasm. Skull base and vertebrae: 7 cervical segments are well visualized. Vertebral body height is well maintained. Mild osteophytic changes are noted primarily at C6-7 anteriorly. No acute fracture or acute facet abnormality is noted. The odontoid is within normal limits. Soft tissues and spinal canal: There is a 16 by 25 mm mildly hyperdense nodule identified in the posterior aspect of the left parotid gland. This was present on prior CT of the neck from 08/25/2020 and is somewhat smaller in size when compared with the prior exam. Upper chest: Visualized lung apices are within normal limits. Other: None IMPRESSION: CT of the head: Mild scalp hematoma in the left posterior parietal region. No acute intracranial abnormality noted. CT of the cervical spine: Mild degenerative change of the cervical spine without acute bony abnormality. Left parotid lesion somewhat smaller and less heterogeneous when compared with the prior exam as described. ENT referral is again recommended if this has not been performed. Electronically Signed   By: Alcide CleverMark  Lukens M.D.   On: 01/21/2021 14:43   CT ABDOMEN PELVIS W CONTRAST  Result Date: 01/21/2021 CLINICAL DATA:  Larey SeatFell. Left rib fractures on a portable chest earlier today. Abdominal trauma. EXAM: CT CHEST, ABDOMEN, AND PELVIS WITH CONTRAST TECHNIQUE: Multidetector CT imaging of the chest, abdomen and pelvis was performed following the standard protocol during bolus administration of intravenous contrast. CONTRAST:  100mL OMNIPAQUE IOHEXOL 300 MG/ML   SOLN COMPARISON:  Portable chest obtained earlier today. FINDINGS: CT CHEST FINDINGS Cardiovascular: Atheromatous calcifications,  including the coronary arteries and aorta. Normal sized heart. Minimal pericardial effusion with a maximum thickness of 7 mm. Mediastinum/Nodes: Very small thyroid gland or thyroid remnant. No enlarged lymph nodes. Unremarkable esophagus. Lungs/Pleura: Minimal bilateral dependent atelectasis. Otherwise, clear lungs. No pneumothorax or pleural fluid. Musculoskeletal: Left posterolateral acute 4th through 8th rib fractures. Varying degrees of displacement. No spinal fractures or subluxations. The sternum is intact. Thoracic and lower cervical spine degenerative changes. Diffuse osteopenia. CT ABDOMEN PELVIS FINDINGS Hepatobiliary: No focal liver abnormality is seen. No gallstones, gallbladder wall thickening, or biliary dilatation. Pancreas: Unremarkable. No pancreatic ductal dilatation or surrounding inflammatory changes. Spleen: Normal in size without focal abnormality. Adrenals/Urinary Tract: Normal appearing adrenal glands. Small bilateral renal cysts. Unremarkable ureters and urinary bladder. Stomach/Bowel: Moderate diffuse low density wall thickening involving the gastric antrum, pylorus, duodenal bulb and 2nd portion of the duodenum. Minimal adjacent soft tissue stranding. Moderate to marked luminal narrowing. Unremarkable colon and appendix. Vascular/Lymphatic: Atheromatous arterial calcifications without aneurysm. No enlarged lymph nodes. Reproductive: Status post hysterectomy. No adnexal masses. Other: Small umbilical hernia containing fat. Multiple small supraumbilical and infraumbilical ventral hernias containing fat. Musculoskeletal: Lumbar spine degenerative changes. These include facet degenerative changes with associated grade 1 anterolisthesis at the L4-5 level. No fractures, subluxations or dislocations. IMPRESSION: 1. Left posterolateral acute 4th through 8th rib  fractures without pneumothorax. 2. Moderate diffuse low density wall thickening involving the gastric antrum, pylorus, duodenal bulb and 2nd portion of the duodenum with associated moderate to marked luminal narrowing. This is most likely due to gastritis and duodenitis. 3. Minimal pericardial effusion. 4. Multiple small supraumbilical and infraumbilical ventral hernias containing fat. 5. Calcific coronary artery and aortic atherosclerosis. Aortic Atherosclerosis (ICD10-I70.0). Electronically Signed   By: Beckie Salts M.D.   On: 01/21/2021 16:43   Scheduled Meds: . acetaminophen  650 mg Oral Q6H  . atenolol  50 mg Oral Daily  . docusate sodium  100 mg Oral BID  . enoxaparin (LOVENOX) injection  40 mg Subcutaneous Q24H  . insulin aspart  0-9 Units Subcutaneous TID WC  . levothyroxine  100 mcg Oral Daily  . lidocaine  1 patch Transdermal Q24H  . methocarbamol  500 mg Oral TID  . pantoprazole  40 mg Oral BID   Continuous Infusions: . ceFEPime (MAXIPIME) IV 2 g (01/23/21 0953)  . sodium bicarbonate (isotonic) 150 mEq in D5W 1000 mL infusion 125 mL/hr at 01/23/21 1140  . vancomycin 1,500 mg (01/22/21 1341)    LOS: 2 days   Time spent: 35 mins   Dietra Stokely Laural Benes, MD How to contact the [facility] Attending or Consulting provider 7A - 7P or covering provider during after hours 7P -7A, for this patient?  1. Check the care team in [facility] and look for a) attending/consulting TRH provider listed and b) the [facility] team listed 2. Log into [email or web address] and use [place] universal password to access. If you do not have the password, please contact the hospital operator. 3. Locate the [facility] provider you are looking for under [facility] and page to a number that you can be directly reached. 4. If you still have difficulty reaching the provider, please page the [facility] (Director on Call) for the Hospitalists listed on amion for assistance.  01/23/2021, 1:11 PM

## 2021-01-24 DIAGNOSIS — E890 Postprocedural hypothyroidism: Secondary | ICD-10-CM | POA: Diagnosis not present

## 2021-01-24 DIAGNOSIS — I10 Essential (primary) hypertension: Secondary | ICD-10-CM | POA: Diagnosis not present

## 2021-01-24 DIAGNOSIS — T796XXA Traumatic ischemia of muscle, initial encounter: Secondary | ICD-10-CM

## 2021-01-24 DIAGNOSIS — E119 Type 2 diabetes mellitus without complications: Secondary | ICD-10-CM | POA: Diagnosis not present

## 2021-01-24 DIAGNOSIS — E1165 Type 2 diabetes mellitus with hyperglycemia: Secondary | ICD-10-CM | POA: Diagnosis not present

## 2021-01-24 LAB — CBC WITH DIFFERENTIAL/PLATELET
Abs Immature Granulocytes: 0.12 10*3/uL — ABNORMAL HIGH (ref 0.00–0.07)
Basophils Absolute: 0.1 10*3/uL (ref 0.0–0.1)
Basophils Relative: 1 %
Eosinophils Absolute: 0.1 10*3/uL (ref 0.0–0.5)
Eosinophils Relative: 1 %
HCT: 31 % — ABNORMAL LOW (ref 36.0–46.0)
Hemoglobin: 11.2 g/dL — ABNORMAL LOW (ref 12.0–15.0)
Immature Granulocytes: 2 %
Lymphocytes Relative: 19 %
Lymphs Abs: 1.6 10*3/uL (ref 0.7–4.0)
MCH: 33.4 pg (ref 26.0–34.0)
MCHC: 36.1 g/dL — ABNORMAL HIGH (ref 30.0–36.0)
MCV: 92.5 fL (ref 80.0–100.0)
Monocytes Absolute: 0.8 10*3/uL (ref 0.1–1.0)
Monocytes Relative: 9 %
Neutro Abs: 5.6 10*3/uL (ref 1.7–7.7)
Neutrophils Relative %: 68 %
Platelets: 134 10*3/uL — ABNORMAL LOW (ref 150–400)
RBC: 3.35 MIL/uL — ABNORMAL LOW (ref 3.87–5.11)
RDW: 12.1 % (ref 11.5–15.5)
WBC: 8.3 10*3/uL (ref 4.0–10.5)
nRBC: 0 % (ref 0.0–0.2)

## 2021-01-24 LAB — GLUCOSE, CAPILLARY
Glucose-Capillary: 148 mg/dL — ABNORMAL HIGH (ref 70–99)
Glucose-Capillary: 145 mg/dL — ABNORMAL HIGH (ref 70–99)
Glucose-Capillary: 139 mg/dL — ABNORMAL HIGH (ref 70–99)
Glucose-Capillary: 144 mg/dL — ABNORMAL HIGH (ref 70–99)
Glucose-Capillary: 127 mg/dL — ABNORMAL HIGH (ref 70–99)

## 2021-01-24 LAB — URINE CULTURE: Culture: >=100000 — AB

## 2021-01-24 LAB — COMPREHENSIVE METABOLIC PANEL
ALT: 103 U/L — ABNORMAL HIGH (ref 0–44)
AST: 171 U/L — ABNORMAL HIGH (ref 15–41)
Albumin: 2.6 g/dL — ABNORMAL LOW (ref 3.5–5.0)
Alkaline Phosphatase: 40 U/L (ref 38–126)
Anion gap: 7 (ref 5–15)
BUN: 8 mg/dL (ref 8–23)
CO2: 21 mmol/L — ABNORMAL LOW (ref 22–32)
Calcium: 8.1 mg/dL — ABNORMAL LOW (ref 8.9–10.3)
Chloride: 108 mmol/L (ref 98–111)
Creatinine, Ser: 0.56 mg/dL (ref 0.44–1.00)
GFR, Estimated: >60 mL/min (ref >60)
Glucose, Bld: 128 mg/dL — ABNORMAL HIGH (ref 70–99)
Potassium: 3.3 mmol/L — ABNORMAL LOW (ref 3.5–5.1)
Sodium: 136 mmol/L (ref 135–145)
Total Bilirubin: 1.2 mg/dL (ref 0.3–1.2)
Total Protein: 4.9 g/dL — ABNORMAL LOW (ref 6.5–8.1)

## 2021-01-24 LAB — CK: Total CK: 3938 U/L — ABNORMAL HIGH (ref 38–234)

## 2021-01-24 LAB — MAGNESIUM: Magnesium: 1.6 mg/dL — ABNORMAL LOW (ref 1.7–2.4)

## 2021-01-24 MED ORDER — MAGNESIUM SULFATE 4 GM/100ML IV SOLN
4.0000 g | Freq: Once | INTRAVENOUS | Status: AC
  Administered 2021-01-24: 4 g via INTRAVENOUS
  Filled 2021-01-24: qty 100

## 2021-01-24 MED ORDER — POTASSIUM CHLORIDE CRYS ER 20 MEQ PO TBCR
40.0000 meq | EXTENDED_RELEASE_TABLET | Freq: Two times a day (BID) | ORAL | Status: AC
  Administered 2021-01-24 (×2): 40 meq via ORAL
  Filled 2021-01-24 (×2): qty 2

## 2021-01-24 MED ORDER — HYDROCERIN EX CREA
TOPICAL_CREAM | Freq: Two times a day (BID) | CUTANEOUS | Status: DC
  Administered 2021-01-30: 1 via TOPICAL
  Filled 2021-01-24: qty 113

## 2021-01-24 NOTE — Progress Notes (Signed)
PROGRESS NOTE  Kristina Gates  WUJ:[id number] DOB: [birth year] DOA: 01/21/2021 PCP: Patient, No Pcp Per   Chief Complaint  Patient presents with  . Altered Mental Status  . Fall   Level of care: Telemetry Medical  Brief Admission History:  65 y.o. female with PMH significant for HTN, DM 2, HL who was in her usual state of excellent health until about 4 to 5 days ago when she felt weak was not sure why she felt weak.  She states that she apparently had difficulty walking around and tried to rest.  She remembers at one point someone was knocking at the door but she was too tired to get it so did not.  She apparently fell but does not remember falling.  She does remember lying on the floor intermittently but has very little memory of the events.  She states that she texts her sister every morning and apparently her sister had not heard from her in 2 days so asked the police for a welfare check.  The police found her down on the floor covered in urine.  Patient is unable to provide a review of systems that she does not really remember much of what has been going on.  She does remember feeling very weak and tired.  He does not think she has had any fevers or chills but she is not sure.  Does not think she has had a cough or shortness of breath but is not sure.  ED Course:  The patient was noted to be acutely confused, normotensive with unlabored tachypnea.  T-max was 99.3.  She had a GCS of 14 when she came in.  Patient underwent extensive work-up which was notable for UTI, lactic acidosis, significant rhabdomyolysis with CPK of 26K, acute kidney injury.  Head CT was negative for any intracranial bleed or abnormality.  X-ray does reveal 4 consecutive rib fractures posteriorly without pneumothorax.  She was treated for sepsis with aggressive fluid resuscitation and initiation of broad-spectrum antibiotics with vancomycin and cefepime and Flagyl.  Her mental status improved steadily with treatment of  infection and with hydration.  However while she was being treated patient had acute episode of SVT with heart rate of 206.  This was aborted promptly with 6 mg of adenosine.  Assessment & Plan:   Active Problems:   Essential hypertension   Hyperglycemia due to type 2 diabetes mellitus (HCC)   Postoperative hypothyroidism   Pure hypercholesterolemia   Sepsis (HCC)   Traumatic rhabdomyolysis (HCC)   Diabetes mellitus without complication (HCC)  1. Staphylococcal Sepsis - BC positive for staphylococcus species, awaiting ID and sensitivities.  Continue supportive measures and broad spectrum antibiotics. ID consult requested to help narrow treatment.  2. Nontraumatic rhabdomyolysis - CK trending down with aggressive hydration, continue to follow CK level for now.  Try to ambulate with PT.  3. Metabolic acidosis - changed IV fluid to sodium bicarbonate solution.   4. E coli UTI - awaiting C&S findings. Continue cefepime/vancomycin until we have sensitivities back. .  5. Staphylococcus Bacteremia - continue broad spectrum coverage pending ID and sensitivities. Repeating blood cultures 01/23/21.  6. Rib fracture -continue conservative treatment.  PT eval recommending CIR.   7. SVT - Treated with adenosine in ED with resolution.  Resume home atenolol.  Cardiac monitor.  8. Metabolic acidosis - added sodium bicarbonate IV fluid with good results.   9. Hypokalemia/Hypomagnesemia - oral and IV replacement was given.  10. Elevated LFTs - possibly related to  rhabdomyolysis.  Acute hepatitis panel negative.  Follow. Continue supportive measures.  11. Leukocytosis - WBC normalized today.  12. Elevated troponin - suspect elevated in setting of rhabdomyolysis.  Do not suspect ACS.  13. AKI - RESOLVED with IV fluid hydration. .   14. Gastritis - protonix ordered for GI protection.  15. Left parotid gland lesion - Outpatient follow up recommended.  16. Essential hypertension - resuming home atenolol.  Holding  home HCTZ and losartan for now.   DVT prophylaxis: enoxaparin  Code Status: full  Family Communication:  Disposition: anticipating CIR placement   Status is: Inpatient  Remains inpatient appropriate because:IV treatments appropriate due to intensity of illness or inability to take PO and Inpatient level of care appropriate due to severity of illness  Dispo: The patient is from: Home              Anticipated d/c is to: Home              Patient currently is not medically stable to d/c.   Difficult to place patient No  Consultants:   PT  ID  Procedures:   N/A   Antimicrobials:  Cefepime 3/3>> Vancomycin 3/3>>  Subjective: Pt reports that her muscles ache but she is able to move more freely today than yesterday.      Objective: Vitals:   01/23/21 1454 01/23/21 2041 01/24/21 0452 01/24/21 0911  BP: 127/67 (!) 117/57 131/69 135/75  Pulse: 69 64 65 67  Resp: Temp: 98.1 F (36.7 C) 98.2 F (36.8 C) 98.1 F (36.7 C) 97.7 F (36.5 C)  TempSrc: Oral  Oral   SpO2: 100% 100% 100% 99%  Weight:      Height:        Intake/Output Summary (Last 24 hours) at 01/24/2021 1127 Last data filed at 01/24/2021 7829 Gross per 24 hour  Intake 2399.39 ml  Output 1000 ml  Net 1399.39 ml   Filed Weights   01/21/21 1345 01/21/21 2317 01/22/21 1100  Weight: 80.3 kg 81.9 kg 81.9 kg    Examination:  General exam: frail, elderly female, Appears calm and comfortable  Respiratory system: Clear to auscultation. Respiratory effort normal. Cardiovascular system: normal S1 & S2 heard. No JVD, murmurs, rubs, gallops or clicks. No pedal edema. Gastrointestinal system: Abdomen is nondistended, soft and nontender. No organomegaly or masses felt. Normal bowel sounds heard. Central nervous system: Alert and oriented. No focal neurological deficits. Extremities: muscles atrophied.  Skin: abrasions on knees and elbows.  Psychiatry: Judgement and insight appear normal. Mood & affect  appear normal.   Data Reviewed: I have personally reviewed following labs and imaging studies  CBC: Recent Labs  Lab 01/21/21 1116 01/21/21 1731 01/21/21 2139 01/22/21 0105 01/23/21 0131 01/24/21 0049  WBC 20.8*  --  16.6* 16.0* 10.8* 8.3  NEUTROABS 18.2*  --   --   --  8.4* 5.6  HGB 16.4* 12.2 13.2 13.0 11.5* 11.2*  HCT 46.0 36.0 35.8* 38.0 32.9* 31.0*  MCV 91.3  --  91.1 93.8 95.4 92.5  PLT 188  --  152 149* 126* 134*    Basic Metabolic Panel: Recent Labs  Lab 01/21/21 1116 01/21/21 1708 01/21/21 1731 01/21/21 2139 01/22/21 0105 01/23/21 0131 01/24/21 0049  NA 132*  --  136  --  134* 137 136  K 3.8 3.2* 3.1*  --  3.5 3.9 3.3*  CL 98  --   --   --  109 115* 108  CO2 15*  --   --   --  14* 14* 21*  GLUCOSE 142*  --   --   --  107* 89 128*  BUN 29*  --   --   --  21 12 8   CREATININE 1.27*  --   --  0.96 0.86 0.66 0.56  CALCIUM 9.6  --   --   --  8.1* 7.9* 8.1*  MG  --  1.5*  --   --  2.6* 1.9 1.6*  PHOS  --  3.1  --   --   --   --   --     GFR: Estimated Creatinine Clearance: 74.6 mL/min (by C-G formula based on SCr of 0.56 mg/dL).  Liver Function Tests: Recent Labs  Lab 01/21/21 1116 01/22/21 0105 01/23/21 0131 01/24/21 0049  AST 494* 284* 223* 171*  ALT 160* 114* 109* 103*  ALKPHOS 58 42 40 40  BILITOT 1.8* 1.9* 1.5* 1.2  PROT 7.2 5.4* 4.9* 4.9*  ALBUMIN 3.8 2.8* 2.6* 2.6*    CBG: Recent Labs  Lab 01/23/21 0807 01/23/21 1143 01/23/21 1655 01/23/21 2043 01/24/21 0818  GLUCAP 87 100* 91 124* 148*    Recent Results (from the past 240 hour(s))  Blood Cultures (routine x 2)     Status: None (Preliminary result)   Collection Time: 01/21/21 11:10 AM   Specimen: BLOOD  Result Value Ref Range Status   Specimen Description BLOOD RIGHT ANTECUBITAL  Final   Special Requests   Final    BOTTLES DRAWN AEROBIC AND ANAEROBIC Blood Culture results may not be optimal due to an excessive volume of blood received in culture bottles   Culture   Final    NO  GROWTH 3 DAYS Performed at [facility], [address], [place], [place]    Report Status PENDING  Incomplete  Blood Cultures (routine x 2)     Status: Abnormal (Preliminary result)   Collection Time: 01/21/21 11:16 AM   Specimen: BLOOD  Result Value Ref Range Status   Specimen Description BLOOD SITE NOT SPECIFIED  Final   Special Requests   Final    BOTTLES DRAWN AEROBIC AND ANAEROBIC Blood Culture results may not be optimal due to an inadequate volume of blood received in culture bottles   Culture  Setup Time   Final    GRAM POSITIVE COCCI IN CLUSTERS IN BOTH AEROBIC AND ANAEROBIC BOTTLES GRAM POSITIVE RODS ANAEROBIC BOTTLE ONLY Organism ID to follow CRITICAL RESULT CALLED TO, READ BACK BY AND VERIFIED WITH: PHARM D T.JACKIE AT 03/23/21 ON 01/22/2021 BY T.SAAD Performed at [facility], [address] [place], [place]    Culture STAPHYLOCOCCUS HOMINIS GRAM POSITIVE RODS  (A)  Final   Report Status PENDING  Incomplete  Blood Culture ID Panel (Reflexed)     Status: Abnormal   Collection Time: 01/21/21 11:16 AM  Result Value Ref Range Status   Enterococcus faecalis NOT DETECTED NOT DETECTED Final   Enterococcus Faecium NOT DETECTED NOT DETECTED Final   Listeria monocytogenes NOT DETECTED NOT DETECTED Final   Staphylococcus species DETECTED (A) NOT DETECTED Final    Comment: CRITICAL RESULT CALLED TO, READ BACK BY AND VERIFIED WITH: PHARM D T.JACKIE AT 03/23/21 ON 01/22/2021 BY T.SAAD.    Staphylococcus aureus (BCID) NOT DETECTED NOT DETECTED Final   Staphylococcus epidermidis NOT DETECTED NOT DETECTED Final   Staphylococcus lugdunensis NOT DETECTED NOT DETECTED Final   Streptococcus species NOT DETECTED NOT  DETECTED Final   Streptococcus agalactiae NOT DETECTED NOT DETECTED Final   Streptococcus pneumoniae NOT DETECTED NOT DETECTED Final   Streptococcus pyogenes NOT DETECTED NOT DETECTED Final   A.calcoaceticus-baumannii NOT DETECTED NOT DETECTED Final    Bacteroides fragilis NOT DETECTED NOT DETECTED Final   Enterobacterales NOT DETECTED NOT DETECTED Final   Enterobacter cloacae complex NOT DETECTED NOT DETECTED Final   Escherichia coli NOT DETECTED NOT DETECTED Final   Klebsiella aerogenes NOT DETECTED NOT DETECTED Final   Klebsiella oxytoca NOT DETECTED NOT DETECTED Final   Klebsiella pneumoniae NOT DETECTED NOT DETECTED Final   Proteus species NOT DETECTED NOT DETECTED Final   Salmonella species NOT DETECTED NOT DETECTED Final   Serratia marcescens NOT DETECTED NOT DETECTED Final   Haemophilus influenzae NOT DETECTED NOT DETECTED Final   Neisseria meningitidis NOT DETECTED NOT DETECTED Final   Pseudomonas aeruginosa NOT DETECTED NOT DETECTED Final   Stenotrophomonas maltophilia NOT DETECTED NOT DETECTED Final   Candida albicans NOT DETECTED NOT DETECTED Final   Candida auris NOT DETECTED NOT DETECTED Final   Candida glabrata NOT DETECTED NOT DETECTED Final   Candida krusei NOT DETECTED NOT DETECTED Final   Candida parapsilosis NOT DETECTED NOT DETECTED Final   Candida tropicalis NOT DETECTED NOT DETECTED Final   Cryptococcus neoformans/gattii NOT DETECTED NOT DETECTED Final    Comment: Performed at [facility], [address] [place], [place] [address]  Urine culture     Status: Abnormal   Collection Time: 01/21/21  1:28 PM   Specimen: In/Out Cath Urine  Result Value Ref Range Status   Specimen Description IN/OUT CATH URINE  Final   Special Requests   Final    NONE Performed at [facility]  Lab, [address] [place], [place] [address]    Culture >=100,000 COLONIES/mL ESCHERICHIA COLI (A)  Final   Report Status 01/24/2021 FINAL  Final   Organism ID, Bacteria ESCHERICHIA COLI (A)  Final      Susceptibility   Escherichia coli - MIC*    AMPICILLIN 8 SENSITIVE Sensitive     CEFAZOLIN <=4 SENSITIVE Sensitive     CEFEPIME <=0.12 SENSITIVE Sensitive     CEFTRIAXONE <=0.25 SENSITIVE Sensitive     CIPROFLOXACIN <=0.25  SENSITIVE Sensitive     GENTAMICIN <=1 SENSITIVE Sensitive     IMIPENEM <=0.25 SENSITIVE Sensitive     NITROFURANTOIN <=16 SENSITIVE Sensitive     TRIMETH/SULFA <=20 SENSITIVE Sensitive     AMPICILLIN/SULBACTAM <=2 SENSITIVE Sensitive     PIP/TAZO <=4 SENSITIVE Sensitive     * >=100,000 COLONIES/mL ESCHERICHIA COLI  Resp Panel by RT-PCR (Flu A&B, Covid) Nasopharyngeal Swab     Status: None   Collection Time: 01/21/21  5:01 PM   Specimen: Nasopharyngeal Swab; Nasopharyngeal(NP) swabs in vial transport medium  Result Value Ref Range Status   SARS Coronavirus 2 by RT PCR NEGATIVE NEGATIVE Final    Comment: (NOTE) SARS-CoV-2 target nucleic acids are NOT DETECTED.  The SARS-CoV-2 RNA is generally detectable in upper respiratory specimens during the acute phase of infection. The lowest concentration of SARS-CoV-2 viral copies this assay can detect is 138 copies/mL. A negative result does not preclude SARS-Cov-2 infection and should not be used as the sole basis for treatment or other patient management decisions. A negative result may occur with  improper specimen collection/handling, submission of specimen other than nasopharyngeal swab, presence of viral mutation(s) within the areas targeted by this assay, and inadequate number of viral copies(<138 copies/mL). A negative  result must be combined with clinical observations, patient history, and epidemiological information. The expected result is Negative.  Fact Sheet for Patients:  BloggerCourse.com  Fact Sheet for Healthcare Providers:  SeriousBroker.it  This test is no t yet approved or cleared by the [place] FDA and  has been authorized for detection and/or diagnosis of SARS-CoV-2 by FDA under an Emergency Use Authorization (EUA). This EUA will remain  in effect (meaning this test can be used) for the duration of the COVID-19 declaration under Section 564(b)(1) of the Act,  21 U.S.C.section 360bbb-3(b)(1), unless the authorization is terminated  or revoked sooner.       Influenza A by PCR NEGATIVE NEGATIVE Final   Influenza B by PCR NEGATIVE NEGATIVE Final    Comment: (NOTE) The Xpert Xpress SARS-CoV-2/FLU/RSV plus assay is intended as an aid in the diagnosis of influenza from Nasopharyngeal swab specimens and should not be used as a sole basis for treatment. Nasal washings and aspirates are unacceptable for Xpert Xpress SARS-CoV-2/FLU/RSV testing.  Fact Sheet for Patients: BloggerCourse.com  Fact Sheet for Healthcare Providers: SeriousBroker.it  This test is not yet approved or cleared by the [place] FDA and has been authorized for detection and/or diagnosis of SARS-CoV-2 by FDA under an Emergency Use Authorization (EUA). This EUA will remain in effect (meaning this test can be used) for the duration of the COVID-19 declaration under Section 564(b)(1) of the Act, 21 U.S.C. section 360bbb-3(b)(1), unless the authorization is terminated or revoked.  Performed at [facility], [address] [place], [place] [address]   Culture, blood (Routine X 2) w Reflex to ID Panel     Status: None (Preliminary result)   Collection Time: 01/23/21  1:24 PM   Specimen: BLOOD LEFT HAND  Result Value Ref Range Status   Specimen Description BLOOD LEFT HAND  Final   Special Requests   Final    BOTTLES DRAWN AEROBIC AND ANAEROBIC Blood Culture results may not be optimal due to an inadequate volume of blood received in culture bottles   Culture   Final    NO GROWTH < 24 HOURS Performed at [facility], [address], [place], [place] [address]    Report Status PENDING  Incomplete  Culture, blood (Routine X 2) w Reflex to ID Panel     Status: None (Preliminary result)   Collection Time: 01/23/21  1:24 PM   Specimen: BLOOD RIGHT HAND  Result Value Ref Range Status   Specimen Description BLOOD  RIGHT HAND  Final   Special Requests   Final    BOTTLES DRAWN AEROBIC AND ANAEROBIC Blood Culture results may not be optimal due to an inadequate volume of blood received in culture bottles   Culture   Final    NO GROWTH < 24 HOURS Performed at [facility], [address], [place], [place] [address]    Report Status PENDING  Incomplete     Radiology Studies: No results found. Scheduled Meds: . acetaminophen  650 mg Oral Q6H  . atenolol  50 mg Oral Daily  . docusate sodium  100 mg Oral BID  . enoxaparin (LOVENOX) injection  40 mg Subcutaneous Q24H  . hydrocerin   Topical BID  . insulin aspart  0-9 Units Subcutaneous TID WC  . levothyroxine  100 mcg Oral Daily  . lidocaine  1 patch Transdermal Q24H  . methocarbamol  500 mg Oral TID  . pantoprazole  40 mg Oral BID  . potassium  chloride  40 mEq Oral BID   Continuous Infusions: . ceFEPime (MAXIPIME) IV 2 g (01/24/21 0928)  . sodium bicarbonate (isotonic) 150 mEq in D5W 1000 mL infusion 125 mL/hr at 01/24/21 0709  . vancomycin 1,500 mg (01/23/21 1519)    LOS: 3 days   Time spent: 38 mins   Treyson Axel Laural Benes, MD How to contact the [facility] Attending or Consulting provider 7A - 7P or covering provider during after hours 7P -7A, for this patient?  1. Check the care team in [facility] and look for a) attending/consulting TRH provider listed and b) the [facility] team listed 2. Log into [email or web address] and use [place]'s universal password to access. If you do not have the password, please contact the hospital operator. 3. Locate the [facility] provider you are looking for under [facility] and page to a number that you can be directly reached. 4. If you still have difficulty reaching the provider, please page the [facility] (Director on Call) for the Hospitalists listed on amion for assistance.  01/24/2021, 11:27 AM

## 2021-01-24 NOTE — Evaluation (Signed)
Physical Therapy Evaluation Patient Details Name: Kristina Gates MRN: [id number] DOB: 11-16-1956 Today's Date: 01/24/2021   History of Present Illness  65 y.o. female admitted on 01/21/2021 with  urosepsis/AKI after being found down at home. Work up also revealed rhabdo and multiple left rib fx in setting of presumed ground level fall.  Clinical Impression  Orders received for PT evaluation. Patient demonstrates deficits in functional mobility as indicated below. Will benefit from continued skilled PT to address deficits and maximize function. Will see as indicated and progress as tolerated.  At this time, biggest limiting factor is fear of pain/movement.  Anticipate this will improve as patient continues with mobilization. Given current deficits and previous level of function, will recommend post acute inpatient rehabilitation.    Follow Up Recommendations CIR    Equipment Recommendations  Rolling walker with 5" wheels    Recommendations for Other Services Rehab consult     Precautions / Restrictions Precautions Precautions: Fall Precaution Comments: left rib fx      Mobility  Bed Mobility Overal bed mobility: Needs Assistance Bed Mobility: Supine to Sit     Supine to sit: +2 for physical assistance;Min assist     General bed mobility comments: Min assist to elevate trunk to upright with cues for initiation of LEs movement to EOB. Initial posterior list, cued for position. Mild light headedness with transitional movement, acclimated with rest    Transfers Overall transfer level: Needs assistance Equipment used: 2 person hand held assist Transfers: Lateral/Scoot Transfers          Lateral/Scoot Transfers: Mod assist;+2 physical assistance General transfer comment: Moderate assist more for patient comfort and less for physical assist. VCs for sequencing and positioning. Performed lateral transition to drop arm with multi-step transition.  Ambulation/Gait              General Gait Details: deferred at this time  Stairs            Wheelchair Mobility    Modified Rankin (Stroke Patients Only)       Balance Overall balance assessment: Needs assistance;History of Falls Sitting-balance support: Feet supported;Bilateral upper extremity supported Sitting balance-Leahy Scale: Poor Sitting balance - Comments: patient required external support initially and VCs for anterior tilt (posterior list). Able to maintain without physical assist once established at midline                                     Pertinent Vitals/Pain Pain Assessment: Faces Faces Pain Scale: Hurts even more Pain Location: left chest Pain Descriptors / Indicators: Aching Pain Intervention(s): Monitored during session    Home Living Family/patient expects to be discharged to:: Private residence Living Arrangements: Alone   Type of Home: House Home Access: Stairs to enter   [employer] of Steps: thresold Home Layout: Two level;Able to live on main level with bedroom/bathroom        Prior Function Level of Independence: Independent         Comments: patient reports complete independence with all activities     Hand Dominance   Dominant Hand: Right    Extremity/Trunk Assessment   Upper Extremity Assessment Upper Extremity Assessment: LUE deficits/detail LUE Deficits / Details: limited ROM and strength due to pain in left shoulder/chest    Lower Extremity Assessment Lower Extremity Assessment: Generalized weakness    Cervical / Trunk Assessment Cervical / Trunk Assessment: Other exceptions Cervical / Trunk  Exceptions: increased body habitus  Communication      Cognition Arousal/Alertness: Awake/alert Behavior During Therapy: Anxious Overall Cognitive Status: Within Functional Limits for tasks assessed                                 General Comments: slow processing/initiation in setting of fear/anxiety;  conversational, aware of admission, did not perform any high level cognitive assessment at this time      General Comments      Exercises     Assessment/Plan    PT Assessment Patient needs continued PT services  PT Problem List Decreased strength;Decreased activity tolerance;Decreased balance;Decreased mobility;Decreased coordination       PT Treatment Interventions Functional mobility training;Therapeutic activities;Therapeutic exercise;Balance training;Gait training;Patient/family education    PT Goals (Current goals can be found in the Care Plan section)  Acute Rehab PT Goals Patient Stated Goal: to get better PT Goal Formulation: With patient Time For Goal Achievement: 02/07/21 Potential to Achieve Goals: Good    Frequency Min 3X/week   Barriers to discharge        Co-evaluation               AM-PAC PT "6 Clicks" Mobility  Outcome Measure Help needed turning from your back to your side while in a flat bed without using bedrails?: A Little Help needed moving from lying on your back to sitting on the side of a flat bed without using bedrails?: A Lot Help needed moving to and from a bed to a chair (including a wheelchair)?: A Lot Help needed standing up from a chair using your arms (e.g., wheelchair or bedside chair)?: A Lot Help needed to walk in hospital room?: A Lot Help needed climbing 3-5 steps with a railing? : Total 6 Click Score: 12    End of Session Equipment Utilized During Treatment: Oxygen Activity Tolerance: Patient limited by fatigue;Patient tolerated treatment well;Other (comment) Patient left: in chair;with call bell/phone within reach;with chair alarm set Nurse Communication: Mobility status PT Visit Diagnosis: Muscle weakness (generalized) (M62.81);Difficulty in walking, not elsewhere classified (R26.2)    Time: 6433-2951 PT Time Calculation (min) (ACUTE ONLY): 22 min   Charges:   PT Evaluation $PT Eval Moderate Complexity: 1 Mod           Charlotte Crumb, PT DPT  Board Certified Neurologic Specialist Acute Rehabilitation Services Office [phone]   Fabio Asa 01/24/2021, 11:07 AM

## 2021-01-24 NOTE — Consult Note (Signed)
[facility]       Reason for Consult: rhabdomyolysis    Referring Physician: Dr. Laural Benes  Active Problems:   Essential hypertension   Hyperglycemia due to type 2 diabetes mellitus (HCC)   Postoperative hypothyroidism   Pure hypercholesterolemia   Sepsis (HCC)   Traumatic rhabdomyolysis (HCC)   Diabetes mellitus without complication (HCC)   . acetaminophen  650 mg Oral Q6H  . atenolol  50 mg Oral Daily  . docusate sodium  100 mg Oral BID  . enoxaparin (LOVENOX) injection  40 mg Subcutaneous Q24H  . hydrocerin   Topical BID  . insulin aspart  0-9 Units Subcutaneous TID WC  . levothyroxine  100 mcg Oral Daily  . lidocaine  1 patch Transdermal Q24H  . methocarbamol  500 mg Oral TID  . pantoprazole  40 mg Oral BID  . potassium chloride  40 mEq Oral BID    Recommendations: Stop antibiotics Continue work up for non-infectious etiologies   Assessment: She has been altered and weak and likely down for 3-4 days at home of unknown etiology.  She does have a positive blood culture that is most c/w a contaminate and a positive urine culture but likely is asymptomatic bacteruria.  She though has already had sufficient treatment for this so will stop antibiotics.  I do not equate weakness, AMS, SVT and other issues with a simple UTI, this would not be typical.  I would recommend further evaluation for this ongoing weakness and other symptoms.  The differential includes hypoglycemic episode with decreased insulin needs exacerbated by acute diarrhea/dehydration, thyroid issues, ongoing cardiac arrhythmia, among other etiologies.  Additionally, she has a hyperdense nodule of her parotid gland of unknown significance.    Antibiotics: Vancomycin and cefepime.    HPI: Kristina Gates is a 65 y.o. female with a history of DM, HTN, postablative hypothyroidism, who was doing well until about 4 days prior to admission.  Her sister had not heard from the patient so  called [employer] and did a well check and she was found down. She reported feeling weakness and laid down and was unable to get up.  She was able to get up some but does not recall much of the events during that time.  She tells me she has not felt well for some time, though she could not elaborate on how long she has felt poorly.  She has had notable lightheadedness for some time making it more difficult to get around.  She has had no medication changes, though working with her doctor there was consideration of stopping her daily 70/30 insulin due to good blood sugar control.  She was having some signficant diarrhea prior to this episode.     On admission she was noted to have severe rhabdomyolysis with a CK of 26,000 and also with an acute episode of SVT requiring adenosine.  She has improved some in regards to AMS and CK trending down.  She had blood and urine cultures sent and one set of blood cultures with Staph hominis and GPR and the urine culture with E coli.  She does not recall any urinary symptoms or fever but was placed on empiric antibiotics on admission.  She was found to have fractured ribs from falling as well.  PT evaluation has noted ongoing deficits with functional mobility which is different from her baseline.  Her initial blood sugar was not low on admission though no EMS record to see if it  was low on initial evaluation at home.    Review of Systems:  Constitutional: negative for fevers and chills Cardiovascular: positive for pleuritic chest pain Gastrointestinal: positive for diarrhea, negative for nausea Genitourinary: negative for frequency and dysuria Integument/breast: negative for rash Neurological: positive for dizziness and memory problems, negative for headaches All other systems reviewed and are negative    Past Medical History:  Diagnosis Date  . Diabetes mellitus without complication (HCC)   . Hypercholesteremia   . Hypertension   . Postprocedural hypothyroidism      Social History   Tobacco Use  . Smoking status: Never Smoker  . Smokeless tobacco: Never Used    FMH: + cardiac disease  Allergies  Allergen Reactions  . Metformin And Related Cough  . Zestril [Lisinopril] Cough    Physical Exam: Constitutional: in no apparent distress  Vitals:   01/24/21 0452 01/24/21 0911  BP: 131/69 135/75  Pulse: 65 67  Resp: 18 20  Temp: 98.1 F (36.7 C) 97.7 F (36.5 C)  SpO2: 100% 99%   EYES: anicteric Cardiovascular: Cor RRR Respiratory: clear; GI: Bowel sounds are normal, liver is not enlarged, spleen is not enlarged Musculoskeletal: no pedal edema noted Skin: negatives: no rash Neuro: non-focal  Lab Results  Component Value Date   WBC 8.3 01/24/2021   HGB 11.2 (L) 01/24/2021   HCT 31.0 (L) 01/24/2021   MCV 92.5 01/24/2021   PLT 134 (L) 01/24/2021    Lab Results  Component Value Date   CREATININE 0.56 01/24/2021   BUN 8 01/24/2021   NA 136 01/24/2021   K 3.3 (L) 01/24/2021   CL 108 01/24/2021   CO2 21 (L) 01/24/2021    Lab Results  Component Value Date   ALT 103 (H) 01/24/2021   AST 171 (H) 01/24/2021   ALKPHOS 40 01/24/2021     Microbiology: Recent Results (from the past 240 hour(s))  Blood Cultures (routine x 2)     Status: None (Preliminary result)   Collection Time: 01/21/21 11:10 AM   Specimen: BLOOD  Result Value Ref Range Status   Specimen Description BLOOD RIGHT ANTECUBITAL  Final   Special Requests   Final    BOTTLES DRAWN AEROBIC AND ANAEROBIC Blood Culture results may not be optimal due to an excessive volume of blood received in culture bottles   Culture   Final    NO GROWTH 3 DAYS Performed at [facility], [address], [place], [place] [address]    Report Status PENDING  Incomplete  Blood Cultures (routine x 2)     Status: Abnormal (Preliminary result)   Collection Time: 01/21/21 11:16 AM   Specimen: BLOOD  Result Value Ref Range Status   Specimen Description BLOOD SITE NOT SPECIFIED   Final   Special Requests   Final    BOTTLES DRAWN AEROBIC AND ANAEROBIC Blood Culture results may not be optimal due to an inadequate volume of blood received in culture bottles   Culture  Setup Time   Final    GRAM POSITIVE COCCI IN CLUSTERS IN BOTH AEROBIC AND ANAEROBIC BOTTLES GRAM POSITIVE RODS ANAEROBIC BOTTLE ONLY Organism ID to follow CRITICAL RESULT CALLED TO, READ BACK BY AND VERIFIED WITH: PHARM D T.JACKIE AT 6045 ON 01/22/2021 BY T.SAAD Performed at [facility], [address], [place], [place] [address]    Culture STAPHYLOCOCCUS HOMINIS GRAM POSITIVE RODS  (A)  Final   Report Status PENDING  Incomplete  Blood Culture ID Panel (Reflexed)  Status: Abnormal   Collection Time: 01/21/21 11:16 AM  Result Value Ref Range Status   Enterococcus faecalis NOT DETECTED NOT DETECTED Final   Enterococcus Faecium NOT DETECTED NOT DETECTED Final   Listeria monocytogenes NOT DETECTED NOT DETECTED Final   Staphylococcus species DETECTED (A) NOT DETECTED Final    Comment: CRITICAL RESULT CALLED TO, READ BACK BY AND VERIFIED WITH: PHARM D T.JACKIE AT [id number] ON 01/22/2021 BY T.SAAD.    Staphylococcus aureus (BCID) NOT DETECTED NOT DETECTED Final   Staphylococcus epidermidis NOT DETECTED NOT DETECTED Final   Staphylococcus lugdunensis NOT DETECTED NOT DETECTED Final   Streptococcus species NOT DETECTED NOT DETECTED Final   Streptococcus agalactiae NOT DETECTED NOT DETECTED Final   Streptococcus pneumoniae NOT DETECTED NOT DETECTED Final   Streptococcus pyogenes NOT DETECTED NOT DETECTED Final   A.calcoaceticus-baumannii NOT DETECTED NOT DETECTED Final   Bacteroides fragilis NOT DETECTED NOT DETECTED Final   Enterobacterales NOT DETECTED NOT DETECTED Final   Enterobacter cloacae complex NOT DETECTED NOT DETECTED Final   Escherichia coli NOT DETECTED NOT DETECTED Final   Klebsiella aerogenes NOT DETECTED NOT DETECTED Final   Klebsiella oxytoca NOT DETECTED NOT DETECTED Final    Klebsiella pneumoniae NOT DETECTED NOT DETECTED Final   Proteus species NOT DETECTED NOT DETECTED Final   Salmonella species NOT DETECTED NOT DETECTED Final   Serratia marcescens NOT DETECTED NOT DETECTED Final   Haemophilus influenzae NOT DETECTED NOT DETECTED Final   Neisseria meningitidis NOT DETECTED NOT DETECTED Final   Pseudomonas aeruginosa NOT DETECTED NOT DETECTED Final   Stenotrophomonas maltophilia NOT DETECTED NOT DETECTED Final   Candida albicans NOT DETECTED NOT DETECTED Final   Candida auris NOT DETECTED NOT DETECTED Final   Candida glabrata NOT DETECTED NOT DETECTED Final   Candida krusei NOT DETECTED NOT DETECTED Final   Candida parapsilosis NOT DETECTED NOT DETECTED Final   Candida tropicalis NOT DETECTED NOT DETECTED Final   Cryptococcus neoformans/gattii NOT DETECTED NOT DETECTED Final    Comment: Performed at [facility], [address] [place], [place] [address]  Urine culture     Status: Abnormal   Collection Time: 01/21/21  1:28 PM   Specimen: In/Out Cath Urine  Result Value Ref Range Status   Specimen Description IN/OUT CATH URINE  Final   Special Requests   Final    NONE Performed at [facility], [address] [place], [place] [address]    Culture >=100,000 COLONIES/mL ESCHERICHIA COLI (A)  Final   Report Status 01/24/2021 FINAL  Final   Organism ID, Bacteria ESCHERICHIA COLI (A)  Final      Susceptibility   Escherichia coli - MIC*    AMPICILLIN 8 SENSITIVE Sensitive     CEFAZOLIN <=4 SENSITIVE Sensitive     CEFEPIME <=0.12 SENSITIVE Sensitive     CEFTRIAXONE <=0.25 SENSITIVE Sensitive     CIPROFLOXACIN <=0.25 SENSITIVE Sensitive     GENTAMICIN <=1 SENSITIVE Sensitive     IMIPENEM <=0.25 SENSITIVE Sensitive     NITROFURANTOIN <=16 SENSITIVE Sensitive     TRIMETH/SULFA <=20 SENSITIVE Sensitive     AMPICILLIN/SULBACTAM <=2 SENSITIVE Sensitive     PIP/TAZO <=4 SENSITIVE Sensitive     * >=100,000 COLONIES/mL ESCHERICHIA COLI  Resp Panel  by RT-PCR (Flu A&B, Covid) Nasopharyngeal Swab     Status: None   Collection Time: 01/21/21  5:01 PM   Specimen: Nasopharyngeal Swab; Nasopharyngeal(NP) swabs in vial transport medium  Result Value Ref Range Status   SARS Coronavirus 2  by RT PCR NEGATIVE NEGATIVE Final    Comment: (NOTE) SARS-CoV-2 target nucleic acids are NOT DETECTED.  The SARS-CoV-2 RNA is generally detectable in upper respiratory specimens during the acute phase of infection. The lowest concentration of SARS-CoV-2 viral copies this assay can detect is 138 copies/mL. A negative result does not preclude SARS-Cov-2 infection and should not be used as the sole basis for treatment or other patient management decisions. A negative result may occur with  improper specimen collection/handling, submission of specimen other than nasopharyngeal swab, presence of viral mutation(s) within the areas targeted by this assay, and inadequate number of viral copies(<138 copies/mL). A negative result must be combined with clinical observations, patient history, and epidemiological information. The expected result is Negative.  Fact Sheet for Patients:  BloggerCourse.com  Fact Sheet for Healthcare Providers:  SeriousBroker.it  This test is no t yet approved or cleared by the [place] FDA and  has been authorized for detection and/or diagnosis of SARS-CoV-2 by FDA under an Emergency Use Authorization (EUA). This EUA will remain  in effect (meaning this test can be used) for the duration of the COVID-19 declaration under Section 564(b)(1) of the Act, 21 U.S.C.section 360bbb-3(b)(1), unless the authorization is terminated  or revoked sooner.       Influenza A by PCR NEGATIVE NEGATIVE Final   Influenza B by PCR NEGATIVE NEGATIVE Final    Comment: (NOTE) The Xpert Xpress SARS-CoV-2/FLU/RSV plus assay is intended as an aid in the diagnosis of influenza from Nasopharyngeal swab  specimens and should not be used as a sole basis for treatment. Nasal washings and aspirates are unacceptable for Xpert Xpress SARS-CoV-2/FLU/RSV testing.  Fact Sheet for Patients: BloggerCourse.com  Fact Sheet for Healthcare Providers: SeriousBroker.it  This test is not yet approved or cleared by the [place] FDA and has been authorized for detection and/or diagnosis of SARS-CoV-2 by FDA under an Emergency Use Authorization (EUA). This EUA will remain in effect (meaning this test can be used) for the duration of the COVID-19 declaration under Section 564(b)(1) of the Act, 21 U.S.C. section 360bbb-3(b)(1), unless the authorization is terminated or revoked.  Performed at [facility], [address] [place], [place] [address]   Culture, blood (Routine X 2) w Reflex to ID Panel     Status: None (Preliminary result)   Collection Time: 01/23/21  1:24 PM   Specimen: BLOOD LEFT HAND  Result Value Ref Range Status   Specimen Description BLOOD LEFT HAND  Final   Special Requests   Final    BOTTLES DRAWN AEROBIC AND ANAEROBIC Blood Culture results may not be optimal due to an inadequate volume of blood received in culture bottles   Culture   Final    NO GROWTH < 24 HOURS Performed at [facility], [address] [place], [place] [address]    Report Status PENDING  Incomplete  Culture, blood (Routine X 2) w Reflex to ID Panel     Status: None (Preliminary result)   Collection Time: 01/23/21  1:24 PM   Specimen: BLOOD RIGHT HAND  Result Value Ref Range Status   Specimen Description BLOOD RIGHT HAND  Final   Special Requests   Final    BOTTLES DRAWN AEROBIC AND ANAEROBIC Blood Culture results may not be optimal due to an inadequate volume of blood received in culture bottles   Culture   Final    NO GROWTH < 24 HOURS Performed at [facility], [address] [place], [place] [address]    Report Status PENDING   Incomplete    Gardiner Barefoot, MD [facility] [email or web address] 01/24/2021, 1:09 PM

## 2021-01-24 NOTE — Consult Note (Signed)
WOC Nurse Consult Note: Reason for Consult: WOC Nursing requested for provision of guidance for care of superficial/partial thickness skin injury after patient crawled to sofa after fall elbows>knees. Wound type: trauma, pressure Pressure Injury POA: Yes Measurement: Affected areas to be measured by bedside RN (LxWxD in cm) and documented on Nursing Flow Sheet today prior to placement of first topical agents. Wound bed: erythematous knees, partial thickness skin loss to elbows with maceration and pink moist wound bed. Drainage (amount, consistency, odor) Small serous from elbows, dry knees Periwound: intact Dressing procedure/placement/frequency: I have provided Nursing with guidance for the care of these areas and additionally provided pressure injury prevention interventions such as a sacral prophylactic foam dressing placement and bilateral pressure redistribution heel boots for when patient is in bed and a pressure redistribution chair pad for when patient is OOB in chair. Xeroform gauze will both cover and aid in wound healing of the elbows.  Eucerin cream twice daily to the LE and knees will foster tissue repair.  WOC nursing team will not follow, but will remain available to this patient, the nursing and medical teams.  Please re-consult if needed. Thanks, Ladona Mow, MSN, RN, GNP, [place]  Pager# [phone]

## 2021-01-25 ENCOUNTER — Inpatient Hospital Stay (HOSPITAL_COMMUNITY): Payer: Medicare HMO

## 2021-01-25 DIAGNOSIS — E890 Postprocedural hypothyroidism: Secondary | ICD-10-CM

## 2021-01-25 DIAGNOSIS — R55 Syncope and collapse: Secondary | ICD-10-CM | POA: Diagnosis not present

## 2021-01-25 DIAGNOSIS — E119 Type 2 diabetes mellitus without complications: Secondary | ICD-10-CM | POA: Diagnosis not present

## 2021-01-25 DIAGNOSIS — E1165 Type 2 diabetes mellitus with hyperglycemia: Secondary | ICD-10-CM | POA: Diagnosis not present

## 2021-01-25 DIAGNOSIS — T796XXA Traumatic ischemia of muscle, initial encounter: Secondary | ICD-10-CM | POA: Diagnosis not present

## 2021-01-25 DIAGNOSIS — I471 Supraventricular tachycardia: Secondary | ICD-10-CM

## 2021-01-25 DIAGNOSIS — I10 Essential (primary) hypertension: Secondary | ICD-10-CM | POA: Diagnosis not present

## 2021-01-25 LAB — CBC WITH DIFFERENTIAL/PLATELET
Abs Immature Granulocytes: 0.1 10*3/uL — ABNORMAL HIGH (ref 0.00–0.07)
Basophils Absolute: 0 10*3/uL (ref 0.0–0.1)
Basophils Relative: 0 %
Eosinophils Absolute: 0.1 10*3/uL (ref 0.0–0.5)
Eosinophils Relative: 1 %
HCT: 34.3 % — ABNORMAL LOW (ref 36.0–46.0)
Hemoglobin: 12 g/dL (ref 12.0–15.0)
Immature Granulocytes: 1 %
Lymphocytes Relative: 23 %
Lymphs Abs: 1.6 10*3/uL (ref 0.7–4.0)
MCH: 32.3 pg (ref 26.0–34.0)
MCHC: 35 g/dL (ref 30.0–36.0)
MCV: 92.5 fL (ref 80.0–100.0)
Monocytes Absolute: 0.8 10*3/uL (ref 0.1–1.0)
Monocytes Relative: 12 %
Neutro Abs: 4.3 10*3/uL (ref 1.7–7.7)
Neutrophils Relative %: 63 %
Platelets: 160 10*3/uL (ref 150–400)
RBC: 3.71 MIL/uL — ABNORMAL LOW (ref 3.87–5.11)
RDW: 11.9 % (ref 11.5–15.5)
WBC: 6.9 10*3/uL (ref 4.0–10.5)
nRBC: 0 % (ref 0.0–0.2)

## 2021-01-25 LAB — GLUCOSE, CAPILLARY
Glucose-Capillary: 145 mg/dL — ABNORMAL HIGH (ref 70–99)
Glucose-Capillary: 135 mg/dL — ABNORMAL HIGH (ref 70–99)
Glucose-Capillary: 123 mg/dL — ABNORMAL HIGH (ref 70–99)
Glucose-Capillary: 124 mg/dL — ABNORMAL HIGH (ref 70–99)

## 2021-01-25 LAB — ECHOCARDIOGRAM COMPLETE
AV Mean grad: 4 mmHg
AV Peak grad: 7.5 mmHg
Ao pk vel: 1.37 m/s
Area-P 1/2: 3.77 cm2
Height: 66 in
S' Lateral: 3 cm
Weight: 2888.91 oz

## 2021-01-25 LAB — COMPREHENSIVE METABOLIC PANEL
ALT: 90 U/L — ABNORMAL HIGH (ref 0–44)
AST: 107 U/L — ABNORMAL HIGH (ref 15–41)
Albumin: 2.5 g/dL — ABNORMAL LOW (ref 3.5–5.0)
Alkaline Phosphatase: 42 U/L (ref 38–126)
Anion gap: 8 (ref 5–15)
BUN: <5 mg/dL — ABNORMAL LOW (ref 8–23)
CO2: 29 mmol/L (ref 22–32)
Calcium: 8.1 mg/dL — ABNORMAL LOW (ref 8.9–10.3)
Chloride: 100 mmol/L (ref 98–111)
Creatinine, Ser: 0.51 mg/dL (ref 0.44–1.00)
GFR, Estimated: >60 mL/min (ref >60)
Glucose, Bld: 141 mg/dL — ABNORMAL HIGH (ref 70–99)
Potassium: 3.6 mmol/L (ref 3.5–5.1)
Sodium: 137 mmol/L (ref 135–145)
Total Bilirubin: 0.6 mg/dL (ref 0.3–1.2)
Total Protein: 4.9 g/dL — ABNORMAL LOW (ref 6.5–8.1)

## 2021-01-25 LAB — CK: Total CK: 1829 U/L — ABNORMAL HIGH (ref 38–234)

## 2021-01-25 LAB — CULTURE, BLOOD (ROUTINE X 2): Culture: NO GROWTH

## 2021-01-25 LAB — MAGNESIUM: Magnesium: 1.8 mg/dL (ref 1.7–2.4)

## 2021-01-25 NOTE — Progress Notes (Addendum)
Subjective: No new complaints   Antibiotics:  Anti-infectives (From admission, onward)   Start     Dose/Rate Gates Frequency Ordered Stop   01/22/21 1730  ceFEPIme (MAXIPIME) 2 g in sodium chloride 0.9 % 100 mL IVPB  Status:  Discontinued        2 g 200 mL/hr over 30 Minutes Intravenous Every 8 hours 01/22/21 1137 01/24/21 1402   01/22/21 1400  vancomycin (VANCOREADY) IVPB 1000 mg/200 mL  Status:  Discontinued        1,000 mg 200 mL/hr over 60 Minutes Intravenous Every 24 hours 01/21/21 1306 01/22/21 1137   01/22/21 1400  vancomycin (VANCOREADY) IVPB 1500 mg/300 mL  Status:  Discontinued        1,500 mg 150 mL/hr over 120 Minutes Intravenous Every 24 hours 01/22/21 1137 01/24/21 1402   01/21/21 2200  ceFEPIme (MAXIPIME) 2 g in sodium chloride 0.9 % 100 mL IVPB  Status:  Discontinued        2 g 200 mL/hr over 30 Minutes Intravenous Every 12 hours 01/21/21 1306 01/22/21 1137   01/21/21 1300  vancomycin (VANCOREADY) IVPB 1750 mg/350 mL        1,750 mg 175 mL/hr over 120 Minutes Intravenous  Once 01/21/21 1249 01/21/21 1705   01/21/21 1245  ceFEPIme (MAXIPIME) 2 g in sodium chloride 0.9 % 100 mL IVPB        2 g 200 mL/hr over 30 Minutes Intravenous  Once 01/21/21 1236 01/21/21 1328   01/21/21 1245  metroNIDAZOLE (FLAGYL) IVPB 500 mg        500 mg 100 mL/hr over 60 Minutes Intravenous  Once 01/21/21 1236 01/21/21 1405   01/21/21 1245  vancomycin (VANCOREADY) IVPB 1000 mg/200 mL  Status:  Discontinued        1,000 mg 200 mL/hr over 60 Minutes Intravenous  Once 01/21/21 1236 01/21/21 1249      Medications: Scheduled Meds: . acetaminophen  650 mg Oral Q6H  . atenolol  50 mg Oral Daily  . docusate sodium  100 mg Oral BID  . enoxaparin (LOVENOX) injection  40 mg Subcutaneous Q24H  . hydrocerin   Topical BID  . insulin aspart  0-9 Units Subcutaneous TID WC  . levothyroxine  100 mcg Oral Daily  . lidocaine  1 patch Transdermal Q24H  . methocarbamol  500 mg Oral TID  .  pantoprazole  40 mg Oral BID   Continuous Infusions: . sodium bicarbonate (isotonic) 150 mEq in D5W 1000 mL infusion 75 mL/hr at 01/25/21 0835   PRN Meds:.HYDROcodone-acetaminophen, ketorolac, polyethylene glycol, sodium chloride    Objective: Weight change:   Intake/Output Summary (Last 24 hours) at 01/25/2021 1435 Last data filed at 01/25/2021 0800 Gross per 24 hour  Intake 2310 ml  Output 1300 ml  Net 1010 ml   Blood pressure 139/74, pulse 70, temperature 98.6 F (37 C), temperature source Oral, resp. rate 16, height 5\' 6"  (1.676 m), weight 81.9 kg, SpO2 95 %. Temp:  [98 F (36.7 C)-98.7 F (37.1 C)] 98.6 F (37 C) (03/07 1429) Pulse Rate:  [67-75] 70 (03/07 1429) Resp:  [16-22] 16 (03/07 1429) BP: (130-142)/(70-75) 139/74 (03/07 1429) SpO2:  [93 %-96 %] 95 % (03/07 1429)  Physical Exam: Physical Exam Constitutional:      General: She is not in acute distress.    Appearance: She is well-developed and well-nourished. She is not diaphoretic.  HENT:     Head: Normocephalic and atraumatic.  Right Ear: External ear normal.     Left Ear: External ear normal.     Mouth/Throat:     Mouth: Oropharynx is clear and moist.     Pharynx: No oropharyngeal exudate.  Eyes:     General: No scleral icterus.    Extraocular Movements: EOM normal.     Conjunctiva/sclera: Conjunctivae normal.     Pupils: Pupils are equal, round, and reactive to light.  Cardiovascular:     Rate and Rhythm: Normal rate and regular rhythm.  Pulmonary:     Effort: Pulmonary effort is normal. No respiratory distress.     Breath sounds: Normal breath sounds. No wheezing.  Abdominal:     General: Bowel sounds are normal. There is no distension.     Tenderness: There is no rebound.  Musculoskeletal:        General: No tenderness or edema. Normal range of motion.  Lymphadenopathy:     Cervical: No cervical adenopathy.  Skin:    General: Skin is warm and dry.     Coloration: Skin is not pale.      Findings: No erythema or rash.  Neurological:     General: No focal deficit present.     Mental Status: She is alert and oriented to person, place, and time.     Motor: No abnormal muscle tone.     Coordination: Coordination normal.  Psychiatric:        Mood and Affect: Mood and affect and mood normal.        Behavior: Behavior normal.        Thought Content: Thought content normal.        Judgment: Judgment normal.      CBC:    BMET Recent Labs    01/24/21 0049 01/25/21 0545  NA 136 137  K 3.3* 3.6  CL 108 100  CO2 21* 29  GLUCOSE 128* 141*  BUN 8 <5*  CREATININE 0.56 0.51  CALCIUM 8.1* 8.1*     Liver Panel  Recent Labs    01/24/21 0049 01/25/21 0545  PROT 4.9* 4.9*  ALBUMIN 2.6* 2.5*  AST 171* 107*  ALT 103* 90*  ALKPHOS 40 42  BILITOT 1.2 0.6       Sedimentation Rate No results for input(s): ESRSEDRATE in the last 72 hours. C-Reactive Protein No results for input(s): CRP in the last 72 hours.  Micro Results: Recent Results (from the past 720 hour(s))  Blood Cultures (routine x 2)     Status: None (Preliminary result)   Collection Time: 01/21/21 11:10 AM   Specimen: BLOOD  Result Value Ref Range Status   Specimen Description BLOOD RIGHT ANTECUBITAL  Final   Special Requests   Final    BOTTLES DRAWN AEROBIC AND ANAEROBIC Blood Culture results may not be optimal due to an excessive volume of blood received in culture bottles   Culture   Final    NO GROWTH 4 DAYS Performed at [facility], [address] [place], [place] [address]    Report Status PENDING  Incomplete  Blood Cultures (routine x 2)     Status: Abnormal (Preliminary result)   Collection Time: 01/21/21 11:16 AM   Specimen: BLOOD  Result Value Ref Range Status   Specimen Description BLOOD SITE NOT SPECIFIED  Final   Special Requests   Final    BOTTLES DRAWN AEROBIC AND ANAEROBIC Blood Culture results may not be optimal due to an inadequate volume of blood received in  culture  bottles   Culture  Setup Time   Final    GRAM POSITIVE COCCI IN CLUSTERS IN BOTH AEROBIC AND ANAEROBIC BOTTLES GRAM POSITIVE RODS ANAEROBIC BOTTLE ONLY Organism ID to follow CRITICAL RESULT CALLED TO, READ BACK BY AND VERIFIED WITH: PHARM D T.JACKIE AT 8841 ON 01/22/2021 BY T.SAAD    Culture (A)  Final    STAPHYLOCOCCUS HOMINIS GRAM POSITIVE RODS CULTURE REINCUBATED FOR BETTER GROWTH Performed at [facility], [address] [place], [place] [address]    Report Status PENDING  Incomplete  Blood Culture ID Panel (Reflexed)     Status: Abnormal   Collection Time: 01/21/21 11:16 AM  Result Value Ref Range Status   Enterococcus faecalis NOT DETECTED NOT DETECTED Final   Enterococcus Faecium NOT DETECTED NOT DETECTED Final   Listeria monocytogenes NOT DETECTED NOT DETECTED Final   Staphylococcus species DETECTED (A) NOT DETECTED Final    Comment: CRITICAL RESULT CALLED TO, READ BACK BY AND VERIFIED WITH: PHARM D T.JACKIE AT 0160 ON 01/22/2021 BY T.SAAD.    Staphylococcus aureus (BCID) NOT DETECTED NOT DETECTED Final   Staphylococcus epidermidis NOT DETECTED NOT DETECTED Final   Staphylococcus lugdunensis NOT DETECTED NOT DETECTED Final   Streptococcus species NOT DETECTED NOT DETECTED Final   Streptococcus agalactiae NOT DETECTED NOT DETECTED Final   Streptococcus pneumoniae NOT DETECTED NOT DETECTED Final   Streptococcus pyogenes NOT DETECTED NOT DETECTED Final   A.calcoaceticus-baumannii NOT DETECTED NOT DETECTED Final   Bacteroides fragilis NOT DETECTED NOT DETECTED Final   Enterobacterales NOT DETECTED NOT DETECTED Final   Enterobacter cloacae complex NOT DETECTED NOT DETECTED Final   Escherichia coli NOT DETECTED NOT DETECTED Final   Klebsiella aerogenes NOT DETECTED NOT DETECTED Final   Klebsiella oxytoca NOT DETECTED NOT DETECTED Final   Klebsiella pneumoniae NOT DETECTED NOT DETECTED Final   Proteus species NOT DETECTED NOT DETECTED Final   Salmonella  species NOT DETECTED NOT DETECTED Final   Serratia marcescens NOT DETECTED NOT DETECTED Final   Haemophilus influenzae NOT DETECTED NOT DETECTED Final   Neisseria meningitidis NOT DETECTED NOT DETECTED Final   Pseudomonas aeruginosa NOT DETECTED NOT DETECTED Final   Stenotrophomonas maltophilia NOT DETECTED NOT DETECTED Final   Candida albicans NOT DETECTED NOT DETECTED Final   Candida auris NOT DETECTED NOT DETECTED Final   Candida glabrata NOT DETECTED NOT DETECTED Final   Candida krusei NOT DETECTED NOT DETECTED Final   Candida parapsilosis NOT DETECTED NOT DETECTED Final   Candida tropicalis NOT DETECTED NOT DETECTED Final   Cryptococcus neoformans/gattii NOT DETECTED NOT DETECTED Final    Comment: Performed at [facility], [address] [place], [place] [address]  Urine culture     Status: Abnormal   Collection Time: 01/21/21  1:28 PM   Specimen: In/Out Cath Urine  Result Value Ref Range Status   Specimen Description IN/OUT CATH URINE  Final   Special Requests   Final    NONE Performed at [facility], [address] [place], [place] [address]    Culture >=100,000 COLONIES/mL ESCHERICHIA COLI (A)  Final   Report Status 01/24/2021 FINAL  Final   Organism ID, Bacteria ESCHERICHIA COLI (A)  Final      Susceptibility   Escherichia coli - MIC*    AMPICILLIN 8 SENSITIVE Sensitive     CEFAZOLIN <=4 SENSITIVE Sensitive     CEFEPIME <=0.12 SENSITIVE Sensitive     CEFTRIAXONE <=0.25 SENSITIVE Sensitive     CIPROFLOXACIN <=0.25 SENSITIVE Sensitive  GENTAMICIN <=1 SENSITIVE Sensitive     IMIPENEM <=0.25 SENSITIVE Sensitive     NITROFURANTOIN <=16 SENSITIVE Sensitive     TRIMETH/SULFA <=20 SENSITIVE Sensitive     AMPICILLIN/SULBACTAM <=2 SENSITIVE Sensitive     PIP/TAZO <=4 SENSITIVE Sensitive     * >=100,000 COLONIES/mL ESCHERICHIA COLI  Resp Panel by RT-PCR (Flu A&B, Covid) Nasopharyngeal Swab     Status: None   Collection Time: 01/21/21  5:01 PM   Specimen:  Nasopharyngeal Swab; Nasopharyngeal(NP) swabs in vial transport medium  Result Value Ref Range Status   SARS Coronavirus 2 by RT PCR NEGATIVE NEGATIVE Final    Comment: (NOTE) SARS-CoV-2 target nucleic acids are NOT DETECTED.  The SARS-CoV-2 RNA is generally detectable in upper respiratory specimens during the acute phase of infection. The lowest concentration of SARS-CoV-2 viral copies this assay can detect is 138 copies/mL. A negative result does not preclude SARS-Cov-2 infection and should not be used as the sole basis for treatment or other patient management decisions. A negative result may occur with  improper specimen collection/handling, submission of specimen other than nasopharyngeal swab, presence of viral mutation(s) within the areas targeted by this assay, and inadequate number of viral copies(<138 copies/mL). A negative result must be combined with clinical observations, patient history, and epidemiological information. The expected result is Negative.  Fact Sheet for Patients:  BloggerCourse.com  Fact Sheet for Healthcare Providers:  SeriousBroker.it  This test is no t yet approved or cleared by the [place] FDA and  has been authorized for detection and/or diagnosis of SARS-CoV-2 by FDA under an Emergency Use Authorization (EUA). This EUA will remain  in effect (meaning this test can be used) for the duration of the COVID-19 declaration under Section 564(b)(1) of the Act, 21 U.S.C.section 360bbb-3(b)(1), unless the authorization is terminated  or revoked sooner.       Influenza A by PCR NEGATIVE NEGATIVE Final   Influenza B by PCR NEGATIVE NEGATIVE Final    Comment: (NOTE) The Xpert Xpress SARS-CoV-2/FLU/RSV plus assay is intended as an aid in the diagnosis of influenza from Nasopharyngeal swab specimens and should not be used as a sole basis for treatment. Nasal washings and aspirates are unacceptable for  Xpert Xpress SARS-CoV-2/FLU/RSV testing.  Fact Sheet for Patients: BloggerCourse.com  Fact Sheet for Healthcare Providers: SeriousBroker.it  This test is not yet approved or cleared by the [place] FDA and has been authorized for detection and/or diagnosis of SARS-CoV-2 by FDA under an Emergency Use Authorization (EUA). This EUA will remain in effect (meaning this test can be used) for the duration of the COVID-19 declaration under Section 564(b)(1) of the Act, 21 U.S.C. section 360bbb-3(b)(1), unless the authorization is terminated or revoked.  Performed at [facility], [address], [place], [place] [address]   Culture, blood (Routine X 2) w Reflex to ID Panel     Status: None (Preliminary result)   Collection Time: 01/23/21  1:24 PM   Specimen: BLOOD LEFT HAND  Result Value Ref Range Status   Specimen Description BLOOD LEFT HAND  Final   Special Requests   Final    BOTTLES DRAWN AEROBIC AND ANAEROBIC Blood Culture results may not be optimal due to an inadequate volume of blood received in culture bottles   Culture   Final    NO GROWTH 2 DAYS Performed at [facility], [address] [place], [place] [address]    Report Status PENDING  Incomplete  Culture, blood (Routine X  2) w Reflex to ID Panel     Status: None (Preliminary result)   Collection Time: 01/23/21  1:24 PM   Specimen: BLOOD RIGHT HAND  Result Value Ref Range Status   Specimen Description BLOOD RIGHT HAND  Final   Special Requests   Final    BOTTLES DRAWN AEROBIC AND ANAEROBIC Blood Culture results may not be optimal due to an inadequate volume of blood received in culture bottles   Culture   Final    NO GROWTH 2 DAYS Performed at [facility], [address], [place], [place] [address]    Report Status PENDING  Incomplete    Studies/Results: No results found.    Assessment/Plan:  INTERVAL HISTORY: CPK levels tender to  improve   Active Problems:   Essential hypertension   Hyperglycemia due to type 2 diabetes mellitus (HCC)   Postoperative hypothyroidism   Pure hypercholesterolemia   Sepsis (HCC)   Traumatic rhabdomyolysis (HCC)   Diabetes mellitus without complication (HCC)    Kristina Gates is a 65 y.o. female with who apparently fell at home and was down for only 4 days.  She was brought into the emergency department and found to be with rhabdomyolysis with a CPK of over 26,000 and SVT requiring adenosine.  She grew contaminants from blood culture with a staph hominis and gram-positive rod in 1 site but nothing growing in the other.  Urine culture grew E. coli.  Does not recall specific urinary symptoms.  Not clear to me at all that she had an infection that caused her to fall.  We have more than adequately treated her urine if it had anything to do with her decompensation at home.  As mentioned her blood cultures with coagulase-negative staph and gram-positive rod growing on 1 site but not the other are consistent with contamination.  Would continue to observe her off antibiotics and ensure that other potential causes of her fall have been worked up especially possible cardiogenic ones. I will sign off please call with further questions   LOS: 4 days   Acey Lav 01/25/2021, 2:35 PM

## 2021-01-25 NOTE — Progress Notes (Addendum)
PROGRESS NOTE  Kristina Gates  JJK:[id number] DOB: [birth year] DOA: 01/21/2021 PCP: Patient, No Pcp Per   Chief Complaint  Patient presents with  . Altered Mental Status  . Fall   Level of care: Telemetry Medical  Brief Admission History:  65 y.o. female with PMH significant for HTN, DM 2, HL who was in her usual state of excellent health until about 4 to 5 days ago when she felt weak was not sure why she felt weak.  She states that she apparently had difficulty walking around and tried to rest.  She remembers at one point someone was knocking at the door but she was too tired to get it so did not.  She apparently fell but does not remember falling.  She does remember lying on the floor intermittently but has very little memory of the events.  She states that she texts her sister every morning and apparently her sister had not heard from her in 2 days so asked the police for a welfare check.  The police found her down on the floor covered in urine.  Patient is unable to provide a review of systems that she does not really remember much of what has been going on.  She does remember feeling very weak and tired.  He does not think she has had any fevers or chills but she is not sure.  Does not think she has had a cough or shortness of breath but is not sure.  ED Course:  The patient was noted to be acutely confused, normotensive with unlabored tachypnea.  T-max was 99.3.  She had a GCS of 14 when she came in.  Patient underwent extensive work-up which was notable for UTI, lactic acidosis, significant rhabdomyolysis with CPK of 26K, acute kidney injury.  Head CT was negative for any intracranial bleed or abnormality.  X-ray does reveal 4 consecutive rib fractures posteriorly without pneumothorax.  She was treated for sepsis with aggressive fluid resuscitation and initiation of broad-spectrum antibiotics with vancomycin and cefepime and Flagyl.  Her mental status improved steadily with treatment of  infection and with hydration.  However while she was being treated patient had acute episode of SVT with heart rate of 206.  This was aborted promptly with 6 mg of adenosine.  Assessment & Plan:   Active Problems:   Essential hypertension   Hyperglycemia due to type 2 diabetes mellitus (HCC)   Postoperative hypothyroidism   Pure hypercholesterolemia   Sepsis (HCC)   Traumatic rhabdomyolysis (HCC)   Diabetes mellitus without complication (HCC)  1. Sepsis - resolved.  ID consult appreciated.  Off all antibiotics now.  2. Nontraumatic rhabdomyolysis - CK trending down with aggressive hydration, continue to follow CK level for now.  Continue PT.  3. Metabolic acidosis - improved on sodium bicarbonate solution.   4. E coli UTI - Treated.  5. Staphylococcus Bacteremia - likely contaminant, agree with ID team.  Repeated blood cultures 01/23/21 NGTD.  6. Rib fracture -continue conservative treatment.  PT eval recommending CIR.   7. SVT - Treated with adenosine in ED with resolution.  Resumed home atenolol.  No recurrence.  Cardiac monitor stable.  Check 2D echo.  8. Metabolic acidosis - added sodium bicarbonate IV fluid with good results.   9. Hypokalemia/Hypomagnesemia - Repleted. Oral and IV replacement was given.  10. Elevated LFTs - likely related to rhabdomyolysis.  Acute hepatitis panel negative.  Follow. Continue supportive measures.   11. Leukocytosis - WBC normalized.  12. Elevated  troponin - suspect elevated in setting of rhabdomyolysis.  Do not suspect ACS.  13. AKI - RESOLVED with IV fluid hydration.   14. Gastritis - protonix ordered for GI protection.  15. Left parotid gland lesion - Outpatient follow up recommended.  16. Essential hypertension - resuming home atenolol.  Holding home HCTZ and losartan for now.   DVT prophylaxis: enoxaparin  Code Status: full  Family Communication: t/c to sister Burney Gauze 3/7 Disposition: anticipating CIR placement   Status is: Inpatient  Remains  inpatient appropriate because:IV treatments appropriate due to intensity of illness or inability to take PO and Inpatient level of care appropriate due to severity of illness  Dispo: The patient is from: Home              Anticipated d/c is to: CIR              Patient currently is medically stable to d/c.   Difficult to place patient No  Consultants:   PT  ID  Procedures:   N/A   Antimicrobials:  Cefepime 3/3>>3/6 Vancomycin 3/3>>3/6  Subjective: Pt reports that she was dizzy when trying to stand today.  She is sitting in chair with help of PT team.  Her muscles remain very sore.      Objective: Vitals:   01/24/21 1459 01/24/21 2044 01/25/21 0410 01/25/21 0827  BP: 137/74 130/71 140/70 (!) 142/75  Pulse: 70 67 75 70  Resp: (!) 22 16 18 20   Temp: 98.7 F (37.1 C) 98.1 F (36.7 C) 98 F (36.7 C) 98.2 F (36.8 C)  TempSrc: Oral Oral    SpO2: 95% 96% 93% 94%  Weight:      Height:        Intake/Output Summary (Last 24 hours) at 01/25/2021 1049 Last data filed at 01/25/2021 0800 Gross per 24 hour  Intake 2430 ml  Output 1600 ml  Net 830 ml   Filed Weights   01/21/21 1345 01/21/21 2317 01/22/21 1100  Weight: 80.3 kg 81.9 kg 81.9 kg    Examination:  General exam: frail, elderly female, Appears calm and comfortable  Respiratory system: Clear to auscultation. Respiratory effort normal. Cardiovascular system: normal S1 & S2 heard. No JVD, murmurs, rubs, gallops or clicks. No pedal edema. Gastrointestinal system: Abdomen is nondistended, soft and nontender. No organomegaly or masses felt. Normal bowel sounds heard. Central nervous system: Alert and oriented. No focal neurological deficits. Extremities: muscles atrophied.  Skin: abrasions on knees and elbows.  Psychiatry: Judgement and insight appear normal. Mood & affect appear normal.   Data Reviewed: I have personally reviewed following labs and imaging studies  CBC: Recent Labs  Lab 01/21/21 1116  01/21/21 1731 01/21/21 2139 01/22/21 0105 01/23/21 0131 01/24/21 0049 01/25/21 0545  WBC 20.8*  --  16.6* 16.0* 10.8* 8.3 6.9  NEUTROABS 18.2*  --   --   --  8.4* 5.6 4.3  HGB 16.4*   < > 13.2 13.0 11.5* 11.2* 12.0  HCT 46.0   < > 35.8* 38.0 32.9* 31.0* 34.3*  MCV 91.3  --  91.1 93.8 95.4 92.5 92.5  PLT 188  --  152 149* 126* 134* 160   < > = values in this interval not displayed.    Basic Metabolic Panel: Recent Labs  Lab 01/21/21 1116 01/21/21 1708 01/21/21 1731 01/21/21 2139 01/22/21 0105 01/23/21 0131 01/24/21 0049 01/25/21 0545  NA 132*  --  136  --  134* 137 136 137  K 3.8 3.2* 3.1*  --  3.5 3.9 3.3* 3.6  CL 98  --   --   --  109 115* 108 100  CO2 15*  --   --   --  14* 14* 21* 29  GLUCOSE 142*  --   --   --  107* 89 128* 141*  BUN 29*  --   --   --  <5*  CREATININE 1.27*  --   --  0.96 0.86 0.66 0.56 0.51  CALCIUM 9.6  --   --   --  8.1* 7.9* 8.1* 8.1*  MG  --  1.5*  --   --  2.6* 1.9 1.6* 1.8  PHOS  --  3.1  --   --   --   --   --   --     GFR: Estimated Creatinine Clearance: 74.6 mL/min (by C-G formula based on SCr of 0.51 mg/dL).  Liver Function Tests: Recent Labs  Lab 01/21/21 1116 01/22/21 0105 01/23/21 0131 01/24/21 0049 01/25/21 0545  AST 494* 284* 223* 171* 107*  ALT 160* 114* 109* 103* 90*  ALKPHOS 58 42 40 40 42  BILITOT 1.8* 1.9* 1.5* 1.2 0.6  PROT 7.2 5.4* 4.9* 4.9* 4.9*  ALBUMIN 3.8 2.8* 2.6* 2.6* 2.5*    CBG: Recent Labs  Lab 01/24/21 1235 01/24/21 1714 01/24/21 1837 01/24/21 2106 01/25/21 0805  GLUCAP 145* 139* 144* 127* 145*    Recent Results (from the past 240 hour(s))  Blood Cultures (routine x 2)     Status: None (Preliminary result)   Collection Time: 01/21/21 11:10 AM   Specimen: BLOOD  Result Value Ref Range Status   Specimen Description BLOOD RIGHT ANTECUBITAL  Final   Special Requests   Final    BOTTLES DRAWN AEROBIC AND ANAEROBIC Blood Culture results may not be optimal due to an excessive volume of  blood received in culture bottles   Culture   Final    NO GROWTH 3 DAYS Performed at [facility], [address] [place], [place] [address]    Report Status PENDING  Incomplete  Blood Cultures (routine x 2)     Status: Abnormal (Preliminary result)   Collection Time: 01/21/21 11:16 AM   Specimen: BLOOD  Result Value Ref Range Status   Specimen Description BLOOD SITE NOT SPECIFIED  Final   Special Requests   Final    BOTTLES DRAWN AEROBIC AND ANAEROBIC Blood Culture results may not be optimal due to an inadequate volume of blood received in culture bottles   Culture  Setup Time   Final    GRAM POSITIVE COCCI IN CLUSTERS IN BOTH AEROBIC AND ANAEROBIC BOTTLES GRAM POSITIVE RODS ANAEROBIC BOTTLE ONLY Organism ID to follow CRITICAL RESULT CALLED TO, READ BACK BY AND VERIFIED WITH: PHARM D T.JACKIE AT 1914 ON 01/22/2021 BY T.SAAD    Culture (A)  Final    STAPHYLOCOCCUS HOMINIS GRAM POSITIVE RODS CULTURE REINCUBATED FOR BETTER GROWTH Performed at [facility], [address], [place], [place] [address]    Report Status PENDING  Incomplete  Blood Culture ID Panel (Reflexed)     Status: Abnormal   Collection Time: 01/21/21 11:16 AM  Result Value Ref Range Status   Enterococcus faecalis NOT DETECTED NOT DETECTED Final   Enterococcus Faecium NOT DETECTED NOT DETECTED Final   Listeria monocytogenes NOT DETECTED NOT DETECTED Final   Staphylococcus species DETECTED (A) NOT DETECTED Final    Comment: CRITICAL RESULT CALLED TO, READ BACK BY AND VERIFIED  WITH: PHARM D T.JACKIE AT 4098 ON 01/22/2021 BY T.SAAD.    Staphylococcus aureus (BCID) NOT DETECTED NOT DETECTED Final   Staphylococcus epidermidis NOT DETECTED NOT DETECTED Final   Staphylococcus lugdunensis NOT DETECTED NOT DETECTED Final   Streptococcus species NOT DETECTED NOT DETECTED Final   Streptococcus agalactiae NOT DETECTED NOT DETECTED Final   Streptococcus pneumoniae NOT DETECTED NOT DETECTED Final    Streptococcus pyogenes NOT DETECTED NOT DETECTED Final   A.calcoaceticus-baumannii NOT DETECTED NOT DETECTED Final   Bacteroides fragilis NOT DETECTED NOT DETECTED Final   Enterobacterales NOT DETECTED NOT DETECTED Final   Enterobacter cloacae complex NOT DETECTED NOT DETECTED Final   Escherichia coli NOT DETECTED NOT DETECTED Final   Klebsiella aerogenes NOT DETECTED NOT DETECTED Final   Klebsiella oxytoca NOT DETECTED NOT DETECTED Final   Klebsiella pneumoniae NOT DETECTED NOT DETECTED Final   Proteus species NOT DETECTED NOT DETECTED Final   Salmonella species NOT DETECTED NOT DETECTED Final   Serratia marcescens NOT DETECTED NOT DETECTED Final   Haemophilus influenzae NOT DETECTED NOT DETECTED Final   Neisseria meningitidis NOT DETECTED NOT DETECTED Final   Pseudomonas aeruginosa NOT DETECTED NOT DETECTED Final   Stenotrophomonas maltophilia NOT DETECTED NOT DETECTED Final   Candida albicans NOT DETECTED NOT DETECTED Final   Candida auris NOT DETECTED NOT DETECTED Final   Candida glabrata NOT DETECTED NOT DETECTED Final   Candida krusei NOT DETECTED NOT DETECTED Final   Candida parapsilosis NOT DETECTED NOT DETECTED Final   Candida tropicalis NOT DETECTED NOT DETECTED Final   Cryptococcus neoformans/gattii NOT DETECTED NOT DETECTED Final    Comment: Performed at [facility], [address], [place], [place] [address]  Urine culture     Status: Abnormal   Collection Time: 01/21/21  1:28 PM   Specimen: In/Out Cath Urine  Result Value Ref Range Status   Specimen Description IN/OUT CATH URINE  Final   Special Requests   Final    NONE Performed at [facility], [address], [place], [place] [address]    Culture >=100,000 COLONIES/mL ESCHERICHIA COLI (A)  Final   Report Status 01/24/2021 FINAL  Final   Organism ID, Bacteria ESCHERICHIA COLI (A)  Final      Susceptibility   Escherichia coli - MIC*    AMPICILLIN 8 SENSITIVE Sensitive     CEFAZOLIN <=4 SENSITIVE  Sensitive     CEFEPIME <=0.12 SENSITIVE Sensitive     CEFTRIAXONE <=0.25 SENSITIVE Sensitive     CIPROFLOXACIN <=0.25 SENSITIVE Sensitive     GENTAMICIN <=1 SENSITIVE Sensitive     IMIPENEM <=0.25 SENSITIVE Sensitive     NITROFURANTOIN <=16 SENSITIVE Sensitive     TRIMETH/SULFA <=20 SENSITIVE Sensitive     AMPICILLIN/SULBACTAM <=2 SENSITIVE Sensitive     PIP/TAZO <=4 SENSITIVE Sensitive     * >=100,000 COLONIES/mL ESCHERICHIA COLI  Resp Panel by RT-PCR (Flu A&B, Covid) Nasopharyngeal Swab     Status: None   Collection Time: 01/21/21  5:01 PM   Specimen: Nasopharyngeal Swab; Nasopharyngeal(NP) swabs in vial transport medium  Result Value Ref Range Status   SARS Coronavirus 2 by RT PCR NEGATIVE NEGATIVE Final    Comment: (NOTE) SARS-CoV-2 target nucleic acids are NOT DETECTED.  The SARS-CoV-2 RNA is generally detectable in upper respiratory specimens during the acute phase of infection. The lowest concentration of SARS-CoV-2 viral copies this assay can detect is 138 copies/mL. A negative result does not preclude SARS-Cov-2 infection and should not be used as the  sole basis for treatment or other patient management decisions. A negative result may occur with  improper specimen collection/handling, submission of specimen other than nasopharyngeal swab, presence of viral mutation(s) within the areas targeted by this assay, and inadequate number of viral copies(<138 copies/mL). A negative result must be combined with clinical observations, patient history, and epidemiological information. The expected result is Negative.  Fact Sheet for Patients:  BloggerCourse.com  Fact Sheet for Healthcare Providers:  SeriousBroker.it  This test is no t yet approved or cleared by the [place] FDA and  has been authorized for detection and/or diagnosis of SARS-CoV-2 by FDA under an Emergency Use Authorization (EUA). This EUA will remain  in  effect (meaning this test can be used) for the duration of the COVID-19 declaration under Section 564(b)(1) of the Act, 21 U.S.C.section 360bbb-3(b)(1), unless the authorization is terminated  or revoked sooner.       Influenza A by PCR NEGATIVE NEGATIVE Final   Influenza B by PCR NEGATIVE NEGATIVE Final    Comment: (NOTE) The Xpert Xpress SARS-CoV-2/FLU/RSV plus assay is intended as an aid in the diagnosis of influenza from Nasopharyngeal swab specimens and should not be used as a sole basis for treatment. Nasal washings and aspirates are unacceptable for Xpert Xpress SARS-CoV-2/FLU/RSV testing.  Fact Sheet for Patients: BloggerCourse.com  Fact Sheet for Healthcare Providers: SeriousBroker.it  This test is not yet approved or cleared by the [place] FDA and has been authorized for detection and/or diagnosis of SARS-CoV-2 by FDA under an Emergency Use Authorization (EUA). This EUA will remain in effect (meaning this test can be used) for the duration of the COVID-19 declaration under Section 564(b)(1) of the Act, 21 U.S.C. section 360bbb-3(b)(1), unless the authorization is terminated or revoked.  Performed at [facility], [address] [place], [place] [address]   Culture, blood (Routine X 2) w Reflex to ID Panel     Status: None (Preliminary result)   Collection Time: 01/23/21  1:24 PM   Specimen: BLOOD LEFT HAND  Result Value Ref Range Status   Specimen Description BLOOD LEFT HAND  Final   Special Requests   Final    BOTTLES DRAWN AEROBIC AND ANAEROBIC Blood Culture results may not be optimal due to an inadequate volume of blood received in culture bottles   Culture   Final    NO GROWTH < 24 HOURS Performed at [facility], [address] [place], [place] [address]    Report Status PENDING  Incomplete  Culture, blood (Routine X 2) w Reflex to ID Panel     Status: None (Preliminary result)    Collection Time: 01/23/21  1:24 PM   Specimen: BLOOD RIGHT HAND  Result Value Ref Range Status   Specimen Description BLOOD RIGHT HAND  Final   Special Requests   Final    BOTTLES DRAWN AEROBIC AND ANAEROBIC Blood Culture results may not be optimal due to an inadequate volume of blood received in culture bottles   Culture   Final    NO GROWTH < 24 HOURS Performed at [facility], [address] [place], [place] [address]    Report Status PENDING  Incomplete     Radiology Studies: No results found. Scheduled Meds: . acetaminophen  650 mg Oral Q6H  . atenolol  50 mg Oral Daily  . docusate sodium  100 mg Oral BID  . enoxaparin (LOVENOX) injection  40 mg Subcutaneous Q24H  . hydrocerin   Topical  BID  . insulin aspart  0-9 Units Subcutaneous TID WC  . levothyroxine  100 mcg Oral Daily  . lidocaine  1 patch Transdermal Q24H  . methocarbamol  500 mg Oral TID  . pantoprazole  40 mg Oral BID   Continuous Infusions: . sodium bicarbonate (isotonic) 150 mEq in D5W 1000 mL infusion 75 mL/hr at 01/25/21 0835    LOS: 4 days   Time spent: 35 mins   Nylee Barbuto Laural BenesJohnson, MD How to contact the [facility] Attending or Consulting provider 7A - 7P or covering provider during after hours 7P -7A, for this patient?  1. Check the care team in [facility] and look for a) attending/consulting TR[facility] provider listed and b) the [facility] team listed 2. Log into [email or web address] and use Register's universal password to access. If you do not have the password, please contact the hospital operator. 3. Locate the [facility] provider you are looking for under [facility] and page to a number that you can be directly reached. 4. If you still have difficulty reaching the provider, please page the [facility] (Director on Call) for the Hospitalists listed on amion for assistance.  01/25/2021, 10:49 AM

## 2021-01-25 NOTE — Progress Notes (Signed)
Physical Therapy Treatment Patient Details Name: Kristina Gates MRN: [id number] DOB: [birth year] Today's Date: 01/25/2021    History of Present Illness 65 y.o. female admitted on 01/21/2021 with  urosepsis/AKI after being found down at home. Work up also revealed rhabdo and multiple left rib fx in setting of presumed ground level fall.    PT Comments    Pt received in bed, agreeable to participation in therapy. She required +2 mod assist bed mobility, +2 mod assist transfers, and mod assist +2 safety amb 5' with RW. Pt with increased fear of falling, requiring encouragement and sequencing cues throughout session. VSS on RA. Pt in recliner at end of session.    Follow Up Recommendations  CIR     Equipment Recommendations  Rolling walker with 5" wheels    Recommendations for Other Services       Precautions / Restrictions Precautions Precautions: Fall;Other (comment) Precaution Comments: left rib fx    Mobility  Bed Mobility Overal bed mobility: Needs Assistance Bed Mobility: Supine to Sit     Supine to sit: Mod assist;+2 for physical assistance     General bed mobility comments: +rail, cues for sequencing, assist with BLE OOB and to elevate trunk, use of bed pad to scoot to EOB    Transfers Overall transfer level: Needs assistance Equipment used: Rolling walker (2 wheeled) Transfers: Sit to/from Stand Sit to Stand: +2 physical assistance;Mod assist         General transfer comment: cues for hand placement, assist to power up and stabilize balance  Ambulation/Gait Ambulation/Gait assistance: +2 safety/equipment;Mod assist Gait Distance (Feet): 5 Feet Assistive device: Rolling walker (2 wheeled) Gait Pattern/deviations: Step-through pattern;Decreased stride length;Shuffle Gait velocity: decreased   General Gait Details: short, shuffle steps bed to recliner with RW; cues for sequencing   Stairs             Wheelchair Mobility    Modified Rankin (Stroke  Patients Only)       Balance Overall balance assessment: Needs assistance;History of Falls Sitting-balance support: Feet supported;Single extremity supported Sitting balance-Leahy Scale: Fair     Standing balance support: Bilateral upper extremity supported;During functional activity Standing balance-Leahy Scale: Poor Standing balance comment: reliant on external support                            Cognition Arousal/Alertness: Awake/alert Behavior During Therapy: Anxious Overall Cognitive Status: Impaired/Different from baseline Area of Impairment: Memory;Following commands;Safety/judgement;Awareness;Problem solving                     Memory: Decreased short-term memory Following Commands: Follows one step commands with increased time Safety/Judgement: Decreased awareness of deficits;Decreased awareness of safety Awareness: Emergent Problem Solving: Slow processing;Difficulty sequencing;Requires verbal cues        Exercises      General Comments General comments (skin integrity, edema, etc.): increased fear of falling, encouragement needed      Pertinent Vitals/Pain Pain Assessment: Faces Faces Pain Scale: Hurts little more Pain Location: L flank Pain Descriptors / Indicators: Grimacing;Discomfort;Sore Pain Intervention(s): Monitored during session;Repositioned    Home Living                      Prior Function            PT Goals (current goals can now be found in the care plan section) Acute Rehab PT Goals Patient Stated Goal: to get better Progress towards PT  goals: Progressing toward goals    Frequency    Min 3X/week      PT Plan Current plan remains appropriate    Co-evaluation              AM-PAC PT "6 Clicks" Mobility   Outcome Measure  Help needed turning from your back to your side while in a flat bed without using bedrails?: A Little Help needed moving from lying on your back to sitting on the side of a  flat bed without using bedrails?: A Lot Help needed moving to and from a bed to a chair (including a wheelchair)?: A Lot Help needed standing up from a chair using your arms (e.g., wheelchair or bedside chair)?: A Lot Help needed to walk in hospital room?: A Lot Help needed climbing 3-5 steps with a railing? : Total 6 Click Score: 12    End of Session Equipment Utilized During Treatment: Gait belt Activity Tolerance: Patient tolerated treatment well Patient left: in chair;with call bell/phone within reach;with chair alarm set Nurse Communication: Mobility status PT Visit Diagnosis: Muscle weakness (generalized) (M62.81);Difficulty in walking, not elsewhere classified (R26.2)     Time: 8295-6213 PT Time Calculation (min) (ACUTE ONLY): 24 min  Charges:  $Gait Training: 23-37 mins                     Aida Raider, [place]  Office # [phone] Pager [phone]    Ilda Foil 01/25/2021, 10:17 AM

## 2021-01-25 NOTE — Progress Notes (Signed)
  Echocardiogram 2D Echocardiogram has been performed.  Kristina Gates F 01/25/2021, 4:15 PM

## 2021-01-26 DIAGNOSIS — E119 Type 2 diabetes mellitus without complications: Secondary | ICD-10-CM | POA: Diagnosis not present

## 2021-01-26 DIAGNOSIS — E1165 Type 2 diabetes mellitus with hyperglycemia: Secondary | ICD-10-CM | POA: Diagnosis not present

## 2021-01-26 DIAGNOSIS — I10 Essential (primary) hypertension: Secondary | ICD-10-CM | POA: Diagnosis not present

## 2021-01-26 DIAGNOSIS — I471 Supraventricular tachycardia: Secondary | ICD-10-CM | POA: Diagnosis not present

## 2021-01-26 DIAGNOSIS — E890 Postprocedural hypothyroidism: Secondary | ICD-10-CM | POA: Diagnosis not present

## 2021-01-26 LAB — GLUCOSE, CAPILLARY
Glucose-Capillary: 124 mg/dL — ABNORMAL HIGH (ref 70–99)
Glucose-Capillary: 147 mg/dL — ABNORMAL HIGH (ref 70–99)
Glucose-Capillary: 116 mg/dL — ABNORMAL HIGH (ref 70–99)
Glucose-Capillary: 115 mg/dL — ABNORMAL HIGH (ref 70–99)

## 2021-01-26 LAB — MAGNESIUM: Magnesium: 1.6 mg/dL — ABNORMAL LOW (ref 1.7–2.4)

## 2021-01-26 LAB — BASIC METABOLIC PANEL
Anion gap: 7 (ref 5–15)
BUN: <5 mg/dL — ABNORMAL LOW (ref 8–23)
CO2: 33 mmol/L — ABNORMAL HIGH (ref 22–32)
Calcium: 8.1 mg/dL — ABNORMAL LOW (ref 8.9–10.3)
Chloride: 95 mmol/L — ABNORMAL LOW (ref 98–111)
Creatinine, Ser: 0.5 mg/dL (ref 0.44–1.00)
GFR, Estimated: >60 mL/min (ref >60)
Glucose, Bld: 122 mg/dL — ABNORMAL HIGH (ref 70–99)
Potassium: 3.3 mmol/L — ABNORMAL LOW (ref 3.5–5.1)
Sodium: 135 mmol/L (ref 135–145)

## 2021-01-26 LAB — CULTURE, BLOOD (ROUTINE X 2)

## 2021-01-26 LAB — CK: Total CK: 1423 U/L — ABNORMAL HIGH (ref 38–234)

## 2021-01-26 MED ORDER — ADENOSINE 12 MG/4ML IV SOLN
12.0000 mg | Freq: Once | INTRAVENOUS | Status: DC | PRN
  Filled 2021-01-26: qty 4

## 2021-01-26 MED ORDER — ONDANSETRON HCL 4 MG/2ML IJ SOLN: 4.0000 mg | Freq: Four times a day (QID) | INTRAMUSCULAR | Status: DC | PRN

## 2021-01-26 MED ORDER — ADENOSINE 6 MG/2ML IV SOLN
6.0000 mg | INTRAVENOUS | Status: AC
  Administered 2021-01-26: 6 mg via INTRAVENOUS
  Filled 2021-01-26: qty 2

## 2021-01-26 MED ORDER — ADENOSINE 6 MG/2ML IV SOLN
6.0000 mg | Freq: Once | INTRAVENOUS | Status: AC
  Administered 2021-01-26: 6 mg via INTRAVENOUS
  Filled 2021-01-26: qty 2

## 2021-01-26 MED ORDER — ONDANSETRON HCL 4 MG/2ML IJ SOLN
INTRAMUSCULAR | Status: AC
  Filled 2021-01-26: qty 2

## 2021-01-26 MED ORDER — SODIUM CHLORIDE 0.9 % IV SOLN: INTRAVENOUS | Status: DC

## 2021-01-26 MED ORDER — SODIUM CHLORIDE 0.9 % IV BOLUS
250.0000 mL | Freq: Once | INTRAVENOUS | Status: AC
  Administered 2021-01-26: 250 mL via INTRAVENOUS

## 2021-01-26 MED ORDER — POTASSIUM CHLORIDE CRYS ER 20 MEQ PO TBCR
60.0000 meq | EXTENDED_RELEASE_TABLET | Freq: Once | ORAL | Status: AC
  Administered 2021-01-26: 60 meq via ORAL
  Filled 2021-01-26: qty 3

## 2021-01-26 MED ORDER — MAGNESIUM SULFATE 4 GM/100ML IV SOLN
4.0000 g | Freq: Once | INTRAVENOUS | Status: AC
  Administered 2021-01-26: 4 g via INTRAVENOUS
  Filled 2021-01-26: qty 100

## 2021-01-26 MED ORDER — DILTIAZEM HCL ER COATED BEADS 180 MG PO CP24
180.0000 mg | ORAL_CAPSULE | Freq: Every day | ORAL | Status: DC
  Administered 2021-01-26 – 2021-02-01 (×7): 180 mg via ORAL
  Filled 2021-01-26 (×7): qty 1
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
## 2021-01-26 NOTE — Progress Notes (Signed)
Pt requesting all 4 siderails on bed up.

## 2021-01-26 NOTE — Consult Note (Addendum)
ELECTROPHYSIOLOGY CONSULT NOTE    Patient ID: Kristina Gates MRN: [id number], DOB/AGE: 07-24-1956 65 y.o.  Admit date: 01/21/2021 Date of Consult: 01/26/2021  Primary Physician: Patient, No Pcp Per Primary Cardiologist: No primary care provider on file.  Electrophysiologist: New  Referring Provider: Dr. Laural Benes  Patient Profile: Kristina Gates is a 65 y.o. female with a history of HTN, DM2, and HLD who is being seen today for the evaluation of tachycardia at the request of Dr. Laural Benes.  HPI:  Kristina Gates is a 65 y.o. female with medical history as above. She was her USOH up until last week where she had several days of feeling weak, and then a poor memory over several days. She remembers lying on the floor intermittently. She remembers someone knocking on the door at some point, but felt too tired to answer the door.   After 2 days of not hearing from her, her sister called police for a welfare check. She was found down covered in urine with complaints of feeling weak and tired but otherwise very little memory of the preceding days.   In ED pt noted to have Tmax 99.3 Extensive work up was notable for +UTI, lactic acidosis, significant rhabdomyolysis with CPK > 26,000, and AKI. Head CT WNL. CXR with 4 consecytive rib fractures posteriorly without pneumothorax.  She was treated for sepsis with broad spectrum antibiotics including vanc, cefepime, and flagyl. She was noted to have a period of SVT 190-210 range that "was aborted promptly" with adenosine.   Her mental status has continued to improve with hydration and treatment of her infection. This am, she had recurrent SVT, again aborted by Adenosine (x 2 doses) and EP was called for consultation.   She is currently feeling fine and eating on my arrival. She felt tired and felt her heart racing during the episode this am.  She has felt tachypalpitations 2-3 times a year since she was a "young girl". She thinks prior to this admission, had  been upwards of a year. She denies any history of known syncope or near syncope (It is still unclear how she ended up in the floor prior to this admission.)  Echo 01/25/21 LVEF 60-65%, Grade 1 DD, normal RV, Normal LA and RA size.   Past Medical History:  Diagnosis Date  . Diabetes mellitus without complication (HCC)   . Hypercholesteremia   . Hypertension   . Postprocedural hypothyroidism      Surgical History: History reviewed. No pertinent surgical history.   Medications Prior to Admission  Medication Sig Dispense Refill Last Dose  . acetaminophen (TYLENOL) 325 MG tablet Take 650 mg by mouth every 6 (six) hours as needed for moderate pain.   01/20/2021 at Unknown time  . atenolol (TENORMIN) 50 MG tablet Take 50 mg by mouth daily.   01/18/2021  . atorvastatin (LIPITOR) 40 MG tablet Take 40 mg by mouth daily.   01/18/2021  . hydrochlorothiazide (HYDRODIURIL) 25 MG tablet Take 25 mg by mouth daily.   01/20/2021 at Unknown time  . losartan (COZAAR) 100 MG tablet Take 100 mg by mouth daily.   01/19/2021  . Multiple Vitamin (MULTIVITAMIN WITH MINERALS) TABS tablet Take 1 tablet by mouth daily.   01/19/2021  . NOVOLOG MIX 70/30 FLEXPEN (70-30) 100 UNIT/ML FlexPen Inject 12 Units into the skin every evening.   01/20/2021 at Unknown time  . sodium chloride (OCEAN) 0.65 % SOLN nasal spray Place 1 spray into both nostrils daily as needed for congestion.  12/31/2020  . SYNTHROID 100 MCG tablet Take 100 mcg by mouth daily.   01/19/2021    Inpatient Medications:  . acetaminophen  650 mg Oral Q6H  . diltiazem  180 mg Oral Daily  . docusate sodium  100 mg Oral BID  . enoxaparin (LOVENOX) injection  40 mg Subcutaneous Q24H  . hydrocerin   Topical BID  . insulin aspart  0-9 Units Subcutaneous TID WC  . levothyroxine  100 mcg Oral Daily  . lidocaine  1 patch Transdermal Q24H  . methocarbamol  500 mg Oral TID  . ondansetron      . pantoprazole  40 mg Oral BID    Allergies:  Allergies  Allergen Reactions   . Metformin And Related Cough  . Zestril [Lisinopril] Cough    Social History   Socioeconomic History  . Marital status: Married    Spouse name: Not on file  . Number of children: Not on file  . Years of education: Not on file  . Highest education level: Not on file  Occupational History  . Not on file  Tobacco Use  . Smoking status: Never Smoker  . Smokeless tobacco: Never Used  Substance and Sexual Activity  . Alcohol use: Not on file  . Drug use: Not on file  . Sexual activity: Not on file  Other Topics Concern  . Not on file  Social History Narrative  . Not on file   Social Determinants of Health   Financial Resource Strain: Not on file  Food Insecurity: Not on file  Transportation Needs: Not on file  Physical Activity: Not on file  Stress: Not on file  Social Connections: Not on file  Intimate Partner Violence: Not on file     History reviewed. No pertinent family history.   Review of Systems: All other systems reviewed and are otherwise negative except as noted above.  Physical Exam: Vitals:   01/26/21 0845 01/26/21 0859 01/26/21 0916 01/26/21 0947  BP: (!) 80/66 123/67 130/69 131/70  Pulse:    78  Resp: Temp:    98.5 F (36.9 C)  TempSrc:    Oral  SpO2: 97% 95% 94% 95%  Weight:      Height:        GEN- The patient is well appearing, alert and oriented x 3 today.   HEENT: normocephalic, atraumatic; sclera clear, conjunctiva pink; hearing intact; oropharynx clear; neck supple Lungs- Clear to ausculation bilaterally, normal work of breathing.  No wheezes, rales, rhonchi Heart- Regular rate and rhythm, no murmurs, rubs or gallops GI- soft, non-tender, non-distended, bowel sounds present Extremities- no clubbing, cyanosis, or edema; DP/PT/radial pulses 2+ bilaterally MS- no significant deformity or atrophy Skin- warm and dry, no rash or lesion Psych- euthymic mood, full affect Neuro- strength and sensation are intact  Labs:   Lab  Results  Component Value Date   WBC 6.9 01/25/2021   HGB 12.0 01/25/2021   HCT 34.3 (L) 01/25/2021   MCV 92.5 01/25/2021   PLT 160 01/25/2021    Recent Labs  Lab 01/25/21 0545 01/26/21 0101  NA 137 135  K 3.6 3.3*  CL 100 95*  CO2 29 33*  BUN <5* <5*  CREATININE 0.51 0.50  CALCIUM 8.1* 8.1*  PROT 4.9*  --   BILITOT 0.6  --   ALKPHOS 42  --   ALT 90*  --   AST 107*  --   GLUCOSE 141* 122*  Radiology/Studies: CT HEAD WO CONTRAST  Result Date: 01/21/2021 CLINICAL DATA:  Recent fall with headaches and facial pain, initial encounter EXAM: CT HEAD WITHOUT CONTRAST CT CERVICAL SPINE WITHOUT CONTRAST TECHNIQUE: Multidetector CT imaging of the head and cervical spine was performed following the standard protocol without intravenous contrast. Multiplanar CT image reconstructions of the cervical spine were also generated. COMPARISON:  None. FINDINGS: CT HEAD FINDINGS Brain: No evidence of acute infarction, hemorrhage, hydrocephalus, extra-axial collection or mass lesion/mass effect. Vascular: No hyperdense vessel or unexpected calcification. Skull: Normal. Negative for fracture or focal lesion. Sinuses/Orbits: No acute finding. Other: Mild scalp hematoma is noted in the left posterior parietal region related to the recent injury. CT CERVICAL SPINE FINDINGS Alignment: Mild straightening of the normal cervical lordosis is noted likely related to muscular spasm. Skull base and vertebrae: 7 cervical segments are well visualized. Vertebral body height is well maintained. Mild osteophytic changes are noted primarily at C6-7 anteriorly. No acute fracture or acute facet abnormality is noted. The odontoid is within normal limits. Soft tissues and spinal canal: There is a 16 by 25 mm mildly hyperdense nodule identified in the posterior aspect of the left parotid gland. This was present on prior CT of the neck from 08/25/2020 and is somewhat smaller in size when compared with the prior exam. Upper chest:  Visualized lung apices are within normal limits. Other: None IMPRESSION: CT of the head: Mild scalp hematoma in the left posterior parietal region. No acute intracranial abnormality noted. CT of the cervical spine: Mild degenerative change of the cervical spine without acute bony abnormality. Left parotid lesion somewhat smaller and less heterogeneous when compared with the prior exam as described. ENT referral is again recommended if this has not been performed. Electronically Signed   By: Alcide Clever M.D.   On: 01/21/2021 14:43   CT CHEST W CONTRAST  Result Date: 01/21/2021 CLINICAL DATA:  Larey Seat. Left rib fractures on a portable chest earlier today. Abdominal trauma. EXAM: CT CHEST, ABDOMEN, AND PELVIS WITH CONTRAST TECHNIQUE: Multidetector CT imaging of the chest, abdomen and pelvis was performed following the standard protocol during bolus administration of intravenous contrast. CONTRAST:  OMNIPAQUE IOHEXOL 300 MG/ML  SOLN COMPARISON:  Portable chest obtained earlier today. FINDINGS: CT CHEST FINDINGS Cardiovascular: Atheromatous calcifications, including the coronary arteries and aorta. Normal sized heart. Minimal pericardial effusion with a maximum thickness of 7 mm. Mediastinum/Nodes: Very small thyroid gland or thyroid remnant. No enlarged lymph nodes. Unremarkable esophagus. Lungs/Pleura: Minimal bilateral dependent atelectasis. Otherwise, clear lungs. No pneumothorax or pleural fluid. Musculoskeletal: Left posterolateral acute 4th through 8th rib fractures. Varying degrees of displacement. No spinal fractures or subluxations. The sternum is intact. Thoracic and lower cervical spine degenerative changes. Diffuse osteopenia. CT ABDOMEN PELVIS FINDINGS Hepatobiliary: No focal liver abnormality is seen. No gallstones, gallbladder wall thickening, or biliary dilatation. Pancreas: Unremarkable. No pancreatic ductal dilatation or surrounding inflammatory changes. Spleen: Normal in size without focal  abnormality. Adrenals/Urinary Tract: Normal appearing adrenal glands. Small bilateral renal cysts. Unremarkable ureters and urinary bladder. Stomach/Bowel: Moderate diffuse low density wall thickening involving the gastric antrum, pylorus, duodenal bulb and 2nd portion of the duodenum. Minimal adjacent soft tissue stranding. Moderate to marked luminal narrowing. Unremarkable colon and appendix. Vascular/Lymphatic: Atheromatous arterial calcifications without aneurysm. No enlarged lymph nodes. Reproductive: Status post hysterectomy. No adnexal masses. Other: Small umbilical hernia containing fat. Multiple small supraumbilical and infraumbilical ventral hernias containing fat. Musculoskeletal: Lumbar spine degenerative changes. These include facet degenerative changes with associated grade 1  anterolisthesis at the L4-5 level. No fractures, subluxations or dislocations. IMPRESSION: 1. Left posterolateral acute 4th through 8th rib fractures without pneumothorax. 2. Moderate diffuse low density wall thickening involving the gastric antrum, pylorus, duodenal bulb and 2nd portion of the duodenum with associated moderate to marked luminal narrowing. This is most likely due to gastritis and duodenitis. 3. Minimal pericardial effusion. 4. Multiple small supraumbilical and infraumbilical ventral hernias containing fat. 5. Calcific coronary artery and aortic atherosclerosis. Aortic Atherosclerosis (ICD10-I70.0). Electronically Signed   By: Beckie Salts M.D.   On: 01/21/2021 16:43   CT CERVICAL SPINE WO CONTRAST  Result Date: 01/21/2021 CLINICAL DATA:  Recent fall with headaches and facial pain, initial encounter EXAM: CT HEAD WITHOUT CONTRAST CT CERVICAL SPINE WITHOUT CONTRAST TECHNIQUE: Multidetector CT imaging of the head and cervical spine was performed following the standard protocol without intravenous contrast. Multiplanar CT image reconstructions of the cervical spine were also generated. COMPARISON:  None. FINDINGS:  CT HEAD FINDINGS Brain: No evidence of acute infarction, hemorrhage, hydrocephalus, extra-axial collection or mass lesion/mass effect. Vascular: No hyperdense vessel or unexpected calcification. Skull: Normal. Negative for fracture or focal lesion. Sinuses/Orbits: No acute finding. Other: Mild scalp hematoma is noted in the left posterior parietal region related to the recent injury. CT CERVICAL SPINE FINDINGS Alignment: Mild straightening of the normal cervical lordosis is noted likely related to muscular spasm. Skull base and vertebrae: 7 cervical segments are well visualized. Vertebral body height is well maintained. Mild osteophytic changes are noted primarily at C6-7 anteriorly. No acute fracture or acute facet abnormality is noted. The odontoid is within normal limits. Soft tissues and spinal canal: There is a 16 by 25 mm mildly hyperdense nodule identified in the posterior aspect of the left parotid gland. This was present on prior CT of the neck from 08/25/2020 and is somewhat smaller in size when compared with the prior exam. Upper chest: Visualized lung apices are within normal limits. Other: None IMPRESSION: CT of the head: Mild scalp hematoma in the left posterior parietal region. No acute intracranial abnormality noted. CT of the cervical spine: Mild degenerative change of the cervical spine without acute bony abnormality. Left parotid lesion somewhat smaller and less heterogeneous when compared with the prior exam as described. ENT referral is again recommended if this has not been performed. Electronically Signed   By: Alcide Clever M.D.   On: 01/21/2021 14:43   CT ABDOMEN PELVIS W CONTRAST  Result Date: 01/21/2021 CLINICAL DATA:  Larey Seat. Left rib fractures on a portable chest earlier today. Abdominal trauma. EXAM: CT CHEST, ABDOMEN, AND PELVIS WITH CONTRAST TECHNIQUE: Multidetector CT imaging of the chest, abdomen and pelvis was performed following the standard protocol during bolus administration of  intravenous contrast. CONTRAST:  OMNIPAQUE IOHEXOL 300 MG/ML  SOLN COMPARISON:  Portable chest obtained earlier today. FINDINGS: CT CHEST FINDINGS Cardiovascular: Atheromatous calcifications, including the coronary arteries and aorta. Normal sized heart. Minimal pericardial effusion with a maximum thickness of 7 mm. Mediastinum/Nodes: Very small thyroid gland or thyroid remnant. No enlarged lymph nodes. Unremarkable esophagus. Lungs/Pleura: Minimal bilateral dependent atelectasis. Otherwise, clear lungs. No pneumothorax or pleural fluid. Musculoskeletal: Left posterolateral acute 4th through 8th rib fractures. Varying degrees of displacement. No spinal fractures or subluxations. The sternum is intact. Thoracic and lower cervical spine degenerative changes. Diffuse osteopenia. CT ABDOMEN PELVIS FINDINGS Hepatobiliary: No focal liver abnormality is seen. No gallstones, gallbladder wall thickening, or biliary dilatation. Pancreas: Unremarkable. No pancreatic ductal dilatation or surrounding inflammatory changes. Spleen: Normal in  size without focal abnormality. Adrenals/Urinary Tract: Normal appearing adrenal glands. Small bilateral renal cysts. Unremarkable ureters and urinary bladder. Stomach/Bowel: Moderate diffuse low density wall thickening involving the gastric antrum, pylorus, duodenal bulb and 2nd portion of the duodenum. Minimal adjacent soft tissue stranding. Moderate to marked luminal narrowing. Unremarkable colon and appendix. Vascular/Lymphatic: Atheromatous arterial calcifications without aneurysm. No enlarged lymph nodes. Reproductive: Status post hysterectomy. No adnexal masses. Other: Small umbilical hernia containing fat. Multiple small supraumbilical and infraumbilical ventral hernias containing fat. Musculoskeletal: Lumbar spine degenerative changes. These include facet degenerative changes with associated grade 1 anterolisthesis at the L4-5 level. No fractures, subluxations or dislocations.  IMPRESSION: 1. Left posterolateral acute 4th through 8th rib fractures without pneumothorax. 2. Moderate diffuse low density wall thickening involving the gastric antrum, pylorus, duodenal bulb and 2nd portion of the duodenum with associated moderate to marked luminal narrowing. This is most likely due to gastritis and duodenitis. 3. Minimal pericardial effusion. 4. Multiple small supraumbilical and infraumbilical ventral hernias containing fat. 5. Calcific coronary artery and aortic atherosclerosis. Aortic Atherosclerosis (ICD10-I70.0). Electronically Signed   By: Beckie SaltsSteven  Reid M.D.   On: 01/21/2021 16:43   DG Chest Port 1 View  Result Date: 01/21/2021 CLINICAL DATA:  Altered mental status following a fall today. EXAM: PORTABLE CHEST 1 VIEW COMPARISON:  None. FINDINGS: Normal sized heart. Mildly tortuous and minimally calcified thoracic aorta. Minimal linear atelectasis or scarring in the left lower lung zone. Otherwise clear lungs with normal vascularity. Mildly displaced left 4th posterior rib fracture. Minimally displaced left 6th and 7th posterior rib fractures. No pneumothorax. IMPRESSION: Left 4th, 6th and 7th posterior rib fractures without pneumothorax. Electronically Signed   By: Beckie SaltsSteven  Reid M.D.   On: 01/21/2021 11:45   ECHOCARDIOGRAM COMPLETE  Result Date: 01/25/2021    ECHOCARDIOGRAM REPORT   Patient Name:   Kristina Gates Date of Exam: 01/25/2021 Medical Rec #:  [id number]       Height:       66.0 in Accession #:    [id number]      Weight:       180.6 lb Date of Birth:  11/04/1956        BSA:          1.915 m Patient Age:    64 years        BP:           139/74 mmHg Patient Gender: F               HR:           59 bpm. Exam Location:  Inpatient Procedure: 2D Echo, Cardiac Doppler and Color Doppler Indications:    R55 Syncope  History:        Patient has no prior history of Echocardiogram examinations.  Sonographer:    Roosvelt Maserachel Lane RDCS Referring Phys: [phone] [facility] L J[facility]NSON IMPRESSIONS  1. Left  ventricular ejection fraction, by estimation, is 60 to 65%. The left ventricle has normal function. The left ventricle has no regional wall motion abnormalities. There is mild concentric left ventricular hypertrophy. Left ventricular diastolic parameters are consistent with Grade I diastolic dysfunction (impaired relaxation).  2. Right ventricular systolic function is normal. The right ventricular size is normal.  3. The mitral valve is normal in structure. Trivial mitral valve regurgitation.  4. The aortic valve is tricuspid. There is mild thickening of the aortic valve. Aortic valve regurgitation is not visualized. Mild aortic valve sclerosis is present, with no evidence of aortic  valve stenosis.  5. The inferior vena cava is normal in size with <50% respiratory variability, suggesting right atrial pressure of 8 mmHg. Comparison(s): No prior Echocardiogram. FINDINGS  Left Ventricle: Left ventricular ejection fraction, by estimation, is 60 to 65%. The left ventricle has normal function. The left ventricle has no regional wall motion abnormalities. The left ventricular internal cavity size was normal in size. There is  mild concentric left ventricular hypertrophy. Left ventricular diastolic parameters are consistent with Grade I diastolic dysfunction (impaired relaxation). Right Ventricle: The right ventricular size is normal. No increase in right ventricular wall thickness. Right ventricular systolic function is normal. Left Atrium: Left atrial size was normal in size. Right Atrium: Right atrial size was normal in size. Pericardium: There is no evidence of pericardial effusion. Mitral Valve: The mitral valve is normal in structure. Mild mitral annular calcification. Trivial mitral valve regurgitation. Tricuspid Valve: The tricuspid valve is normal in structure. Tricuspid valve regurgitation is trivial. Aortic Valve: The aortic valve is tricuspid. There is mild thickening of the aortic valve. Aortic valve  regurgitation is not visualized. Mild aortic valve sclerosis is present, with no evidence of aortic valve stenosis. Aortic valve mean gradient measures 4.0 mmHg. Aortic valve peak gradient measures 7.5 mmHg. Pulmonic Valve: The pulmonic valve was normal in structure. Pulmonic valve regurgitation is trivial. Aorta: The aortic root and ascending aorta are structurally normal, with no evidence of dilitation. Venous: The inferior vena cava is normal in size with less than 50% respiratory variability, suggesting right atrial pressure of 8 mmHg. IAS/Shunts: No atrial level shunt detected by color flow Doppler.  LEFT VENTRICLE PLAX 2D LVIDd:         4.50 cm Diastology LVIDs:         3.00 cm LV e' medial:    9.36 cm/s LV PW:         0.90 cm LV E/e' medial:  7.6 LV IVS:        0.90 cm LV e' lateral:   6.20 cm/s                        LV E/e' lateral: 11.4  RIGHT VENTRICLE          IVC RV Basal diam:  3.60 cm  IVC diam: 1.50 cm LEFT ATRIUM             Index       RIGHT ATRIUM           Index LA diam:        3.40 cm 1.78 cm/m  RA Area:     25.65 cm LA Vol (A2C):   72.8 ml 38.02 ml/m RA Volume:   82.40 ml  43.03 ml/m LA Vol (A4C):   77.9 ml 40.68 ml/m LA Biplane Vol: 76.7 ml 40.06 ml/m  AORTIC VALVE AV Vmax:           137.00 cm/s AV Vmean:          93.400 cm/s AV VTI:            0.269 m AV Peak Grad:      7.5 mmHg AV Mean Grad:      4.0 mmHg LVOT Vmax:         113.00 cm/s LVOT Vmean:        67.300 cm/s LVOT VTI:          0.224 m LVOT/AV VTI ratio: 0.83  AORTA Ao Root diam: 2.80 cm Ao  Asc diam:  3.10 cm MITRAL VALVE               TRICUSPID VALVE MV Area (PHT): 3.77 cm    TR Peak grad:   37.0 mmHg MV Decel Time: 201 msec    TR Vmax:        304.00 cm/s MV E velocity: 70.70 cm/s MV A velocity: 79.30 cm/s  SHUNTS MV E/A ratio:  0.89        Systemic VTI: 0.22 m Laurance Flatten MD Electronically signed by Laurance Flatten MD Signature Date/Time: 01/25/2021/4:48:27 PM    Final     EKG: on arrival showed sinus tach at 101  bpm Subsequent EKG shows SVT at 193 bpm with pseudo R' following QRS(personally reviewed)  TELEMETRY: NSR 70-90s primarily. Episode of SVT this am noted. Appears to have been aborted with second dose of adenosine.  (personally reviewed)  Assessment/Plan: 1.  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 Transition atenolol to diltiazem 180 mg daily. Can use short acting prn as well if needed.  EF normal Likely in setting of acute illness. If not controlled on medications and outside of the setting of infection, she may be candidate for ablation in the future.   2. Sepsis In setting of E. Coli UTI "Staph bacteremia" thought to be a contaminant.   3. Mechanical fall with prolonged downtime and rhabdomyolysis Metabolic Acidosis has resolved. CK continues to trend down with hydration.   Likely exacerbation of a underlying condition given her long history of intermittent tachypalpitations, this in the setting of acute illness.  As above will change atenolol to diltiazem and follow. Dr. Johney Frame to see.   For questions or updates, please contact [facility] Please consult [email or web address] for contact info under Cardiology/STEMI.  Signed, Graciella Freer, PA-C  01/26/2021 10:35 AM   I have seen, examined the patient, and reviewed the above assessment and plan.  Changes to above are made where necessary.  On exam, RRR.  Short episodes of SVT are noted.  Will try diltiazem as above. Could consider elective ablation discussions as an outpatient.  Electrophysiology team to see as needed while here. Please call with questions.   Co Sign: Hillis Range, MD 01/26/2021 4:24 PM

## 2021-01-26 NOTE — Significant Event (Signed)
Rapid Response Event Note   Reason for Call :  SVT, HR 190 bpm  Initial Focused Assessment:  Pt lying in bed, alert. Lethargic. Hot, diaphoretic. Pale. No respiratory distress. She denies pain. Endorses feeling fatigued and like she was going to "pass out".   VS: T 97.40F oral, BP 80/66, HR 183, RR 16, SpO2 97% on room air  Interventions:  Adenosine 6mg  IV x2  0846 & 0856 - MD at bedside during administration Zoll monitoring in place- pt on continuous telemetry, full set of VS obtained  Plan of Care:  -Transfer to PCU for closer cardiac monitoring -250 cc bolus   Call rapid response for additional needs  Event Summary:  MD Notified: Dr. Call Time: Laural Benes Arrival Time: 1017 End Time: 0900  [id number], RN

## 2021-01-26 NOTE — Progress Notes (Signed)
Inpatient Rehabilitation Admissions Coordinator   Inpatient rehab consult received. I met at bedside to begin discussions of goals and expectations of a possible CIR admit. Noted cardiology workup underway . Not yet at a level to pursue inpt rehab admit. I will follow.  Danne Baxter, RN, MSN Rehab Admissions Coordinator [phone] 01/26/2021 2:20 PM

## 2021-01-26 NOTE — Plan of Care (Signed)
  Problem: Education: Goal: Knowledge of General Education information will improve Description: Including pain rating scale, medication(s)/side effects and non-pharmacologic comfort measures Outcome: Progressing   Problem: Health Behavior/Discharge Planning: Goal: Ability to manage health-related needs will improve Outcome: Progressing   Problem: Clinical Measurements: Goal: Ability to maintain clinical measurements within normal limits will improve Outcome: Progressing Goal: Will remain free from infection Outcome: Progressing Goal: Diagnostic test results will improve Outcome: Progressing Goal: Respiratory complications will improve Outcome: Progressing Goal: Cardiovascular complication will be avoided Outcome: Progressing   Problem: Activity: Goal: Risk for activity intolerance will decrease Outcome: Progressing   Problem: Nutrition: Goal: Adequate nutrition will be maintained Outcome: Progressing   Problem: Coping: Goal: Level of anxiety will decrease Outcome: Progressing   Problem: Elimination: Goal: Will not experience complications related to bowel motility Outcome: Progressing Goal: Will not experience complications related to urinary retention Outcome: Progressing   Problem: Pain Managment: Goal: General experience of comfort will improve Outcome: Progressing   Problem: Safety: Goal: Ability to remain free from injury will improve Outcome: Progressing   Problem: Skin Integrity: Goal: Risk for impaired skin integrity will decrease Outcome: Progressing   Problem: Education: Goal: Ability to manage disease process will improve Outcome: Progressing Goal: Individualized Educational Video(s) Outcome: Progressing   Problem: Cardiac: Goal: Ability to achieve and maintain adequate cardiopulmonary perfusion will improve Outcome: Progressing   

## 2021-01-26 NOTE — Progress Notes (Signed)
PROGRESS NOTE  Kristina Gates  RUE:[id number] DOB: [birth year] DOA: 01/21/2021 PCP: Patient, No Pcp Per   Chief Complaint  Patient presents with  . Altered Mental Status  . Fall   Level of care: Progressive  Brief Admission History:  65 y.o. female with PMH significant for HTN, DM 2, HL who was in her usual state of excellent health until about 4 to 5 days ago when she felt weak was not sure why she felt weak.  She states that she apparently had difficulty walking around and tried to rest.  She remembers at one point someone was knocking at the door but she was too tired to get it so did not.  She apparently fell but does not remember falling.  She does remember lying on the floor intermittently but has very little memory of the events.  She states that she texts her sister every morning and apparently her sister had not heard from her in 2 days so asked the police for a welfare check.  The police found her down on the floor covered in urine.  Patient is unable to provide a review of systems that she does not really remember much of what has been going on.  She does remember feeling very weak and tired.  He does not think she has had any fevers or chills but she is not sure.  Does not think she has had a cough or shortness of breath but is not sure.  ED Course:  The patient was noted to be acutely confused, normotensive with unlabored tachypnea.  T-max was 99.3.  She had a GCS of 14 when she came in.  Patient underwent extensive work-up which was notable for UTI, lactic acidosis, significant rhabdomyolysis with CPK of 26K, acute kidney injury.  Head CT was negative for any intracranial bleed or abnormality.  X-ray does reveal 4 consecutive rib fractures posteriorly without pneumothorax.  She was treated for sepsis with aggressive fluid resuscitation and initiation of broad-spectrum antibiotics with vancomycin and cefepime and Flagyl.  Her mental status improved steadily with treatment of infection  and with hydration.  However while she was being treated patient had acute episode of SVT with heart rate of 206.  This was aborted promptly after receiving 6 mg of adenosine.  Assessment & Plan:   Active Problems:   Essential hypertension   Hyperglycemia due to type 2 diabetes mellitus (HCC)   Postoperative hypothyroidism   Pure hypercholesterolemia   Sepsis (HCC)   Traumatic rhabdomyolysis (HCC)   Diabetes mellitus without complication (HCC)   SVT (supraventricular tachycardia) (HCC)  1. Recurrent SVT - called this AM with HR 190s, having palpitations, SVT on monitor and EKG, ordered for adenosine 6 mg.  BP was 80/60.  Bolus NS 250 cc ordered.  Rapid response called as I was told adenosine could not be given on 6N.  I insisted the medicine be given immediately.  I ordered patient to be placed on crash cart heart monitor.  When medication arrived from pharmacy we gave 2 doses of adenosine 6 mg and after 2nd dose patient converted to sinus rhythm.  Cardiology was consulted. Order to transfer patient to progressive care bed.  Atenolol morning dose had not been given yet until we can confirm BPs will hold.  After conversion to sinus rhythm, patient felt much better, BP improved to 115/70.   2. Sepsis - resolved.  ID consult appreciated.  Off all antibiotics now.  3. Nontraumatic rhabdomyolysis - CK trending down with  aggressive hydration, continue to follow CK level for now.  Continue PT.  4. Metabolic acidosis - improved on sodium bicarbonate solution.   5. E coli UTI - Treated. Off antibiotics.  6. Staphylococcus Bacteremia - likely contaminant, agree with ID team.  Repeated blood cultures 01/23/21 NGTD.  Off antibiotics.  7. Rib fracture -continue conservative treatment.  PT eval recommending CIR.   8. SVT - First episode was treated with adenosine in ED with resolution.  She had been resumed on home atenolol and there was no recurrence until AM 01/26/21.   2D Echo done 3/7 EF 60-65% grade 1 DD, no  RWMA .  9. Metabolic acidosis - RESOLVED.   10. Hypokalemia/Hypomagnesemia - Repleted. Oral and IV replacement was given.  11. Elevated LFTs - likely related to rhabdomyolysis.  Acute hepatitis panel negative.  LFTs trending down.  Follow. Continue supportive measures.   12. Leukocytosis - WBC normalized.  13. Elevated troponin - suspect elevated in setting of rhabdomyolysis.  Do not suspect ACS.  14. AKI - RESOLVED with IV fluid hydration.   15. Gastritis - protonix ordered for GI protection.  16. Left parotid gland lesion - Outpatient follow up recommended.  17. Essential hypertension - resuming home atenolol.  Holding home HCTZ and losartan for now.   DVT prophylaxis: enoxaparin  Code Status: full  Family Communication: t/c to sister Burney Gauze 3/7, 3/8 Disposition: anticipating CIR placement   Status is: Inpatient  Remains inpatient appropriate because:IV treatments appropriate due to intensity of illness or inability to take PO and Inpatient level of care appropriate due to severity of illness  Dispo: The patient is from: Home              Anticipated d/c is to: CIR              Patient currently is medically stable to d/c.   Difficult to place patient No  Consultants:   PT  ID  Procedures:   N/A   Antimicrobials:  Cefepime 3/3>>3/6 Vancomycin 3/3>>3/6  Subjective: Pt started having dizziness and palpitations, went into rapid SVT and hypotension on monitor, also having nausea but no emesis, no chest pain, mild SOB when she was in SVT.      Objective: Vitals:   01/25/21 2054 01/26/21 0538 01/26/21 0845 01/26/21 0859  BP: 138/72 (!) 156/75 (!) 80/66 123/67  Pulse: 67 66    Resp: Temp: 98 F (36.7 C) 98.4 F (36.9 C)    TempSrc: Oral Oral    SpO2: 97% 92% 97% 95%  Weight:      Height:        Intake/Output Summary (Last 24 hours) at 01/26/2021 0910 Last data filed at 01/25/2021 1754 Gross per 24 hour  Intake 420 ml  Output --  Net 420 ml   Filed  Weights   01/21/21 1345 01/21/21 2317 01/22/21 1100  Weight: 80.3 kg 81.9 kg 81.9 kg    Examination:  General exam: Pt appears pale, pt diaphoretic, frail, elderly female, Appears calm and comfortable  Respiratory system: mild increased work of breathing.  Respiratory effort normal. Cardiovascular system: tachycardic rate, then converted later to regular rhythm. No JVD, murmurs, rubs, gallops or clicks. No pedal edema. Gastrointestinal system: Abdomen is nondistended, soft and nontender. No organomegaly or masses felt. Normal bowel sounds heard. Central nervous system: Alert and oriented. No focal neurological deficits. Extremities: muscles atrophied.  Skin: abrasions on knees and elbows.  Psychiatry: Judgement and insight appear normal.  Mood & affect appear normal.   Data Reviewed: I have personally reviewed following labs and imaging studies  CBC: Recent Labs  Lab 01/21/21 1116 01/21/21 1731 01/21/21 2139 01/22/21 0105 01/23/21 0131 01/24/21 0049 01/25/21 0545  WBC 20.8*  --  16.6* 16.0* 10.8* 8.3 6.9  NEUTROABS 18.2*  --   --   --  8.4* 5.6 4.3  HGB 16.4*   < > 13.2 13.0 11.5* 11.2* 12.0  HCT 46.0   < > 35.8* 38.0 32.9* 31.0* 34.3*  MCV 91.3  --  91.1 93.8 95.4 92.5 92.5  PLT 188  --  152 149* 126* 134* 160   < > = values in this interval not displayed.    Basic Metabolic Panel: Recent Labs  Lab 01/21/21 1116 01/21/21 1708 01/21/21 1731 01/21/21 2139 01/22/21 0105 01/23/21 0131 01/24/21 0049 01/25/21 0545  NA 132*  --  136  --  134* 137 136 137  K 3.8 3.2* 3.1*  --  3.5 3.9 3.3* 3.6  CL 98  --   --   --  109 115* 108 100  CO2 15*  --   --   --  14* 14* 21* 29  GLUCOSE 142*  --   --   --  107* 89 128* 141*  BUN 29*  --   --   --  21 12 8  <5*  CREATININE 1.27*  --   --  0.96 0.86 0.66 0.56 0.51  CALCIUM 9.6  --   --   --  8.1* 7.9* 8.1* 8.1*  MG  --  1.5*  --   --  2.6* 1.9 1.6* 1.8  PHOS  --  3.1  --   --   --   --   --   --     GFR: Estimated Creatinine  Clearance: 74.6 mL/min (by C-G formula based on SCr of 0.51 mg/dL).  Liver Function Tests: Recent Labs  Lab 01/21/21 1116 01/22/21 0105 01/23/21 0131 01/24/21 0049 01/25/21 0545  AST 494* 284* 223* 171* 107*  ALT 160* 114* 109* 103* 90*  ALKPHOS 58 42 40 40 42  BILITOT 1.8* 1.9* 1.5* 1.2 0.6  PROT 7.2 5.4* 4.9* 4.9* 4.9*  ALBUMIN 3.8 2.8* 2.6* 2.6* 2.5*    CBG: Recent Labs  Lab 01/25/21 0805 01/25/21 1155 01/25/21 1738 01/25/21 2042 01/26/21 0734  GLUCAP 145* 135* 123* 124* 124*    Recent Results (from the past 240 hour(s))  Blood Cultures (routine x 2)     Status: None (Preliminary result)   Collection Time: 01/21/21 11:10 AM   Specimen: BLOOD  Result Value Ref Range Status   Specimen Description BLOOD RIGHT ANTECUBITAL  Final   Special Requests   Final    BOTTLES DRAWN AEROBIC AND ANAEROBIC Blood Culture results may not be optimal due to an excessive volume of blood received in culture bottles   Culture   Final    NO GROWTH 4 DAYS Performed at [facility], [address] [place], [place] [id number]    Report Status PENDING  Incomplete  Blood Cultures (routine x 2)     Status: Abnormal (Preliminary result)   Collection Time: 01/21/21 11:16 AM   Specimen: BLOOD  Result Value Ref Range Status   Specimen Description BLOOD SITE NOT SPECIFIED  Final   Special Requests   Final    BOTTLES DRAWN AEROBIC AND ANAEROBIC Blood Culture results may not be optimal due to an inadequate volume of blood received in  culture bottles   Culture  Setup Time   Final    GRAM POSITIVE COCCI IN CLUSTERS IN BOTH AEROBIC AND ANAEROBIC BOTTLES GRAM POSITIVE RODS ANAEROBIC BOTTLE ONLY Organism ID to follow CRITICAL RESULT CALLED TO, READ BACK BY AND VERIFIED WITH: PHARM D T.JACKIE AT 0814 ON 01/22/2021 BY T.SAAD    Culture (A)  Final    STAPHYLOCOCCUS HOMINIS GRAM POSITIVE RODS CULTURE REINCUBATED FOR BETTER GROWTH Performed at [facility], [address]  [place], [place] [address]    Report Status PENDING  Incomplete  Blood Culture ID Panel (Reflexed)     Status: Abnormal   Collection Time: 01/21/21 11:16 AM  Result Value Ref Range Status   Enterococcus faecalis NOT DETECTED NOT DETECTED Final   Enterococcus Faecium NOT DETECTED NOT DETECTED Final   Listeria monocytogenes NOT DETECTED NOT DETECTED Final   Staphylococcus species DETECTED (A) NOT DETECTED Final    Comment: CRITICAL RESULT CALLED TO, READ BACK BY AND VERIFIED WITH: PHARM D T.JACKIE AT 6314 ON 01/22/2021 BY T.SAAD.    Staphylococcus aureus (BCID) NOT DETECTED NOT DETECTED Final   Staphylococcus epidermidis NOT DETECTED NOT DETECTED Final   Staphylococcus lugdunensis NOT DETECTED NOT DETECTED Final   Streptococcus species NOT DETECTED NOT DETECTED Final   Streptococcus agalactiae NOT DETECTED NOT DETECTED Final   Streptococcus pneumoniae NOT DETECTED NOT DETECTED Final   Streptococcus pyogenes NOT DETECTED NOT DETECTED Final   A.calcoaceticus-baumannii NOT DETECTED NOT DETECTED Final   Bacteroides fragilis NOT DETECTED NOT DETECTED Final   Enterobacterales NOT DETECTED NOT DETECTED Final   Enterobacter cloacae complex NOT DETECTED NOT DETECTED Final   Escherichia coli NOT DETECTED NOT DETECTED Final   Klebsiella aerogenes NOT DETECTED NOT DETECTED Final   Klebsiella oxytoca NOT DETECTED NOT DETECTED Final   Klebsiella pneumoniae NOT DETECTED NOT DETECTED Final   Proteus species NOT DETECTED NOT DETECTED Final   Salmonella species NOT DETECTED NOT DETECTED Final   Serratia marcescens NOT DETECTED NOT DETECTED Final   Haemophilus influenzae NOT DETECTED NOT DETECTED Final   Neisseria meningitidis NOT DETECTED NOT DETECTED Final   Pseudomonas aeruginosa NOT DETECTED NOT DETECTED Final   Stenotrophomonas maltophilia NOT DETECTED NOT DETECTED Final   Candida albicans NOT DETECTED NOT DETECTED Final   Candida auris NOT DETECTED NOT DETECTED Final   Candida glabrata NOT DETECTED  NOT DETECTED Final   Candida krusei NOT DETECTED NOT DETECTED Final   Candida parapsilosis NOT DETECTED NOT DETECTED Final   Candida tropicalis NOT DETECTED NOT DETECTED Final   Cryptococcus neoformans/gattii NOT DETECTED NOT DETECTED Final    Comment: Performed at [facility], [address], [place], [place] [address]  Urine culture     Status: Abnormal   Collection Time: 01/21/21  1:28 PM   Specimen: In/Out Cath Urine  Result Value Ref Range Status   Specimen Description IN/OUT CATH URINE  Final   Special Requests   Final    NONE Performed at [facility], [address], [place], [place] [address]    Culture >=100,000 COLONIES/mL ESCHERICHIA COLI (A)  Final   Report Status 01/24/2021 FINAL  Final   Organism ID, Bacteria ESCHERICHIA COLI (A)  Final      Susceptibility   Escherichia coli - MIC*    AMPICILLIN 8 SENSITIVE Sensitive     CEFAZOLIN <=4 SENSITIVE Sensitive     CEFEPIME <=0.12 SENSITIVE Sensitive     CEFTRIAXONE <=0.25 SENSITIVE Sensitive     CIPROFLOXACIN <=0.25  SENSITIVE Sensitive     GENTAMICIN <=1 SENSITIVE Sensitive     IMIPENEM <=0.25 SENSITIVE Sensitive     NITROFURANTOIN <=16 SENSITIVE Sensitive     TRIMETH/SULFA <=20 SENSITIVE Sensitive     AMPICILLIN/SULBACTAM <=2 SENSITIVE Sensitive     PIP/TAZO <=4 SENSITIVE Sensitive     * >=100,000 COLONIES/mL ESCHERICHIA COLI  Resp Panel by RT-PCR (Flu A&B, Covid) Nasopharyngeal Swab     Status: None   Collection Time: 01/21/21  5:01 PM   Specimen: Nasopharyngeal Swab; Nasopharyngeal(NP) swabs in vial transport medium  Result Value Ref Range Status   SARS Coronavirus 2 by RT PCR NEGATIVE NEGATIVE Final    Comment: (NOTE) SARS-CoV-2 target nucleic acids are NOT DETECTED.  The SARS-CoV-2 RNA is generally detectable in upper respiratory specimens during the acute phase of infection. The lowest concentration of SARS-CoV-2 viral copies this assay can detect is 138 copies/mL. A negative result does not  preclude SARS-Cov-2 infection and should not be used as the sole basis for treatment or other patient management decisions. A negative result may occur with  improper specimen collection/handling, submission of specimen other than nasopharyngeal swab, presence of viral mutation(s) within the areas targeted by this assay, and inadequate number of viral copies(<138 copies/mL). A negative result must be combined with clinical observations, patient history, and epidemiological information. The expected result is Negative.  Fact Sheet for Patients:  BloggerCourse.com  Fact Sheet for Healthcare Providers:  SeriousBroker.it  This test is no t yet approved or cleared by the [place] FDA and  has been authorized for detection and/or diagnosis of SARS-CoV-2 by FDA under an Emergency Use Authorization (EUA). This EUA will remain  in effect (meaning this test can be used) for the duration of the COVID-19 declaration under Section 564(b)(1) of the Act, 21 U.S.C.section 360bbb-3(b)(1), unless the authorization is terminated  or revoked sooner.       Influenza A by PCR NEGATIVE NEGATIVE Final   Influenza B by PCR NEGATIVE NEGATIVE Final    Comment: (NOTE) The Xpert Xpress SARS-CoV-2/FLU/RSV plus assay is intended as an aid in the diagnosis of influenza from Nasopharyngeal swab specimens and should not be used as a sole basis for treatment. Nasal washings and aspirates are unacceptable for Xpert Xpress SARS-CoV-2/FLU/RSV testing.  Fact Sheet for Patients: BloggerCourse.com  Fact Sheet for Healthcare Providers: SeriousBroker.it  This test is not yet approved or cleared by the [place] FDA and has been authorized for detection and/or diagnosis of SARS-CoV-2 by FDA under an Emergency Use Authorization (EUA). This EUA will remain in effect (meaning this test can be used) for the  duration of the COVID-19 declaration under Section 564(b)(1) of the Act, 21 U.S.C. section 360bbb-3(b)(1), unless the authorization is terminated or revoked.  Performed at [facility], [address], [place], [place] [address]   Culture, blood (Routine X 2) w Reflex to ID Panel     Status: None (Preliminary result)   Collection Time: 01/23/21  1:24 PM   Specimen: BLOOD LEFT HAND  Result Value Ref Range Status   Specimen Description BLOOD LEFT HAND  Final   Special Requests   Final    BOTTLES DRAWN AEROBIC AND ANAEROBIC Blood Culture results may not be optimal due to an inadequate volume of blood received in culture bottles   Culture   Final    NO GROWTH 2 DAYS Performed at [facility], [address] [place], [place] [address]    Report Status PENDING  Incomplete  Culture, blood (Routine X 2) w Reflex to ID Panel     Status: None (Preliminary result)   Collection Time: 01/23/21  1:24 PM   Specimen: BLOOD RIGHT HAND  Result Value Ref Range Status   Specimen Description BLOOD RIGHT HAND  Final   Special Requests   Final    BOTTLES DRAWN AEROBIC AND ANAEROBIC Blood Culture results may not be optimal due to an inadequate volume of blood received in culture bottles   Culture   Final    NO GROWTH 2 DAYS Performed at [facility], [address] [place], [place] [address]    Report Status PENDING  Incomplete     Radiology Studies: ECHOCARDIOGRAM COMPLETE  Result Date: 01/25/2021    ECHOCARDIOGRAM REPORT   Patient Name:   IXEL BOEHNING Date of Exam: 01/25/2021 Medical Rec #:  [id number]       Height:       66.0 in Accession #:    [id number]      Weight:       180.6 lb Date of Birth:  20-Jul-1956        BSA:          1.915 m Patient Age:    64 years        BP:           139/74 mmHg Patient Gender: F               HR:           59 bpm. Exam Location:  Inpatient Procedure: 2D Echo, Cardiac Doppler and Color Doppler Indications:    R55 Syncope  History:        Patient has  no prior history of Echocardiogram examinations.  Sonographer:    Roosvelt Maser RDCS Referring Phys: [phone] [place]cadia Gorgas L Amand Lemoine IMPRESSIONS  1. Left ventricular ejection fraction, by estimation, is 60 to 65%. The left ventricle has normal function. The left ventricle has no regional wall motion abnormalities. There is mild concentric left ventricular hypertrophy. Left ventricular diastolic parameters are consistent with Grade I diastolic dysfunction (impaired relaxation).  2. Right ventricular systolic function is normal. The right ventricular size is normal.  3. The mitral valve is normal in structure. Trivial mitral valve regurgitation.  4. The aortic valve is tricuspid. There is mild thickening of the aortic valve. Aortic valve regurgitation is not visualized. Mild aortic valve sclerosis is present, with no evidence of aortic valve stenosis.  5. The inferior vena cava is normal in size with <50% respiratory variability, suggesting right atrial pressure of 8 mmHg. Comparison(s): No prior Echocardiogram. FINDINGS  Left Ventricle: Left ventricular ejection fraction, by estimation, is 60 to 65%. The left ventricle has normal function. The left ventricle has no regional wall motion abnormalities. The left ventricular internal cavity size was normal in size. There is  mild concentric left ventricular hypertrophy. Left ventricular diastolic parameters are consistent with Grade I diastolic dysfunction (impaired relaxation). Right Ventricle: The right ventricular size is normal. No increase in right ventricular wall thickness. Right ventricular systolic function is normal. Left Atrium: Left atrial size was normal in size. Right Atrium: Right atrial size was normal in size. Pericardium: There is no evidence of pericardial effusion. Mitral Valve: The mitral valve is normal in structure. Mild mitral annular calcification. Trivial mitral valve regurgitation. Tricuspid Valve: The tricuspid valve is normal in structure. Tricuspid  valve regurgitation is trivial. Aortic Valve: The aortic valve is tricuspid. There is mild  thickening of the aortic valve. Aortic valve regurgitation is not visualized. Mild aortic valve sclerosis is present, with no evidence of aortic valve stenosis. Aortic valve mean gradient measures 4.0 mmHg. Aortic valve peak gradient measures 7.5 mmHg. Pulmonic Valve: The pulmonic valve was normal in structure. Pulmonic valve regurgitation is trivial. Aorta: The aortic root and ascending aorta are structurally normal, with no evidence of dilitation. Venous: The inferior vena cava is normal in size with less than 50% respiratory variability, suggesting right atrial pressure of 8 mmHg. IAS/Shunts: No atrial level shunt detected by color flow Doppler.  LEFT VENTRICLE PLAX 2D LVIDd:         4.50 cm Diastology LVIDs:         3.00 cm LV e' medial:    9.36 cm/s LV PW:         0.90 cm LV E/e' medial:  7.6 LV IVS:        0.90 cm LV e' lateral:   6.20 cm/s                        LV E/e' lateral: 11.4  RIGHT VENTRICLE          IVC RV Basal diam:  3.60 cm  IVC diam: 1.50 cm LEFT ATRIUM             Index       RIGHT ATRIUM           Index LA diam:        3.40 cm 1.78 cm/m  RA Area:     25.65 cm LA Vol (A2C):   72.8 ml 38.02 ml/m RA Volume:   82.40 ml  43.03 ml/m LA Vol (A4C):   77.9 ml 40.68 ml/m LA Biplane Vol: 76.7 ml 40.06 ml/m  AORTIC VALVE AV Vmax:           137.00 cm/s AV Vmean:          93.400 cm/s AV VTI:            0.269 m AV Peak Grad:      7.5 mmHg AV Mean Grad:      4.0 mmHg LVOT Vmax:         113.00 cm/s LVOT Vmean:        67.300 cm/s LVOT VTI:          0.224 m LVOT/AV VTI ratio: 0.83  AORTA Ao Root diam: 2.80 cm Ao Asc diam:  3.10 cm MITRAL VALVE               TRICUSPID VALVE MV Area (PHT): 3.77 cm    TR Peak grad:   37.0 mmHg MV Decel Time: 201 msec    TR Vmax:        304.00 cm/s MV E velocity: 70.70 cm/s MV A velocity: 79.30 cm/s  SHUNTS MV E/A ratio:  0.89        Systemic VTI: 0.22 m Laurance Flatten MD  Electronically signed by Laurance Flatten MD Signature Date/Time: 01/25/2021/4:48:27 PM    Final    Scheduled Meds: . acetaminophen  650 mg Oral Q6H  . atenolol  50 mg Oral Daily  . docusate sodium  100 mg Oral BID  . enoxaparin (LOVENOX) injection  40 mg Subcutaneous Q24H  . hydrocerin   Topical BID  . insulin aspart  0-9 Units Subcutaneous TID WC  . levothyroxine  100 mcg Oral Daily  . lidocaine  1 patch Transdermal Q24H  .  methocarbamol  500 mg Oral TID  . ondansetron      . pantoprazole  40 mg Oral BID   Continuous Infusions: . sodium chloride 125 mL/hr at 01/26/21 0909  . sodium chloride      LOS: 5 days   Critical Care Procedure Note Authorized and Performed by: Maryln Manuel MD  Total Critical Care time:  70 mins Due to a high probability of clinically significant, life threatening deterioration, the patient required my highest level of preparedness to intervene emergently and I personally spent this critical care time directly and personally managing the patient.  This critical care time included obtaining a history; examining the patient, pulse oximetry; ordering and review of studies; arranging urgent treatment with development of a management plan; evaluation of patient's response of treatment; frequent reassessment; and discussions with other providers.  This critical care time was performed to assess and manage the high probability of imminent and life threatening deterioration that could result in multi-organ failure.  It was exclusive of separately billable procedures and treating other patients and teaching time.   Standley Dakins, MD How to contact the [facility] Attending or Consulting provider 7A - 7P or covering provider during after hours 7P -7A, for this patient?  1. Check the care team in [facility] and look for a) attending/consulting [facility] provider listed and b) the [facility] team listed 2. Log into [email or web address] and use [place]'s universal password to access. If you do not have the  password, please contact the hospital operator. 3. Locate the [facility] provider you are looking for under [facility] and page to a number that you can be directly reached. 4. If you still have difficulty reaching the provider, please page the [facility] (Director on Call) for the Hospitalists listed on amion for assistance.  01/26/2021, 9:10 AM

## 2021-01-26 NOTE — Progress Notes (Addendum)
  Reviewed EKGs with Dr. Johney Frame. 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   Will stop atenolol and start diltiazem 180 mg daily  EF normal.   Full note pending.   Casimiro Needle [address] [place], PA-C  01/26/2021 10:32 AM

## 2021-01-26 NOTE — Care Management Important Message (Signed)
Important Message  Patient Details  Name: Kristina Gates MRN: [id number] Date of Birth: 15-Oct-1956   Medicare Important Message Given:  Yes     Renie Ora 01/26/2021, 12:27 PM

## 2021-01-27 DIAGNOSIS — I471 Supraventricular tachycardia: Secondary | ICD-10-CM | POA: Diagnosis not present

## 2021-01-27 DIAGNOSIS — T796XXA Traumatic ischemia of muscle, initial encounter: Secondary | ICD-10-CM | POA: Diagnosis not present

## 2021-01-27 LAB — BASIC METABOLIC PANEL
Anion gap: 7 (ref 5–15)
BUN: <5 mg/dL — ABNORMAL LOW (ref 8–23)
CO2: 29 mmol/L (ref 22–32)
Calcium: 8.3 mg/dL — ABNORMAL LOW (ref 8.9–10.3)
Chloride: 98 mmol/L (ref 98–111)
Creatinine, Ser: 0.46 mg/dL (ref 0.44–1.00)
GFR, Estimated: >60 mL/min (ref >60)
Glucose, Bld: 105 mg/dL — ABNORMAL HIGH (ref 70–99)
Potassium: 3.7 mmol/L (ref 3.5–5.1)
Sodium: 134 mmol/L — ABNORMAL LOW (ref 135–145)

## 2021-01-27 LAB — GLUCOSE, CAPILLARY
Glucose-Capillary: 105 mg/dL — ABNORMAL HIGH (ref 70–99)
Glucose-Capillary: 113 mg/dL — ABNORMAL HIGH (ref 70–99)
Glucose-Capillary: 116 mg/dL — ABNORMAL HIGH (ref 70–99)
Glucose-Capillary: 105 mg/dL — ABNORMAL HIGH (ref 70–99)

## 2021-01-27 LAB — HEPATIC FUNCTION PANEL
ALT: 107 U/L — ABNORMAL HIGH (ref 0–44)
AST: 115 U/L — ABNORMAL HIGH (ref 15–41)
Albumin: 2.7 g/dL — ABNORMAL LOW (ref 3.5–5.0)
Alkaline Phosphatase: 59 U/L (ref 38–126)
Bilirubin, Direct: 0.2 mg/dL (ref 0.0–0.2)
Indirect Bilirubin: 0.3 mg/dL (ref 0.3–0.9)
Total Bilirubin: 0.5 mg/dL (ref 0.3–1.2)
Total Protein: 5.1 g/dL — ABNORMAL LOW (ref 6.5–8.1)

## 2021-01-27 LAB — CULTURE, BLOOD (ROUTINE X 2)

## 2021-01-27 LAB — MAGNESIUM: Magnesium: 2.1 mg/dL (ref 1.7–2.4)

## 2021-01-27 LAB — CK: Total CK: 1042 U/L — ABNORMAL HIGH (ref 38–234)

## 2021-01-27 MED ORDER — LOSARTAN POTASSIUM 50 MG PO TABS
100.0000 mg | ORAL_TABLET | Freq: Every day | ORAL | Status: DC
  Administered 2021-01-27 – 2021-02-01 (×6): 100 mg via ORAL
  Filled 2021-01-27 (×6): qty 2

## 2021-01-27 MED ORDER — SODIUM CHLORIDE 0.9 % IV SOLN: INTRAVENOUS | Status: DC

## 2021-01-27 NOTE — Progress Notes (Signed)
Inpatient Rehabilitation Admissions Coordinator  I await further progress with therapy to assess if patient can tolerate the intensity required of an CIR admit. Yesterday limited by dizziness with heart rate issues. Patient not yet at a level to pursue inpt rehab admit with her [facility].   Ottie Glazier, RN, MSN Rehab Admissions Coordinator [phone] 01/27/2021 2:34 PM

## 2021-01-27 NOTE — Progress Notes (Signed)
I concur with this student's documentation as seen in the flowsheets of this patient's chart during the shift 01/27/2021 7a-7pm.  The student is Joosung Lim.

## 2021-01-27 NOTE — Plan of Care (Signed)
  Problem: Education: Goal: Knowledge of General Education information will improve Description: Including pain rating scale, medication(s)/side effects and non-pharmacologic comfort measures Outcome: Progressing   Problem: Health Behavior/Discharge Planning: Goal: Ability to manage health-related needs will improve Outcome: Progressing   Problem: Clinical Measurements: Goal: Ability to maintain clinical measurements within normal limits will improve Outcome: Progressing Goal: Will remain free from infection Outcome: Progressing Goal: Diagnostic test results will improve Outcome: Progressing Goal: Respiratory complications will improve Outcome: Progressing Goal: Cardiovascular complication will be avoided Outcome: Progressing   Problem: Activity: Goal: Risk for activity intolerance will decrease Outcome: Progressing   Problem: Nutrition: Goal: Adequate nutrition will be maintained Outcome: Progressing   Problem: Coping: Goal: Level of anxiety will decrease Outcome: Progressing   Problem: Elimination: Goal: Will not experience complications related to bowel motility Outcome: Progressing Goal: Will not experience complications related to urinary retention Outcome: Progressing   Problem: Pain Managment: Goal: General experience of comfort will improve Outcome: Progressing   Problem: Safety: Goal: Ability to remain free from injury will improve Outcome: Progressing   Problem: Skin Integrity: Goal: Risk for impaired skin integrity will decrease Outcome: Progressing   Problem: Education: Goal: Ability to manage disease process will improve Outcome: Progressing Goal: Individualized Educational Video(s) Outcome: Progressing   Problem: Cardiac: Goal: Ability to achieve and maintain adequate cardiopulmonary perfusion will improve Outcome: Progressing   

## 2021-01-27 NOTE — Progress Notes (Signed)
PROGRESS NOTE    [name] Kristina Gates  TDV:[id number] DOB: [birth year] DOA: 01/21/2021 PCP: Patient, No Pcp Per   Brief Narrative: 65 year old with past medical history significant for hypertension, diabetes type 2, who was in her usual state of health until about 4 to 5 days prior to admission, she felt weak.  She does not remember falling.  She was found down at home, her sister did not hear from her for 2 days.  Police was asked to do a welfare check when they found patient down covered in urine. Patient was diagnosed with UTI, lactic acidosis, significant rhabdomyolysis with a CPK 20 6K, AKI.  CT head was negative for acute intracranial bleed.  X-ray revealed 4 consecutive rib fractures posteriorly without pneumothorax.  She was treated with sepsis with aggressive fluid resuscitation and initiation of broad-spectrum antibiotics.  Her mental status improved steadily.  She did develop an acute episode of SVT with heart rate 206.  She received adenosine.    Assessment & Plan:   Active Problems:   Essential hypertension   Hyperglycemia due to type 2 diabetes mellitus (HCC)   Postoperative hypothyroidism   Pure hypercholesterolemia   Sepsis (HCC)   Traumatic rhabdomyolysis (HCC)   Diabetes mellitus without complication (HCC)   SVT (supraventricular tachycardia) (HCC)  1-Recurrent SVT: -Had episode of SVT in the ED received same. -Patient had episode of SVT 3/8.  She received 6 mg of adenosine.  At the moment her blood pressure was 88/60.  She also received IV fluids. -Patient was transferred to progressive bed. -Audiology was consulted.  Atenolol was changed to Cardizem.  No further episode. -She will need to follow-up with EP as an outpatient.  2-Sepsis: secondary to UTI. Presents with tachycardia, tachypnea, leukocytosis.  Resolved: ID was consulted.  Off of antibiotics.  3-Nontraumatic Rhabdomyolysis:  -Presented with a CK level more than 20 6K.  She received aggressive IV fluids.  CK  level now to 1000 -Continue with IV fluids, decrease rate.   4-Metabolic acidosis: Improved with bicarb solution. 5-E. coli UTI: Off of antibiotics. Received 3 days treatment. Urine grew E coli,.  6-Staph bacteremia: ID ID consulted, likely a contaminant.  Repeat blood cultures 01/23/2021 no growth today. 7-rib fractures: Pain management. 8-hypokalemia hypomagnesemia: Replace 9-Transaminases: Likely related to elevated CK level 10-elevated troponin: Suspected related to rhabdomyolysis. 11-AKI resolved with IV fluids 12-gastritis: On Protonix 13-left parotid gland lesion: Need outpatient follow-up 14-Hypertension: Continue to hold hydrochlorothiazide .  Started on Cardizem Resume losartan.    Estimated body mass index is 31.92 kg/m as calculated from the following:   Height as of this encounter: 5\' 6"  (1.676 m).   Weight as of this encounter: 89.7 kg.   DVT prophylaxis: Lovenox Code Status: Full code Family Communication: Care discussed with patient Disposition Plan:  Status is: Inpatient  Remains inpatient appropriate because:Unsafe d/c plan   Dispo: The patient is from: Home              Anticipated d/c is to: CIR              Patient currently is medically stable to d/c.   Difficult to place patient No        Consultants:   EP EP  ID  Procedures:   NA  Antimicrobials:  Cefepime from 3/3 until 3/6 Vancomycin from 3/3 on 33/6  Subjective: She is feeling better, no new complaints. No palpitations.   Objective: Vitals:   01/26/21 2056 01/27/21 0404 01/27/21 0726 01/27/21 1151  BP: (!) 148/73 (!) 141/76 (!) 157/80   Pulse: 67 76    Resp: (!) 24  Temp: 98.4 F (36.9 C) 98.4 F (36.9 C) 98.2 F (36.8 C) 98.6 F (37 C)  TempSrc: Oral Oral Oral Oral  SpO2: 97% 95%    Weight:  89.7 kg    Height:        Intake/Output Summary (Last 24 hours) at 01/27/2021 1355 Last data filed at 01/27/2021 0837 Gross per 24 hour  Intake 240 ml  Output 1900 ml   Net -1660 ml   Filed Weights   01/21/21 2317 01/22/21 1100 01/27/21 0404  Weight: 81.9 kg 81.9 kg 89.7 kg    Examination:  General exam: Appears calm and comfortable  Respiratory system: Clear to auscultation. Respiratory effort normal. Cardiovascular system: S1 & S2 heard, RRR. No JVD, murmurs, rubs, gallops or clicks. No pedal edema. Gastrointestinal system: Abdomen is nondistended, soft and nontender. No organomegaly or masses felt. Normal bowel sounds heard. Central nervous system: Alert and oriented.  Extremities: Symmetric 5 x 5 power.    Data Reviewed: I have personally reviewed following labs and imaging studies  CBC: Recent Labs  Lab 01/21/21 1116 01/21/21 1731 01/21/21 2139 01/22/21 0105 01/23/21 0131 01/24/21 0049 01/25/21 0545  WBC 20.8*  --  16.6* 16.0* 10.8* 8.3 6.9  NEUTROABS 18.2*  --   --   --  8.4* 5.6 4.3  HGB 16.4*   < > 13.2 13.0 11.5* 11.2* 12.0  HCT 46.0   < > 35.8* 38.0 32.9* 31.0* 34.3*  MCV 91.3  --  91.1 93.8 95.4 92.5 92.5  PLT 188  --  152 149* 126* 134* 160   < > = values in this interval not displayed.   Basic Metabolic Panel: Recent Labs  Lab 01/21/21 1708 01/21/21 1731 01/23/21 0131 01/24/21 0049 01/25/21 0545 01/26/21 0101 01/27/21 0229  NA  --    < > 137 136 137 135 134*  K 3.2*   < > 3.9 3.3* 3.6 3.3* 3.7  CL  --    < > 115* 108 100 95* 98  CO2  --    < > 14* 21* 29 33* 29  GLUCOSE  --    < > 89 128* 141* 122* 105*  BUN  --    < > 12 8 <5* <5* <5*  CREATININE  --    < > 0.66 0.56 0.51 0.50 0.46  CALCIUM  --    < > 7.9* 8.1* 8.1* 8.1* 8.3*  MG 1.5*   < > 1.9 1.6* 1.8 1.6* 2.1  PHOS 3.1  --   --   --   --   --   --    < > = values in this interval not displayed.   GFR: Estimated Creatinine Clearance: 78.1 mL/min (by C-G formula based on SCr of 0.46 mg/dL). Liver Function Tests: Recent Labs  Lab 01/22/21 0105 01/23/21 0131 01/24/21 0049 01/25/21 0545 01/27/21 0229  AST 284* 223* 171* 107* 115*  ALT 114* 109*  103* 90* 107*  ALKPHOS 42 40 40 42 59  BILITOT 1.9* 1.5* 1.2 0.6 0.5  PROT 5.4* 4.9* 4.9* 4.9* 5.1*  ALBUMIN 2.8* 2.6* 2.6* 2.5* 2.7*   No results for input(s): LIPASE, AMYLASE in the last 168 hours. Recent Labs  Lab 01/21/21 1116  AMMONIA 25   Coagulation Profile: Recent Labs  Lab 01/21/21 1206  INR 1.1   Cardiac Enzymes: Recent Labs  Lab 01/23/21  0131 01/24/21 0049 01/25/21 0545 01/26/21 0101 01/27/21 0229  CKTOTAL 6,745* 3,938* 1,829* 1,423* 1,042*   BNP (last 3 results) No results for input(s): PROBNP in the last 8760 hours. HbA1C: No results for input(s): HGBA1C in the last 72 hours. CBG: Recent Labs  Lab 01/26/21 1202 01/26/21 1635 01/26/21 2059 01/27/21 0750 01/27/21 1157  GLUCAP 147* 116* 115* 105* 113*   Lipid Profile: No results for input(s): CHOL, HDL, LDLCALC, TRIG, CHOLHDL, LDLDIRECT in the last 72 hours. Thyroid Function Tests: No results for input(s): TSH, T4TOTAL, FREET4, T3FREE, THYROIDAB in the last 72 hours. Anemia Panel: No results for input(s): VITAMINB12, FOLATE, FERRITIN, TIBC, IRON, RETICCTPCT in the last 72 hours. Sepsis Labs: Recent Labs  Lab 01/21/21 1353 01/21/21 1552  LATICACIDVEN 5.9* 1.3    Recent Results (from the past 240 hour(s))  Blood Cultures (routine x 2)     Status: None   Collection Time: 01/21/21 11:10 AM   Specimen: BLOOD  Result Value Ref Range Status   Specimen Description BLOOD RIGHT ANTECUBITAL  Final   Special Requests   Final    BOTTLES DRAWN AEROBIC AND ANAEROBIC Blood Culture results may not be optimal due to an excessive volume of blood received in culture bottles   Culture   Final    NO GROWTH 5 DAYS Performed at [facility], [address], [place], [place] [address]    Report Status 01/26/2021 FINAL  Final  Blood Cultures (routine x 2)     Status: Abnormal (Preliminary result)   Collection Time: 01/21/21 11:16 AM   Specimen: BLOOD  Result Value Ref Range Status   Specimen Description  BLOOD SITE NOT SPECIFIED  Final   Special Requests   Final    BOTTLES DRAWN AEROBIC AND ANAEROBIC Blood Culture results may not be optimal due to an inadequate volume of blood received in culture bottles   Culture  Setup Time   Final    GRAM POSITIVE COCCI IN CLUSTERS IN BOTH AEROBIC AND ANAEROBIC BOTTLES GRAM POSITIVE RODS ANAEROBIC BOTTLE ONLY Organism ID to follow CRITICAL RESULT CALLED TO, READ BACK BY AND VERIFIED WITH: PHARM D T.JACKIE AT 6045 ON 01/22/2021 BY T.SAAD    Culture (A)  Final    STAPHYLOCOCCUS HOMINIS GRAM POSITIVE RODS CULTURE REINCUBATED FOR BETTER GROWTH Performed at [facility], [address], [place], [place] [address]    Report Status PENDING  Incomplete  Blood Culture ID Panel (Reflexed)     Status: Abnormal   Collection Time: 01/21/21 11:16 AM  Result Value Ref Range Status   Enterococcus faecalis NOT DETECTED NOT DETECTED Final   Enterococcus Faecium NOT DETECTED NOT DETECTED Final   Listeria monocytogenes NOT DETECTED NOT DETECTED Final   Staphylococcus species DETECTED (A) NOT DETECTED Final    Comment: CRITICAL RESULT CALLED TO, READ BACK BY AND VERIFIED WITH: PHARM D T.JACKIE AT 1914 ON 01/22/2021 BY T.SAAD.    Staphylococcus aureus (BCID) NOT DETECTED NOT DETECTED Final   Staphylococcus epidermidis NOT DETECTED NOT DETECTED Final   Staphylococcus lugdunensis NOT DETECTED NOT DETECTED Final   Streptococcus species NOT DETECTED NOT DETECTED Final   Streptococcus agalactiae NOT DETECTED NOT DETECTED Final   Streptococcus pneumoniae NOT DETECTED NOT DETECTED Final   Streptococcus pyogenes NOT DETECTED NOT DETECTED Final   A.calcoaceticus-baumannii NOT DETECTED NOT DETECTED Final   Bacteroides fragilis NOT DETECTED NOT DETECTED Final   Enterobacterales NOT DETECTED NOT DETECTED Final   Enterobacter cloacae complex NOT DETECTED NOT DETECTED Final  Escherichia coli NOT DETECTED NOT DETECTED Final   Klebsiella aerogenes NOT DETECTED NOT  DETECTED Final   Klebsiella oxytoca NOT DETECTED NOT DETECTED Final   Klebsiella pneumoniae NOT DETECTED NOT DETECTED Final   Proteus species NOT DETECTED NOT DETECTED Final   Salmonella species NOT DETECTED NOT DETECTED Final   Serratia marcescens NOT DETECTED NOT DETECTED Final   Haemophilus influenzae NOT DETECTED NOT DETECTED Final   Neisseria meningitidis NOT DETECTED NOT DETECTED Final   Pseudomonas aeruginosa NOT DETECTED NOT DETECTED Final   Stenotrophomonas maltophilia NOT DETECTED NOT DETECTED Final   Candida albicans NOT DETECTED NOT DETECTED Final   Candida auris NOT DETECTED NOT DETECTED Final   Candida glabrata NOT DETECTED NOT DETECTED Final   Candida krusei NOT DETECTED NOT DETECTED Final   Candida parapsilosis NOT DETECTED NOT DETECTED Final   Candida tropicalis NOT DETECTED NOT DETECTED Final   Cryptococcus neoformans/gattii NOT DETECTED NOT DETECTED Final    Comment: Performed at [facility], [address] [place], [place] [address]  Urine culture     Status: Abnormal   Collection Time: 01/21/21  1:28 PM   Specimen: In/Out Cath Urine  Result Value Ref Range Status   Specimen Description IN/OUT CATH URINE  Final   Special Requests   Final    NONE Performed at [facility], [address] [place], [place] [address]    Culture >=100,000 COLONIES/mL ESCHERICHIA COLI (A)  Final   Report Status 01/24/2021 FINAL  Final   Organism ID, Bacteria ESCHERICHIA COLI (A)  Final      Susceptibility   Escherichia coli - MIC*    AMPICILLIN 8 SENSITIVE Sensitive     CEFAZOLIN <=4 SENSITIVE Sensitive     CEFEPIME <=0.12 SENSITIVE Sensitive     CEFTRIAXONE <=0.25 SENSITIVE Sensitive     CIPROFLOXACIN <=0.25 SENSITIVE Sensitive     GENTAMICIN <=1 SENSITIVE Sensitive     IMIPENEM <=0.25 SENSITIVE Sensitive     NITROFURANTOIN <=16 SENSITIVE Sensitive     TRIMETH/SULFA <=20 SENSITIVE Sensitive     AMPICILLIN/SULBACTAM <=2 SENSITIVE Sensitive     PIP/TAZO <=4  SENSITIVE Sensitive     * >=100,000 COLONIES/mL ESCHERICHIA COLI  Resp Panel by RT-PCR (Flu A&B, Covid) Nasopharyngeal Swab     Status: None   Collection Time: 01/21/21  5:01 PM   Specimen: Nasopharyngeal Swab; Nasopharyngeal(NP) swabs in vial transport medium  Result Value Ref Range Status   SARS Coronavirus 2 by RT PCR NEGATIVE NEGATIVE Final    Comment: (NOTE) SARS-CoV-2 target nucleic acids are NOT DETECTED.  The SARS-CoV-2 RNA is generally detectable in upper respiratory specimens during the acute phase of infection. The lowest concentration of SARS-CoV-2 viral copies this assay can detect is 138 copies/mL. A negative result does not preclude SARS-Cov-2 infection and should not be used as the sole basis for treatment or other patient management decisions. A negative result may occur with  improper specimen collection/handling, submission of specimen other than nasopharyngeal swab, presence of viral mutation(s) within the areas targeted by this assay, and inadequate number of viral copies(<138 copies/mL). A negative result must be combined with clinical observations, patient history, and epidemiological information. The expected result is Negative.  Fact Sheet for Patients:  BloggerCourse.comhttps://www.fda.gov/media/152166/download  Fact Sheet for Healthcare Providers:  SeriousBroker.ithttps://www.fda.gov/media/152162/download  This test is no t yet approved or cleared by the [place] FDA and  has been authorized for detection and/or diagnosis of SARS-CoV-2 by FDA under an Emergency Use Authorization (EUA).  This EUA will remain  in effect (meaning this test can be used) for the duration of the COVID-19 declaration under Section 564(b)(1) of the Act, 21 U.S.C.section 360bbb-3(b)(1), unless the authorization is terminated  or revoked sooner.       Influenza A by PCR NEGATIVE NEGATIVE Final   Influenza B by PCR NEGATIVE NEGATIVE Final    Comment: (NOTE) The Xpert Xpress SARS-CoV-2/FLU/RSV plus assay  is intended as an aid in the diagnosis of influenza from Nasopharyngeal swab specimens and should not be used as a sole basis for treatment. Nasal washings and aspirates are unacceptable for Xpert Xpress SARS-CoV-2/FLU/RSV testing.  Fact Sheet for Patients: BloggerCourse.com  Fact Sheet for Healthcare Providers: SeriousBroker.it  This test is not yet approved or cleared by the [place] FDA and has been authorized for detection and/or diagnosis of SARS-CoV-2 by FDA under an Emergency Use Authorization (EUA). This EUA will remain in effect (meaning this test can be used) for the duration of the COVID-19 declaration under Section 564(b)(1) of the Act, 21 U.S.C. section 360bbb-3(b)(1), unless the authorization is terminated or revoked.  Performed at [facility], [address], [place], [place] [address]   Culture, blood (Routine X 2) w Reflex to ID Panel     Status: None (Preliminary result)   Collection Time: 01/23/21  1:24 PM   Specimen: BLOOD LEFT HAND  Result Value Ref Range Status   Specimen Description BLOOD LEFT HAND  Final   Special Requests   Final    BOTTLES DRAWN AEROBIC AND ANAEROBIC Blood Culture results may not be optimal due to an inadequate volume of blood received in culture bottles   Culture   Final    NO GROWTH 4 DAYS Performed at [facility], [address], [place], [place] [address]    Report Status PENDING  Incomplete  Culture, blood (Routine X 2) w Reflex to ID Panel     Status: None (Preliminary result)   Collection Time: 01/23/21  1:24 PM   Specimen: BLOOD RIGHT HAND  Result Value Ref Range Status   Specimen Description BLOOD RIGHT HAND  Final   Special Requests   Final    BOTTLES DRAWN AEROBIC AND ANAEROBIC Blood Culture results may not be optimal due to an inadequate volume of blood received in culture bottles   Culture   Final    NO GROWTH 4 DAYS Performed at [facility]  Lab, [address], [place], [place] [address]    Report Status PENDING  Incomplete         Radiology Studies: ECHOCARDIOGRAM COMPLETE  Result Date: 01/25/2021    ECHOCARDIOGRAM REPORT   Patient Name:   MARTY SADLOWSKI Date of Exam: 01/25/2021 Medical Rec #:  [id number]       Height:       66.0 in Accession #:    [id number]      Weight:       180.6 lb Date of Birth:  [birth year]        BSA:          1.915 m Patient Age:    64 years        BP:           139/74 mmHg Patient Gender: F               HR:           59 bpm. Exam Location:  Inpatient Procedure: 2D Echo, Cardiac Doppler and  Color Doppler Indications:    R55 Syncope  History:        Patient has no prior history of Echocardiogram examinations.  Sonographer:    Roosvelt Maser RDCS Referring Phys: [phone] CLANFORD L JOHNSON IMPRESSIONS  1. Left ventricular ejection fraction, by estimation, is 60 to 65%. The left ventricle has normal function. The left ventricle has no regional wall motion abnormalities. There is mild concentric left ventricular hypertrophy. Left ventricular diastolic parameters are consistent with Grade I diastolic dysfunction (impaired relaxation).  2. Right ventricular systolic function is normal. The right ventricular size is normal.  3. The mitral valve is normal in structure. Trivial mitral valve regurgitation.  4. The aortic valve is tricuspid. There is mild thickening of the aortic valve. Aortic valve regurgitation is not visualized. Mild aortic valve sclerosis is present, with no evidence of aortic valve stenosis.  5. The inferior vena cava is normal in size with <50% respiratory variability, suggesting right atrial pressure of 8 mmHg. Comparison(s): No prior Echocardiogram. FINDINGS  Left Ventricle: Left ventricular ejection fraction, by estimation, is 60 to 65%. The left ventricle has normal function. The left ventricle has no regional wall motion abnormalities. The left ventricular internal cavity size was normal in size. There is  mild  concentric left ventricular hypertrophy. Left ventricular diastolic parameters are consistent with Grade I diastolic dysfunction (impaired relaxation). Right Ventricle: The right ventricular size is normal. No increase in right ventricular wall thickness. Right ventricular systolic function is normal. Left Atrium: Left atrial size was normal in size. Right Atrium: Right atrial size was normal in size. Pericardium: There is no evidence of pericardial effusion. Mitral Valve: The mitral valve is normal in structure. Mild mitral annular calcification. Trivial mitral valve regurgitation. Tricuspid Valve: The tricuspid valve is normal in structure. Tricuspid valve regurgitation is trivial. Aortic Valve: The aortic valve is tricuspid. There is mild thickening of the aortic valve. Aortic valve regurgitation is not visualized. Mild aortic valve sclerosis is present, with no evidence of aortic valve stenosis. Aortic valve mean gradient measures 4.0 mmHg. Aortic valve peak gradient measures 7.5 mmHg. Pulmonic Valve: The pulmonic valve was normal in structure. Pulmonic valve regurgitation is trivial. Aorta: The aortic root and ascending aorta are structurally normal, with no evidence of dilitation. Venous: The inferior vena cava is normal in size with less than 50% respiratory variability, suggesting right atrial pressure of 8 mmHg. IAS/Shunts: No atrial level shunt detected by color flow Doppler.  LEFT VENTRICLE PLAX 2D LVIDd:         4.50 cm Diastology LVIDs:         3.00 cm LV e' medial:    9.36 cm/s LV PW:         0.90 cm LV E/e' medial:  7.6 LV IVS:        0.90 cm LV e' lateral:   6.20 cm/s                        LV E/e' lateral: 11.4  RIGHT VENTRICLE          IVC RV Basal diam:  3.60 cm  IVC diam: 1.50 cm LEFT ATRIUM             Index       RIGHT ATRIUM           Index LA diam:        3.40 cm 1.78 cm/m  RA Area:     25.65 cm LA  Vol [facility]):   72.8 ml 38.02 ml/m RA Volume:   82.40 ml  43.03 ml/m LA Vol (A4C):   77.9 ml  40.68 ml/m LA Biplane Vol: 76.7 ml 40.06 ml/m  AORTIC VALVE AV Vmax:           137.00 cm/s AV Vmean:          93.400 cm/s AV VTI:            0.269 m AV Peak Grad:      7.5 mmHg AV Mean Grad:      4.0 mmHg LVOT Vmax:         113.00 cm/s LVOT Vmean:        67.300 cm/s LVOT VTI:          0.224 m LVOT/AV VTI ratio: 0.83  AORTA Ao Root diam: 2.80 cm Ao Asc diam:  3.10 cm MITRAL VALVE               TRICUSPID VALVE MV Area (PHT): 3.77 cm    TR Peak grad:   37.0 mmHg MV Decel Time: 201 msec    TR Vmax:        304.00 cm/s MV E velocity: 70.70 cm/s MV A velocity: 79.30 cm/s  SHUNTS MV E/A ratio:  0.89        Systemic VTI: 0.22 m Laurance Flatten MD Electronically signed by Laurance Flatten MD Signature Date/Time: 01/25/2021/4:48:27 PM    Final         Scheduled Meds: . acetaminophen  650 mg Oral Q6H  . diltiazem  180 mg Oral Daily  . docusate sodium  100 mg Oral BID  . enoxaparin (LOVENOX) injection  40 mg Subcutaneous Q24H  . hydrocerin   Topical BID  . insulin aspart  0-9 Units Subcutaneous TID WC  . levothyroxine  100 mcg Oral Daily  . lidocaine  1 patch Transdermal Q24H  . methocarbamol  500 mg Oral TID  . pantoprazole  40 mg Oral BID   Continuous Infusions: . sodium chloride 75 mL/hr at 01/27/21 1058     LOS: 6 days    Time spent: 35 minutes    Debra Calabretta A Zasha Belleau, MD [facility]   If 7PM-7AM, please contact night-coverage [email or web address]  01/27/2021, 1:55 PM

## 2021-01-28 DIAGNOSIS — I471 Supraventricular tachycardia: Secondary | ICD-10-CM | POA: Diagnosis not present

## 2021-01-28 LAB — BASIC METABOLIC PANEL
Anion gap: 9 (ref 5–15)
BUN: 5 mg/dL — ABNORMAL LOW (ref 8–23)
CO2: 26 mmol/L (ref 22–32)
Calcium: 8.8 mg/dL — ABNORMAL LOW (ref 8.9–10.3)
Chloride: 101 mmol/L (ref 98–111)
Creatinine, Ser: 0.55 mg/dL (ref 0.44–1.00)
GFR, Estimated: >60 mL/min (ref >60)
Glucose, Bld: 98 mg/dL (ref 70–99)
Potassium: 3.5 mmol/L (ref 3.5–5.1)
Sodium: 136 mmol/L (ref 135–145)

## 2021-01-28 LAB — CULTURE, BLOOD (ROUTINE X 2)
Culture: NO GROWTH
Culture: NO GROWTH

## 2021-01-28 LAB — CK: Total CK: 768 U/L — ABNORMAL HIGH (ref 38–234)

## 2021-01-28 LAB — GLUCOSE, CAPILLARY
Glucose-Capillary: 118 mg/dL — ABNORMAL HIGH (ref 70–99)
Glucose-Capillary: 119 mg/dL — ABNORMAL HIGH (ref 70–99)
Glucose-Capillary: 98 mg/dL (ref 70–99)
Glucose-Capillary: 115 mg/dL — ABNORMAL HIGH (ref 70–99)

## 2021-01-28 LAB — MAGNESIUM: Magnesium: 1.6 mg/dL — ABNORMAL LOW (ref 1.7–2.4)

## 2021-01-28 MED ORDER — MAGNESIUM SULFATE 2 GM/50ML IV SOLN
2.0000 g | Freq: Once | INTRAVENOUS | Status: AC
  Administered 2021-01-28: 2 g via INTRAVENOUS
  Filled 2021-01-28: qty 50

## 2021-01-28 MED ORDER — MAGNESIUM OXIDE 400 (241.3 MG) MG PO TABS
200.0000 mg | ORAL_TABLET | Freq: Two times a day (BID) | ORAL | Status: DC
  Administered 2021-01-28 – 2021-02-01 (×10): 200 mg via ORAL
  Filled 2021-01-28 (×10): qty 1

## 2021-01-28 MED ORDER — POTASSIUM CHLORIDE CRYS ER 20 MEQ PO TBCR
40.0000 meq | EXTENDED_RELEASE_TABLET | Freq: Once | ORAL | Status: AC
  Administered 2021-01-28: 40 meq via ORAL
  Filled 2021-01-28: qty 2

## 2021-01-28 NOTE — Evaluation (Signed)
Occupational Therapy Evaluation Patient Details Name: Kristina Gates MRN: [id number] DOB: [birth year] Today's Date: 01/28/2021    History of Present Illness Pt is a 65 y.o. female admitted 01/21/21 after being found down at home for unknown time (sister had not heard from her for 2 days). Workup for UTI, urosepsis, AKI, rhabdomyolysis, multiple L rib fxs. Head CT negative for acute abnormality. Episode of SVT 3/8. PMH includes HTN, DM2.   Clinical Impression   Patient admitted for the above diagnosis.  PTA she was independent with all mobility and care.  Deficits impacting independence are listed below.  Currently she is needing up to Min guard for all mobility at a RW level.  She is very unsteady, and has noted LOB without an assistive device, needing up to min A to prevent falling.  She is also needing Min Guard and RW for support to complete ADLs from a sit/stand level.  Again, without the RW, she is having to reach for objects in her environment to stabilize herself.  This is a significant departure from her prior level of function.  Acute OT is indicated to maximize her functional status.  CIR is recommended, and given her prior level of independence, intensive rehab is very appropriate.  She can withstand 3 hours of rehab each day.      Follow Up Recommendations  CIR    Equipment Recommendations  Tub/shower seat    Recommendations for Other Services       Precautions / Restrictions Precautions Precautions: Fall;Other (comment) Precaution Comments: L rib fxs Restrictions Weight Bearing Restrictions: No      Mobility Bed Mobility Overal bed mobility: Needs Assistance Bed Mobility: Sit to Supine     Supine to sit: Min assist Sit to supine: Min assist   General bed mobility comments: Cues for sequencing - when asking pt to sit up, pt stating, "Now how should I do that?" - use of bed rail, increased time and effort with minA to scoot hips to EOB    Transfers Overall transfer  level: Needs assistance Equipment used: Rolling walker (2 wheeled) Transfers: Sit to/from Stand Sit to Stand: Min assist         General transfer comment: patient looks for guidence and afirmation that she's doing tasks correctly.    Balance Overall balance assessment: Needs assistance Sitting-balance support: Feet supported;No upper extremity supported Sitting balance-Leahy Scale: Fair     Standing balance support: Bilateral upper extremity supported;During functional activity Standing balance-Leahy Scale: Poor Standing balance comment: needing Rw for support                           ADL either performed or assessed with clinical judgement   ADL Overall ADL's : Needs assistance/impaired Eating/Feeding: Independent;Sitting   Grooming: Wash/dry hands;Wash/dry face;Min guard;Standing           Upper Body Dressing : Min guard;Standing   Lower Body Dressing: Minimal assistance;Sit to/from stand   Toilet Transfer: Min guard;RW;Ambulation;Comfort height toilet           Functional mobility during ADLs: Min guard;Rolling walker;Cueing for safety;Cueing for sequencing       Vision Baseline Vision/History: Wears glasses Wears Glasses: At all times Patient Visual Report: No change from baseline       Perception     Praxis      Pertinent Vitals/Pain Pain Assessment: 0-10 Pain Score: 4  Faces Pain Scale: Hurts little more Pain Location: L-side ribs Pain Descriptors /  Indicators: Discomfort;Sore;Guarding Pain Intervention(s): Monitored during session     Hand Dominance Right   Extremity/Trunk Assessment Upper Extremity Assessment Upper Extremity Assessment: Generalized weakness LUE Deficits / Details: ROM is improving, but weakness is noted.   Lower Extremity Assessment Lower Extremity Assessment: Defer to PT evaluation   Cervical / Trunk Assessment Cervical / Trunk Assessment: Normal   Communication Communication Communication: No  difficulties   Cognition Arousal/Alertness: Awake/alert Behavior During Therapy: WFL for tasks assessed/performed;Anxious Overall Cognitive Status: Impaired/Different from baseline Area of Impairment: Attention;Memory;Following commands;Safety/judgement;Awareness;Problem solving                   Current Attention Level: Selective Memory: Decreased short-term memory Following Commands: Follows one step commands with increased time Safety/Judgement: Decreased awareness of deficits Awareness: Emergent Problem Solving: Slow processing;Difficulty sequencing;Requires verbal cues General Comments: Patient states she feels unstaedy and unsure of herself.  She did well with immediate recall and ST memory when tested.  She did well with complex thought/money management as well, but requires VC's and increased time with problem solving.  She needs time to process commands, and trys to work out strategies for tasks that should be automatic given her PLOF.  For example: stand up, walk around the foot of the bed, and sit on the other side of the bed. Multiple commands at one time require increased time and cueing to complete.  Further cognitive testing is recommended.   General Comments  SpO2 96% on RA, HR 88; supine BP 159/72, sitting BP 174/86, post-transfer BP 158/83, post-ambulation BP 169/81               Home Living Family/patient expects to be discharged to:: Inpatient rehab Living Arrangements: Alone Available Help at Discharge: Family;Available 24 hours/day Type of Home: House Home Access: Stairs to enter [employer] of Steps: thresold   Home Layout:  (townhome)     Bathroom Shower/Tub: Chief Strategy Officer: Standard Bathroom Accessibility: Yes How Accessible: Accessible via walker     Additional Comments: patient perfers to take baths, but will complete standing showers as well.  Lives With: Alone    Prior Functioning/Environment Level of  Independence: Independent        Comments: Patient independent with ADL, home mangement, meals, medication managment, bill payment.  Did not need AD for mobility.        OT Problem List: Decreased strength;Impaired balance (sitting and/or standing);Decreased cognition;Pain      OT Treatment/Interventions: Self-care/ADL training;Balance training;Cognitive remediation/compensation;Therapeutic activities    OT Goals(Current goals can be found in the care plan section) Acute Rehab OT Goals Patient Stated Goal: They said I'm going for more rehab. OT Goal Formulation: With patient Time For Goal Achievement: 02/11/21 Potential to Achieve Goals: Good ADL Goals Pt Will Perform Grooming: Independently;standing;sitting Pt Will Perform Lower Body Bathing: Independently;sit to/from stand Pt Will Perform Lower Body Dressing: Independently;sit to/from stand Pt Will Transfer to Toilet: Independently;ambulating;regular height toilet Pt Will Perform Toileting - Clothing Manipulation and hygiene: Independently;sit to/from stand  OT Frequency: Min 2X/week   Barriers to D/C:    none noted       Co-evaluation              AM-PAC OT "6 Clicks" Daily Activity     Outcome Measure Help from another person eating meals?: None Help from another person taking care of personal grooming?: A Little Help from another person toileting, which includes using toliet, bedpan, or urinal?: A Little Help from another person  bathing (including washing, rinsing, drying)?: A Little Help from another person to put on and taking off regular upper body clothing?: A Little Help from another person to put on and taking off regular lower body clothing?: A Little 6 Click Score: 19   End of Session Equipment Utilized During Treatment: Rolling walker Nurse Communication: Other (comment)  Activity Tolerance: Patient tolerated treatment well Patient left: in bed;with call bell/phone within reach;with bed alarm  set  OT Visit Diagnosis: Unsteadiness on feet (R26.81);Muscle weakness (generalized) (M62.81);History of falling (Z91.81);Other symptoms and signs involving cognitive function;Dizziness and giddiness (R42);Pain                Time: 6160-7371 OT Time Calculation (min): 20 min Charges:  OT General Charges $OT Visit: 1 Visit OT Evaluation $OT Eval Moderate Complexity: 1 Mod  01/28/2021  Rich, OTR/L  Acute Rehabilitation Services  Office:  [phone]   Suzanna Obey 01/28/2021, 1:55 PM

## 2021-01-28 NOTE — PMR Pre-admission (Shared)
PMR Admission Coordinator Pre-Admission Assessment  Patient: Kristina Gates is an 65 y.o., female MRN: [id number] DOB: 1956-07-26 Height: _0  (167.6 cm) Weight: 88.4 kg  Insurance Information HMO: ***    PPO: ***     PCP: ***     IPA: ***     80/20: ***     OTHER: *** PRIMARY: [employer] Medicare     Policy#: [id number]      Subscriber: pt CM Name: ***      Phone#: [phone]     Fax#: [phone] Pre-Cert#: ***      Employer: *** Benefits:  Phone #: [phone]     Name: *** Eff. Date: ***     Deduct: ***      Out of Pocket Max: ***      Life Max: *** CIR: ***      SNF: *** Outpatient: ***     Co-Pay: *** Home Health: ***      Co-Pay: *** DME: ***     Co-Pay: *** Providers: in network  SECONDARY: none  Financial Counselor:       Phone#:   [employer]" for patients in Inpatient Rehabilitation Facilities with attached "Privacy Act [place] [facility] Records" was provided and verbally reviewed with: Patient  Emergency Contact Information Contact Information    Name Relation Home Work [place], [place] Sister [phone]     [facility] Denman George [phone]        Current Medical History  Patient Admitting Diagnosis: Debility  History of Present Illness: 65 year old female with history of HTN, DM2 who was in her usual states of health until 4 to 5 days prior to admit. Presented on 01/21/2021. She had felt weak and had difficulty walking. She was found down when her sister in [place] was unable to reach her by phone so police were contacted for a welfare check. Patient was found down on th floor covered in urine.  Patient was diagnosed with UTI, lactic acidosis, significant rhabdomyolysis with AKI. CT head negative for acute intracranial bleed. Xray revealed 4 consecutive rib fractures posteriorly without pneumothorax. She was treated for sepsis with aggressive fluid resuscitation and initiation of broad spectrum antibiotics. Her mental status did  improve.   She developed an episode of SVT on 3/8 and treated with IV adenosine. Her BP at that time was 88/60 and she received IV fluids. Patient transferred for closer monitoring to a progressive unit; Cardiology consulted with meds changed to Cardizem. No further episode and she is to follow up with EP as as outpatient.   E coli UTI treated with 3 days of treatment.   Patient's medical record from [facility]  has been reviewed by the rehabilitation admission coordinator and physician.  Past Medical History  Past Medical History:  Diagnosis Date  . Diabetes mellitus without complication ([place])   . Hypercholesteremia   . Hypertension   . Postprocedural hypothyroidism     Family History   family history is not on file.  Prior Rehab/Hospitalizations Has the patient had prior rehab or hospitalizations prior to admission? Yes  Has the patient had major surgery during 100 days prior to admission? No   Current Medications  Current Facility-Administered Medications:  .  acetaminophen (TYLENOL) tablet 650 mg, 650 mg, Oral, Q6H, Norm Parcel, PA-C, 650 mg at 01/28/21 1116 .  adenosine (ADENOCARD) 12 MG/4ML injection 12 mg, 12 mg, Intravenous, Once PRN, Johnson, Clanford L, MD .  diltiazem (CARDIZEM CD) 24 hr  capsule 180 mg, 180 mg, Oral, Daily, Shirley Friar, PA-C, 180 mg at 01/28/21 4174 .  docusate sodium (COLACE) capsule 100 mg, 100 mg, Oral, BID, Norm Parcel, PA-C, 100 mg at 01/28/21 0814 .  enoxaparin (LOVENOX) injection 40 mg, 40 mg, Subcutaneous, Q24H, Johnson, Clanford L, MD, 40 mg at 01/28/21 4818 .  hydrocerin (EUCERIN) cream, , Topical, BID, Murlean Iba, MD, Given at 01/28/21 0825 .  HYDROcodone-acetaminophen (NORCO/VICODIN) 5-325 MG per tablet 1-2 tablet, 1-2 tablet, Oral, Q4H PRN, Bonnell Public Tublu, MD .  insulin aspart (novoLOG) injection 0-9 Units, 0-9 Units, Subcutaneous, TID WC, Vashti Hey, MD, 1 Units at 01/26/21  1215 .  levothyroxine (SYNTHROID) tablet 100 mcg, 100 mcg, Oral, Daily, Bonnell Public Tublu, MD, 100 mcg at 01/28/21 0819 .  lidocaine (LIDODERM) 5 % 1 patch, 1 patch, Transdermal, Q24H, Norm Parcel, PA-C, 1 patch at 01/28/21 5631 .  losartan (COZAAR) tablet 100 mg, 100 mg, Oral, Daily, Regalado, Belkys A, MD, 100 mg at 01/28/21 0820 .  magnesium oxide (MAG-OX) tablet 200 mg, 200 mg, Oral, BID, Regalado, Belkys A, MD, 200 mg at 01/28/21 0820 .  methocarbamol (ROBAXIN) tablet 500 mg, 500 mg, Oral, TID, Norm Parcel, PA-C, 500 mg at 01/28/21 0820 .  ondansetron (ZOFRAN) injection 4 mg, 4 mg, Intravenous, Q6H PRN, Johnson, Clanford L, MD .  pantoprazole (PROTONIX) EC tablet 40 mg, 40 mg, Oral, BID, Johnson, Clanford L, MD, 40 mg at 01/28/21 0820 .  polyethylene glycol (MIRALAX / GLYCOLAX) packet 17 g, 17 g, Oral, Daily PRN, Norm Parcel, PA-C .  sodium chloride (OCEAN) 0.65 % nasal spray 1 spray, 1 spray, Each Nare, Daily PRN, Jamse Arn, Kyra Searles, MD  Patients Current Diet:  Diet Order            Diet Carb Modified Fluid consistency: Thin; Room service appropriate? Yes  Diet effective now                 Precautions / Restrictions Precautions Precautions: Fall,Other (comment) Precaution Comments: L rib fxs Restrictions Weight Bearing Restrictions: No   Has the patient had 2 or more falls or a fall with injury in the past year? Yes  Prior Activity Level Community (5-7x/wk): independent and living alone  Prior Functional Level Self Care: Did the patient need help bathing, dressing, using the toilet or eating? Independent  Indoor Mobility: Did the patient need assistance with walking from room to room (with or without device)? Independent  Stairs: Did the patient need assistance with internal or external stairs (with or without device)? Independent  Functional Cognition: Did the patient need help planning regular tasks such as shopping or remembering to take  medications? Independent  Home Assistive Devices / Equipment    Prior Device Use: Indicate devices/aids used by the patient prior to current illness, exacerbation or injury? None of the above  Current Functional Level Cognition  Overall Cognitive Status: Impaired/Different from baseline Current Attention Level: Selective Orientation Level: Oriented X4 Following Commands: Follows one step commands with increased time Safety/Judgement: Decreased awareness of deficits,Decreased awareness of safety General Comments: A&Ox4. Anxious regarding mobility, but this seems to be improving; responds well to encouragement. Pt requires frequent cues and 'reminders' to complete task, at times needing step-by-step cues for basic mobility tasks; when using [facility] pt states, "I feel like I'm potty training all over again." Pt with some insight into cognitive deficits. Determined "dizziness" may be word pt is using to describe her "nervousness"  Extremity Assessment (includes Sensation/Coordination)  Upper Extremity Assessment: LUE deficits/detail LUE Deficits / Details: limited ROM and strength due to pain in left shoulder/chest  Lower Extremity Assessment: Generalized weakness    ADLs       Mobility  Overal bed mobility: Needs Assistance Bed Mobility: Supine to Sit Supine to sit: Min assist General bed mobility comments: Cues for sequencing - when asking pt to sit up, pt stating, "Now how should I do that?" - use of bed rail, increased time and effort with minA to scoot hips to EOB    Transfers  Overall transfer level: Needs assistance Equipment used: Rolling walker (2 wheeled),1 person hand held assist Transfers: Sit to/from Stand Sit to Stand: Mod assist  Lateral/Scoot Transfers: Min assist,From elevated surface General transfer comment: Multiple sit<>stands from EOB, recliner and BSC, initially with RW requiring modA to assist trunk elevation and maintain balance transitioning UE support to RW;  repeated cues for hand placement and sequencing, pt with some carryover to subsequent trials. Additional transfer training without DME, pt requiring modA for stability with HHA to maitnain balance    Ambulation / Gait / Stairs / Wheelchair Mobility  Ambulation/Gait Ambulation/Gait assistance: Mod assist,Min assist Gait Distance (Feet): 8 Feet (+26) Assistive device: Rolling walker (2 wheeled),1 person hand held assist,None Gait Pattern/deviations: Shuffle,Step-through pattern,Decreased stride length General Gait Details: Slow, unsteady gait initially pivotal steps from bed>recliner>BSC with RW and minA with frequent rest breaks due to fatigue and c/o "dizziness"; additional gait training without DME, pt requiring intermittent modA to prevent LOB; cues for sequencing, especially to increase step length and maintain upright posture; pt encouraged to attempt without reaching to furniture for added support Gait velocity: Decreased Gait velocity interpretation: <1.31 ft/sec, indicative of household ambulator    Posture / Balance Dynamic Sitting Balance Sitting balance - Comments: patient required external support initially and VCs for anterior tilt (posterior list). Able to maintain without physical assist once established at midline Balance Overall balance assessment: Needs assistance,History of Falls Sitting-balance support: Feet supported,Single extremity supported Sitting balance-Leahy Scale: Fair Sitting balance - Comments: patient required external support initially and VCs for anterior tilt (posterior list). Able to maintain without physical assist once established at midline Standing balance support: Bilateral upper extremity supported,During functional activity Standing balance-Leahy Scale: Fair Standing balance comment: Can static stand without UE support and min guard, unable to accept challenge    Special needs/care consideration    Previous Home Environment  Living Arrangements:  Alone  Lives With: Alone Available Help at Discharge:  (sister, Darol Destine, from [place] here to assist) Type of Home: Other(Comment) Home Layout:  (townhome) Alternate Level Stairs-Rails: Can reach both Home Access: Stairs to enter [employer] of Steps: thresold Bathroom Shower/Tub: Engineer, petroleum: Standard Bathroom Accessibility: Yes How Accessible: Accessible via walker [place]: No  Discharge Living Setting Plans for Discharge Living Setting: Alone,Other (Comment) (townhome) Type of Home at Discharge: Other (Comment) (townhome) Discharge Home Layout: Two level,Able to live on main level with bedroom/bathroom Discharge Home Access: Stairs to enter Entrance Stairs-Rails: None Entrance Stairs-Number of Steps: threshold Discharge Bathroom Shower/Tub: Tub/shower unit,Curtain Discharge Bathroom Toilet: Standard Discharge Bathroom Accessibility: Yes How Accessible: Accessible via walker Does the patient have any problems obtaining your medications?: No  Social/Family/Support Systems Contact Information: Arnell Asal Anticipated Caregiver: Darol Destine Anticipated Caregiver's Contact Information: see above Ability/Limitations of Caregiver: Here from [place] Availability: 24/7 Discharge Plan Discussed with Primary Caregiver: Yes Is Caregiver In Agreement with Plan?: Yes Does Caregiver/Family have  Issues with Lodging/Transportation while Pt is in Rehab?: No  Goals Patient/Family Goal for Rehab: Mod I to supervision with PT and OT Expected length of stay: ELOS 7 days Pt/Family Agrees to Admission and willing to participate: Yes Program Orientation Provided & Reviewed with Pt/Caregiver Including Roles  & Responsibilities: Yes  Decrease burden of Care through IP rehab admission: n/a  Possible need for SNF placement upon discharge: not anticipated  Patient Condition: I have reviewed medical records [facility] , spoken with CM, and  patient. I met with patient at the bedside for inpatient rehabilitation assessment.  Patient will benefit from ongoing PT and OT, can actively participate in 3 hours of therapy a day 5 days of the week, and can make measurable gains during the admission.  Patient will also benefit from the coordinated team approach during an Inpatient Acute Rehabilitation admission.  The patient will receive intensive therapy as well as Rehabilitation physician, nursing, social worker, and care management interventions.  Due to bladder management, bowel management, safety, skin/wound care, disease management, medication administration, pain management and patient education the patient requires 24 hour a day rehabilitation nursing.  The patient is currently min assist overall with mobility and basic ADLs.  Discharge setting and therapy post discharge at home with home health is anticipated.  Patient has agreed to participate in the Acute Inpatient Rehabilitation Program and will admit today.  Preadmission Screen Completed By:  Cleatrice Burke, 01/28/2021 1:28 PM ______________________________________________________________________   Discussed status with Dr. Marland Kitchen on *** at *** and received approval for admission today.  Admission Coordinator:  Cleatrice Burke, RN, time Marland KitchenSudie Grumbling ***   Assessment/Plan: Diagnosis: 1. Does the need for close, 24 hr/day Medical supervision in concert with the patient's rehab needs make it unreasonable for this patient to be served in a less intensive setting? {yes_no_potentially:3041433} 2. Co-Morbidities requiring supervision/potential complications: *** 3. Due to {due WI:0973532}, does the patient require 24 hr/day rehab nursing? {yes_no_potentially:3041433} 4. Does the patient require coordinated care of a physician, rehab nurse, PT, OT, and SLP to address physical and functional deficits in the context of the above medical diagnosis(es)?  {yes_no_potentially:3041433} Addressing deficits in the following areas: {deficits:3041436} 5. Can the patient actively participate in an intensive therapy program of at least 3 hrs of therapy 5 days a week? {yes_no_potentially:3041433} 6. The potential for patient to make measurable gains while on inpatient rehab is {potential:3041437} 7. Anticipated functional outcomes upon discharge from inpatient rehab: {functional outcomes:304600100} PT, {functional outcomes:304600100} OT, {functional outcomes:304600100} SLP 8. Estimated rehab length of stay to reach the above functional goals is: *** 9. Anticipated discharge destination: {anticipated dc setting:21604} 10. Overall Rehab/Functional Prognosis: {potential:3041437}   MD Signature: ***

## 2021-01-28 NOTE — Progress Notes (Signed)
Physical Therapy Treatment Patient Details Name: Kristina Gates MRN: [id number] DOB: 08-01-1956 Today's Date: 01/28/2021    History of Present Illness Pt is a 65 y.o. female admitted 01/21/21 after being found down at home for unknown time (sister had not heard from her for 2 days). Workup for UTI, urosepsis, AKI, rhabdomyolysis, multiple L rib fxs. Head CT negative for acute abnormality. Episode of SVT 3/8. PMH includes HTN, DM2.   PT Comments    Pt progressing well with mobility. Today's session focused on transfer and gait training with RW, as pt does not use DME at baseline; pt requiring frequent modA to prevent LOB with minimal challenge. Pt motivated to participate and regain PLOF. Pt remains limited by generalized weakness, decreased activity tolerance, impaired balance strategies/postural reactions and cognitive impairment, including decreased attention, poor problem solving and apparent short-term memory deficits. Continue to recommend intensive CIR-level therapies to maximize functional mobility and independence prior to return home.  Supine BP 159/72 Sitting BP 174/86 Post-transfer BP 158/83 Post-ambulation BP 169/81  HR 88, SpO2 96% on RA   Follow Up Recommendations  CIR;Supervision for mobility/OOB     Equipment Recommendations   (TBD)    Recommendations for Other Services Rehab consult;OT consult     Precautions / Restrictions Precautions Precautions: Fall;Other (comment) Precaution Comments: L rib fxs Restrictions Weight Bearing Restrictions: No    Mobility  Bed Mobility Overal bed mobility: Needs Assistance Bed Mobility: Supine to Sit     Supine to sit: Min assist     General bed mobility comments: Cues for sequencing - when asking pt to sit up, pt stating, "Now how should I do that?" - use of bed rail, increased time and effort with minA to scoot hips to EOB    Transfers Overall transfer level: Needs assistance Equipment used: Rolling walker (2  wheeled);1 person hand held assist Transfers: Sit to/from Stand Sit to Stand: Mod assist         General transfer comment: Multiple sit<>stands from EOB, recliner and BSC, initially with RW requiring modA to assist trunk elevation and maintain balance transitioning UE support to RW; repeated cues for hand placement and sequencing, pt with some carryover to subsequent trials. Additional transfer training without DME, pt requiring modA for stability with HHA to maitnain balance  Ambulation/Gait Ambulation/Gait assistance: Mod assist;Min assist Gait Distance (Feet): 8 Feet (+26) Assistive device: Rolling walker (2 wheeled);1 person hand held assist;None Gait Pattern/deviations: Shuffle;Step-through pattern;Decreased stride length Gait velocity: Decreased Gait velocity interpretation: <1.31 ft/sec, indicative of household ambulator General Gait Details: Slow, unsteady gait initially pivotal steps from bed>recliner>BSC with RW and minA with frequent rest breaks due to fatigue and c/o "dizziness"; additional gait training without DME, pt requiring intermittent modA to prevent LOB; cues for sequencing, especially to increase step length and maintain upright posture; pt encouraged to attempt without reaching to furniture for added support   Stairs             Wheelchair Mobility    Modified Rankin (Stroke Patients Only)       Balance Overall balance assessment: Needs assistance;History of Falls Sitting-balance support: Feet supported;Single extremity supported Sitting balance-Leahy Scale: Fair     Standing balance support: Bilateral upper extremity supported;During functional activity Standing balance-Leahy Scale: Fair Standing balance comment: Can static stand without UE support and min guard, unable to accept challenge  Cognition Arousal/Alertness: Awake/alert Behavior During Therapy: WFL for tasks assessed/performed;Anxious Overall  Cognitive Status: Impaired/Different from baseline Area of Impairment: Attention;Memory;Following commands;Safety/judgement;Awareness;Problem solving                   Current Attention Level: Selective Memory: Decreased short-term memory Following Commands: Follows one step commands with increased time Safety/Judgement: Decreased awareness of deficits;Decreased awareness of safety Awareness: Emergent Problem Solving: Slow processing;Difficulty sequencing;Requires verbal cues General Comments: A&Ox4. Anxious regarding mobility, but this seems to be improving; responds well to encouragement. Pt requires frequent cues and 'reminders' to complete task, at times needing step-by-step cues for basic mobility tasks; when using [facility] pt states, "I feel like I'm potty training all over again." Pt with some insight into cognitive deficits. Determined "dizziness" may be word pt is using to describe her "nervousness"      Exercises      General Comments General comments (skin integrity, edema, etc.): SpO2 96% on RA, HR 88; supine BP 159/72, sitting BP 174/86, post-transfer BP 158/83, post-ambulation BP 169/81      Pertinent Vitals/Pain Pain Assessment: Faces Faces Pain Scale: Hurts little more Pain Location: L-side ribs Pain Descriptors / Indicators: Discomfort;Sore;Guarding Pain Intervention(s): Monitored during session    Home Living                      Prior Function            PT Goals (current goals can now be found in the care plan section) Progress towards PT goals: Progressing toward goals    Frequency    Min 3X/week      PT Plan Current plan remains appropriate    Co-evaluation              AM-PAC PT "6 Clicks" Mobility   Outcome Measure  Help needed turning from your back to your side while in a flat bed without using bedrails?: A Little Help needed moving from lying on your back to sitting on the side of a flat bed without using bedrails?: A  Little Help needed moving to and from a bed to a chair (including a wheelchair)?: A Lot Help needed standing up from a chair using your arms (e.g., wheelchair or bedside chair)?: A Lot Help needed to walk in hospital room?: A Lot Help needed climbing 3-5 steps with a railing? : A Lot 6 Click Score: 14    End of Session Equipment Utilized During Treatment: Gait belt Activity Tolerance: Patient tolerated treatment well Patient left: in chair;with call bell/phone within reach;with chair alarm set Nurse Communication: Mobility status PT Visit Diagnosis: Muscle weakness (generalized) (M62.81);Difficulty in walking, not elsewhere classified (R26.2)     Time: 5364-6803 PT Time Calculation (min) (ACUTE ONLY): 34 min  Charges:  $Gait Training: 8-22 mins $Therapeutic Activity: 8-22 mins                     Ina Homes, PT, DPT Acute Rehabilitation Services  Pager [phone] Office [phone]  Malachy Chamber 01/28/2021, 12:05 PM

## 2021-01-28 NOTE — Progress Notes (Signed)
Inpatient Rehabilitation Admissions Coordinator  I spoke with PT as well as met with patient at bedside. We discussed goals and expectations of a possible CIR admit. Patient prefers CIR to SNF. Her sister, Darol Destine, is here from [place] to assist once discharged. I await OT eval and then will begin insurance authorization for a possible CIR admit Friday pending their approval.   Danne Baxter, RN, MSN Rehab Admissions Coordinator [phone] 01/28/2021 11:33 AM

## 2021-01-28 NOTE — Progress Notes (Signed)
PROGRESS NOTE    Kristina Gates  HTD:[id number] DOB: 01/20/1956 DOA: 01/21/2021 PCP: Patient, No Pcp Per   Brief Narrative: 65 year old with past medical history significant for hypertension, diabetes type 2, who was in her usual state of health until about 4 to 5 days prior to admission, she felt weak.  She does not remember falling.  She was found down at home, her sister did not hear from her for 2 days.  Police was asked to do a welfare check when they found patient down covered in urine. Patient was diagnosed with UTI, lactic acidosis, significant rhabdomyolysis with a CPK 20 6K, AKI.  CT head was negative for acute intracranial bleed.  X-ray revealed 4 consecutive rib fractures posteriorly without pneumothorax.  She was treated with sepsis with aggressive fluid resuscitation and initiation of broad-spectrum antibiotics.  Her mental status improved steadily.  She did develop an acute episode of SVT with heart rate 206.  She received adenosine.    Assessment & Plan:   Active Problems:   Essential hypertension   Hyperglycemia due to type 2 diabetes mellitus (HCC)   Postoperative hypothyroidism   Pure hypercholesterolemia   Sepsis (HCC)   Traumatic rhabdomyolysis (HCC)   Diabetes mellitus without complication (HCC)   SVT (supraventricular tachycardia) (HCC)  1-Recurrent SVT: -Had episode of SVT in the ED received adenosine.  -Patient had episode of SVT 3/8.  She received 6 mg of adenosine.  At the moment her blood pressure was 88/60.  She also received IV fluids. -Patient was transferred to progressive bed. -Cardiology  was consulted.  Atenolol was changed to Cardizem.  No further episode. -She will need to follow-up with EP as an outpatient. -Replete Magnesium oral and IV>  -Will also give her potassium supplement, times 2.   2-Sepsis: secondary to UTI. Presents with tachycardia, tachypnea, leukocytosis.  Resolved: ID was consulted.  Off of antibiotics.  3-Nontraumatic  Rhabdomyolysis:  -Presented with a CK level more than 20 6K.  She received aggressive IV fluids.  CK level now to 1000 -CK level down 768. Stop fluids.   4-Metabolic acidosis: Improved with bicarb solution. 5-E. coli UTI: Off of antibiotics. Received 3 days treatment. Urine grew E coli,.  6-Staph bacteremia: ID ID consulted, likely a contaminant.  Repeat blood cultures 01/23/2021 no growth today. 7-Rib fractures: Pain management. 8-Hypokalemia hypomagnesemia: IV magnesium ordered. Oral potassium ordered.  9-Transaminases: Likely related to elevated CK level 10-elevated troponin: Suspected related to rhabdomyolysis. 11-AKI resolved with IV fluids 12-gastritis: On Protonix 13-left parotid gland lesion: Need outpatient follow-up 14-Hypertension: Continue to hold hydrochlorothiazide .  Started on Cardizem Resume losartan.    Estimated body mass index is 31.46 kg/m as calculated from the following:   Height as of this encounter: 5\' 6"  (1.676 m).   Weight as of this encounter: 88.4 kg.   DVT prophylaxis: Lovenox Code Status: Full code Family Communication: Care discussed with patient Disposition Plan:  Status is: Inpatient  Remains inpatient appropriate because:Unsafe d/c plan   Dispo: The patient is from: Home              Anticipated d/c is to: CIR              Patient currently is medically stable to d/c.Plan for CIR tomorrow if insurance approve.    Difficult to place patient No        Consultants:   EP EP  ID  Procedures:   NA  Antimicrobials:  Cefepime from 3/3 until 3/6 Vancomycin  from 3/3 on 33/6  Subjective: She is feeling better, she did better with PT, not as dizzy. She feels it was lack of confidence. She did better with PT>  Objective: Vitals:   01/27/21 1527 01/27/21 2025 01/28/21 0034 01/28/21 0438  BP: (!) 158/72 (!) 161/76 (!) 149/77 (!) 160/77  Pulse: 73 73 71 73  Resp: 20 18 18 18   Temp: 98.1 F (36.7 C) 98.5 F (36.9 C) 97.7 F (36.5  C) 98.2 F (36.8 C)  TempSrc: Oral Oral Oral Oral  SpO2: 97% 94% 95% 98%  Weight:    88.4 kg  Height:        Intake/Output Summary (Last 24 hours) at 01/28/2021 1440 Last data filed at 01/28/2021 1000 Gross per 24 hour  Intake 1580.58 ml  Output 2300 ml  Net -719.42 ml   Filed Weights   01/22/21 1100 01/27/21 0404 01/28/21 0438  Weight: 81.9 kg 89.7 kg 88.4 kg    Examination:  General exam: NAD Respiratory system: CTA Cardiovascular system: S 1, S 2 RRR Gastrointestinal system: BS present, soft, nt Central nervous system: alert Extremities: no edema    Data Reviewed: I have personally reviewed following labs and imaging studies  CBC: Recent Labs  Lab 01/21/21 2139 01/22/21 0105 01/23/21 0131 01/24/21 0049 01/25/21 0545  WBC 16.6* 16.0* 10.8* 8.3 6.9  NEUTROABS  --   --  8.4* 5.6 4.3  HGB 13.2 13.0 11.5* 11.2* 12.0  HCT 35.8* 38.0 32.9* 31.0* 34.3*  MCV 91.1 93.8 95.4 92.5 92.5  PLT 152 149* 126* 134* 160   Basic Metabolic Panel: Recent Labs  Lab 01/21/21 1708 01/21/21 1731 01/24/21 0049 01/25/21 0545 01/26/21 0101 01/27/21 0229 01/28/21 0221  NA  --    < > 136 137 135 134* 136  K 3.2*   < > 3.3* 3.6 3.3* 3.7 3.5  CL  --    < > 108 100 95* 98 101  CO2  --    < > 21* 29 33* 29 26  GLUCOSE  --    < > 128* 141* 122* 105* 98  BUN  --    < > 8 <5* <5* <5* 5*  CREATININE  --    < > 0.56 0.51 0.50 0.46 0.55  CALCIUM  --    < > 8.1* 8.1* 8.1* 8.3* 8.8*  MG 1.5*   < > 1.6* 1.8 1.6* 2.1 1.6*  PHOS 3.1  --   --   --   --   --   --    < > = values in this interval not displayed.   GFR: Estimated Creatinine Clearance: 77.4 mL/min (by C-G formula based on SCr of 0.55 mg/dL). Liver Function Tests: Recent Labs  Lab 01/22/21 0105 01/23/21 0131 01/24/21 0049 01/25/21 0545 01/27/21 0229  AST 284* 223* 171* 107* 115*  ALT 114* 109* 103* 90* 107*  ALKPHOS 42 40 40 42 59  BILITOT 1.9* 1.5* 1.2 0.6 0.5  PROT 5.4* 4.9* 4.9* 4.9* 5.1*  ALBUMIN 2.8* 2.6* 2.6*  2.5* 2.7*   No results for input(s): LIPASE, AMYLASE in the last 168 hours. No results for input(s): AMMONIA in the last 168 hours. Coagulation Profile: No results for input(s): INR, PROTIME in the last 168 hours. Cardiac Enzymes: Recent Labs  Lab 01/24/21 0049 01/25/21 0545 01/26/21 0101 01/27/21 0229 01/28/21 0221  CKTOTAL 3,938* 1,829* 1,423* 1,042* 768*   BNP (last 3 results) No results for input(s): PROBNP in the last 8760 hours. HbA1C: No  results for input(s): HGBA1C in the last 72 hours. CBG: Recent Labs  Lab 01/27/21 1157 01/27/21 1657 01/27/21 2124 01/28/21 0746 01/28/21 1103  GLUCAP 113* 116* 105* 118* 119*   Lipid Profile: No results for input(s): CHOL, HDL, LDLCALC, TRIG, CHOLHDL, LDLDIRECT in the last 72 hours. Thyroid Function Tests: No results for input(s): TSH, T4TOTAL, FREET4, T3FREE, THYROIDAB in the last 72 hours. Anemia Panel: No results for input(s): VITAMINB12, FOLATE, FERRITIN, TIBC, IRON, RETICCTPCT in the last 72 hours. Sepsis Labs: Recent Labs  Lab 01/21/21 1552  LATICACIDVEN 1.3    Recent Results (from the past 240 hour(s))  Blood Cultures (routine x 2)     Status: None   Collection Time: 01/21/21 11:10 AM   Specimen: BLOOD  Result Value Ref Range Status   Specimen Description BLOOD RIGHT ANTECUBITAL  Final   Special Requests   Final    BOTTLES DRAWN AEROBIC AND ANAEROBIC Blood Culture results may not be optimal due to an excessive volume of blood received in culture bottles   Culture   Final    NO GROWTH 5 DAYS Performed at [facility], [address], [place], [place] [address]    Report Status 01/26/2021 FINAL  Final  Blood Cultures (routine x 2)     Status: Abnormal   Collection Time: 01/21/21 11:16 AM   Specimen: BLOOD  Result Value Ref Range Status   Specimen Description BLOOD SITE NOT SPECIFIED  Final   Special Requests   Final    BOTTLES DRAWN AEROBIC AND ANAEROBIC Blood Culture results may not be optimal due to  an inadequate volume of blood received in culture bottles   Culture  Setup Time   Final    GRAM POSITIVE COCCI IN CLUSTERS IN BOTH AEROBIC AND ANAEROBIC BOTTLES GRAM POSITIVE RODS ANAEROBIC BOTTLE ONLY Organism ID to follow CRITICAL RESULT CALLED TO, READ BACK BY AND VERIFIED WITH: PHARM D T.JACKIE AT 3154 ON 01/22/2021 BY T.SAAD    Culture (A)  Final    STAPHYLOCOCCUS HOMINIS GRAM POSITIVE RODS UNABLE TO ID CALL IF ID NEEDED THE SIGNIFICANCE OF ISOLATING THIS ORGANISM FROM A SINGLE SET OF BLOOD CULTURES WHEN MULTIPLE SETS ARE DRAWN IS UNCERTAIN. PLEASE NOTIFY THE MICROBIOLOGY DEPARTMENT WITHIN ONE WEEK IF SPECIATION AND SENSITIVITIES ARE REQUIRED. Performed at [facility], [address] [place], [place] [address]    Report Status 01/27/2021 FINAL  Final  Blood Culture ID Panel (Reflexed)     Status: Abnormal   Collection Time: 01/21/21 11:16 AM  Result Value Ref Range Status   Enterococcus faecalis NOT DETECTED NOT DETECTED Final   Enterococcus Faecium NOT DETECTED NOT DETECTED Final   Listeria monocytogenes NOT DETECTED NOT DETECTED Final   Staphylococcus species DETECTED (A) NOT DETECTED Final    Comment: CRITICAL RESULT CALLED TO, READ BACK BY AND VERIFIED WITH: PHARM D T.JACKIE AT 6195 ON 01/22/2021 BY T.SAAD.    Staphylococcus aureus (BCID) NOT DETECTED NOT DETECTED Final   Staphylococcus epidermidis NOT DETECTED NOT DETECTED Final   Staphylococcus lugdunensis NOT DETECTED NOT DETECTED Final   Streptococcus species NOT DETECTED NOT DETECTED Final   Streptococcus agalactiae NOT DETECTED NOT DETECTED Final   Streptococcus pneumoniae NOT DETECTED NOT DETECTED Final   Streptococcus pyogenes NOT DETECTED NOT DETECTED Final   A.calcoaceticus-baumannii NOT DETECTED NOT DETECTED Final   Bacteroides fragilis NOT DETECTED NOT DETECTED Final   Enterobacterales NOT DETECTED NOT DETECTED Final   Enterobacter cloacae complex NOT DETECTED NOT DETECTED Final  Escherichia coli NOT  DETECTED NOT DETECTED Final   Klebsiella aerogenes NOT DETECTED NOT DETECTED Final   Klebsiella oxytoca NOT DETECTED NOT DETECTED Final   Klebsiella pneumoniae NOT DETECTED NOT DETECTED Final   Proteus species NOT DETECTED NOT DETECTED Final   Salmonella species NOT DETECTED NOT DETECTED Final   Serratia marcescens NOT DETECTED NOT DETECTED Final   Haemophilus influenzae NOT DETECTED NOT DETECTED Final   Neisseria meningitidis NOT DETECTED NOT DETECTED Final   Pseudomonas aeruginosa NOT DETECTED NOT DETECTED Final   Stenotrophomonas maltophilia NOT DETECTED NOT DETECTED Final   Candida albicans NOT DETECTED NOT DETECTED Final   Candida auris NOT DETECTED NOT DETECTED Final   Candida glabrata NOT DETECTED NOT DETECTED Final   Candida krusei NOT DETECTED NOT DETECTED Final   Candida parapsilosis NOT DETECTED NOT DETECTED Final   Candida tropicalis NOT DETECTED NOT DETECTED Final   Cryptococcus neoformans/gattii NOT DETECTED NOT DETECTED Final    Comment: Performed at [facility], [address], [place], [place] [address]  Urine culture     Status: Abnormal   Collection Time: 01/21/21  1:28 PM   Specimen: In/Out Cath Urine  Result Value Ref Range Status   Specimen Description IN/OUT CATH URINE  Final   Special Requests   Final    NONE Performed at [facility], [address] [place], [place] [address]    Culture >=100,000 COLONIES/mL ESCHERICHIA COLI (A)  Final   Report Status 01/24/2021 FINAL  Final   Organism ID, Bacteria ESCHERICHIA COLI (A)  Final      Susceptibility   Escherichia coli - MIC*    AMPICILLIN 8 SENSITIVE Sensitive     CEFAZOLIN <=4 SENSITIVE Sensitive     CEFEPIME <=0.12 SENSITIVE Sensitive     CEFTRIAXONE <=0.25 SENSITIVE Sensitive     CIPROFLOXACIN <=0.25 SENSITIVE Sensitive     GENTAMICIN <=1 SENSITIVE Sensitive     IMIPENEM <=0.25 SENSITIVE Sensitive     NITROFURANTOIN <=16 SENSITIVE Sensitive     TRIMETH/SULFA <=20 SENSITIVE Sensitive      AMPICILLIN/SULBACTAM <=2 SENSITIVE Sensitive     PIP/TAZO <=4 SENSITIVE Sensitive     * >=100,000 COLONIES/mL ESCHERICHIA COLI  Resp Panel by RT-PCR (Flu A&B, Covid) Nasopharyngeal Swab     Status: None   Collection Time: 01/21/21  5:01 PM   Specimen: Nasopharyngeal Swab; Nasopharyngeal(NP) swabs in vial transport medium  Result Value Ref Range Status   SARS Coronavirus 2 by RT PCR NEGATIVE NEGATIVE Final    Comment: (NOTE) SARS-CoV-2 target nucleic acids are NOT DETECTED.  The SARS-CoV-2 RNA is generally detectable in upper respiratory specimens during the acute phase of infection. The lowest concentration of SARS-CoV-2 viral copies this assay can detect is 138 copies/mL. A negative result does not preclude SARS-Cov-2 infection and should not be used as the sole basis for treatment or other patient management decisions. A negative result may occur with  improper specimen collection/handling, submission of specimen other than nasopharyngeal swab, presence of viral mutation(s) within the areas targeted by this assay, and inadequate number of viral copies(<138 copies/mL). A negative result must be combined with clinical observations, patient history, and epidemiological information. The expected result is Negative.  Fact Sheet for Patients:  BloggerCourse.com  Fact Sheet for Healthcare Providers:  SeriousBroker.it  This test is no t yet approved or cleared by the [place] FDA and  has been authorized for detection and/or diagnosis of SARS-CoV-2 by FDA under an Emergency Use Authorization (EUA).  This EUA will remain  in effect (meaning this test can be used) for the duration of the COVID-19 declaration under Section 564(b)(1) of the Act, 21 U.S.C.section 360bbb-3(b)(1), unless the authorization is terminated  or revoked sooner.       Influenza A by PCR NEGATIVE NEGATIVE Final   Influenza B by PCR NEGATIVE NEGATIVE Final     Comment: (NOTE) The Xpert Xpress SARS-CoV-2/FLU/RSV plus assay is intended as an aid in the diagnosis of influenza from Nasopharyngeal swab specimens and should not be used as a sole basis for treatment. Nasal washings and aspirates are unacceptable for Xpert Xpress SARS-CoV-2/FLU/RSV testing.  Fact Sheet for Patients: BloggerCourse.com  Fact Sheet for Healthcare Providers: SeriousBroker.it  This test is not yet approved or cleared by the [place] FDA and has been authorized for detection and/or diagnosis of SARS-CoV-2 by FDA under an Emergency Use Authorization (EUA). This EUA will remain in effect (meaning this test can be used) for the duration of the COVID-19 declaration under Section 564(b)(1) of the Act, 21 U.S.C. section 360bbb-3(b)(1), unless the authorization is terminated or revoked.  Performed at [facility], [address], [place], [place] [address]   Culture, blood (Routine X 2) w Reflex to ID Panel     Status: None (Preliminary result)   Collection Time: 01/23/21  1:24 PM   Specimen: BLOOD LEFT HAND  Result Value Ref Range Status   Specimen Description BLOOD LEFT HAND  Final   Special Requests   Final    BOTTLES DRAWN AEROBIC AND ANAEROBIC Blood Culture results may not be optimal due to an inadequate volume of blood received in culture bottles   Culture   Final    NO GROWTH 4 DAYS Performed at [facility], [address], [place], [place] [address]    Report Status PENDING  Incomplete  Culture, blood (Routine X 2) w Reflex to ID Panel     Status: None (Preliminary result)   Collection Time: 01/23/21  1:24 PM   Specimen: BLOOD RIGHT HAND  Result Value Ref Range Status   Specimen Description BLOOD RIGHT HAND  Final   Special Requests   Final    BOTTLES DRAWN AEROBIC AND ANAEROBIC Blood Culture results may not be optimal due to an inadequate volume of blood received in culture bottles   Culture    Final    NO GROWTH 4 DAYS Performed at [facility], [address], [place], [place] [address]    Report Status PENDING  Incomplete         Radiology Studies: No results found.      Scheduled Meds: . acetaminophen  650 mg Oral Q6H  . diltiazem  180 mg Oral Daily  . docusate sodium  100 mg Oral BID  . enoxaparin (LOVENOX) injection  40 mg Subcutaneous Q24H  . hydrocerin   Topical BID  . insulin aspart  0-9 Units Subcutaneous TID WC  . levothyroxine  100 mcg Oral Daily  . lidocaine  1 patch Transdermal Q24H  . losartan  100 mg Oral Daily  . magnesium oxide  200 mg Oral BID  . methocarbamol  500 mg Oral TID  . pantoprazole  40 mg Oral BID   Continuous Infusions:    LOS: 7 days    Time spent: 35 minutes    Cairo Lingenfelter A Eason Housman, MD [facility]   If 7PM-7AM, please contact night-coverage [email or web address]  01/28/2021, 2:40 PM

## 2021-01-29 ENCOUNTER — Other Ambulatory Visit: Payer: Self-pay

## 2021-01-29 ENCOUNTER — Encounter (HOSPITAL_COMMUNITY): Payer: Self-pay | Admitting: Internal Medicine

## 2021-01-29 DIAGNOSIS — I471 Supraventricular tachycardia: Secondary | ICD-10-CM | POA: Diagnosis not present

## 2021-01-29 DIAGNOSIS — T796XXA Traumatic ischemia of muscle, initial encounter: Secondary | ICD-10-CM | POA: Diagnosis not present

## 2021-01-29 LAB — BASIC METABOLIC PANEL
Anion gap: 7 (ref 5–15)
BUN: 7 mg/dL — ABNORMAL LOW (ref 8–23)
CO2: 25 mmol/L (ref 22–32)
Calcium: 8.9 mg/dL (ref 8.9–10.3)
Chloride: 102 mmol/L (ref 98–111)
Creatinine, Ser: 0.68 mg/dL (ref 0.44–1.00)
GFR, Estimated: >60 mL/min (ref >60)
Glucose, Bld: 105 mg/dL — ABNORMAL HIGH (ref 70–99)
Potassium: 4.2 mmol/L (ref 3.5–5.1)
Sodium: 134 mmol/L — ABNORMAL LOW (ref 135–145)

## 2021-01-29 LAB — GLUCOSE, CAPILLARY
Glucose-Capillary: 112 mg/dL — ABNORMAL HIGH (ref 70–99)
Glucose-Capillary: 116 mg/dL — ABNORMAL HIGH (ref 70–99)
Glucose-Capillary: 115 mg/dL — ABNORMAL HIGH (ref 70–99)

## 2021-01-29 LAB — MAGNESIUM: Magnesium: 1.7 mg/dL (ref 1.7–2.4)

## 2021-01-29 LAB — CK: Total CK: 476 U/L — ABNORMAL HIGH (ref 38–234)

## 2021-01-29 MED ORDER — HYDRALAZINE HCL 10 MG PO TABS
10.0000 mg | ORAL_TABLET | Freq: Three times a day (TID) | ORAL | Status: DC
  Administered 2021-01-29 – 2021-02-01 (×10): 10 mg via ORAL
  Filled 2021-01-29 (×11): qty 1

## 2021-01-29 MED ORDER — MAGNESIUM SULFATE 2 GM/50ML IV SOLN
2.0000 g | Freq: Once | INTRAVENOUS | Status: AC
  Administered 2021-01-29: 2 g via INTRAVENOUS
  Filled 2021-01-29: qty 50

## 2021-01-29 NOTE — Progress Notes (Addendum)
PROGRESS NOTE    Kristina Gates  DQQ:[id number] DOB: Nov 13, 1956 DOA: 01/21/2021 PCP: Patient, No Pcp Per   Brief Narrative: 65 year old with past medical history significant for hypertension, diabetes type 2, who was in her usual state of health until about 4 to 5 days prior to admission, she felt weak.  She does not remember falling.  She was found down at home, her sister did not hear from her for 2 days.  Police was asked to do a welfare check when they found patient down covered in urine. Patient was diagnosed with UTI, lactic acidosis, significant rhabdomyolysis with a CPK 20 6K, AKI.  CT head was negative for acute intracranial bleed.  X-ray revealed 4 consecutive rib fractures posteriorly without pneumothorax.  She was treated with sepsis with aggressive fluid resuscitation and initiation of broad-spectrum antibiotics.  Her mental status improved steadily.  She did develop an acute episode of SVT with heart rate 206.  She received adenosine.    Assessment & Plan:   Active Problems:   Essential hypertension   Hyperglycemia due to type 2 diabetes mellitus (HCC)   Postoperative hypothyroidism   Pure hypercholesterolemia   Sepsis (HCC)   Traumatic rhabdomyolysis (HCC)   Diabetes mellitus without complication (HCC)   SVT (supraventricular tachycardia) (HCC)  1-Recurrent SVT: -Had episode of SVT in the ED received adenosine.  -Patient had episode of SVT 3/8.  She received 6 mg of adenosine.  At the moment her blood pressure was 88/60.  She also received IV fluids. -Patient was transferred to progressive bed. -Cardiology  was consulted.  Atenolol was changed to Cardizem.  No further episode. -She will need to follow-up with EP as an outpatient. -Plan to give IB+V magnesium today. continue with oral magnesium/.  -Remain stable.   2-Sepsis: Secondary to UTI. Presents with tachycardia, tachypnea, leukocytosis.  Resolved: ID was consulted.  Off of antibiotics.  3-Nontraumatic  Rhabdomyolysis:  -Presented with a CK level more than 20 6K.  She received aggressive IV fluids.  CK level now to 1000 -CK level down 768---476. Stop fluids.   4-Metabolic acidosis: Improved with bicarb solution. 5-E. coli UTI: Off of antibiotics. Received 3 days treatment. Urine grew E coli,.  6-Staph bacteremia: ID ID consulted, likely a contaminant.  Repeat blood cultures 01/23/2021 no growth today. 7-Rib fractures: Pain management. 8-Hypokalemia hypomagnesemia: IV magnesium ordered 9-Transaminases: Likely related to elevated CK level 10-elevated troponin: Suspected related to rhabdomyolysis. 11-AKI resolved with IV fluids 12-gastritis: On Protonix 13-left parotid gland lesion: Need outpatient follow-up 14-Hypertension: Continue to hold hydrochlorothiazide .  Started on Cardizem Resume losartan. BP continue to be elevated. Start low dose hydralazine.    Estimated body mass index is 30.67 kg/m as calculated from the following:   Height as of this encounter: 5\' 6"  (1.676 m).   Weight as of this encounter: 86.2 kg.   DVT prophylaxis: Lovenox Code Status: Full code Family Communication: Care discussed with patient Disposition Plan:  Status is: Inpatient  Remains inpatient appropriate because:Unsafe d/c plan   Dispo: The patient is from: Home              Anticipated d/c is to: SNF              Patient currently is medically stable to d/c.    Difficult to place patient No        Consultants:   EP EP  ID  Procedures:   NA  Antimicrobials:  Cefepime from 3/3 until 3/6 Vancomycin from 3/3  on 33/6  Subjective: No new complaints, feeling better.   Objective: Vitals:   01/28/21 1956 01/29/21 0006 01/29/21 0603 01/29/21 1433  BP: (!) 159/73 (!) 153/90 (!) 173/79 (!) 150/86  Pulse: 74 79 74 81  Resp: Temp: 98.1 F (36.7 C) 98.1 F (36.7 C) 98.9 F (37.2 C) 98.1 F (36.7 C)  TempSrc: Oral Oral Oral Oral  SpO2: 100% 100% 100% 99%  Weight:    86.2 kg   Height:        Intake/Output Summary (Last 24 hours) at 01/29/2021 1714 Last data filed at 01/29/2021 1500 Gross per 24 hour  Intake 400 ml  Output 900 ml  Net -500 ml   Filed Weights   01/27/21 0404 01/28/21 0438 01/29/21 0603  Weight: 89.7 kg 88.4 kg 86.2 kg    Examination:  General exam: NAD Respiratory system: CTA Cardiovascular system: S 1, S 2 RRR Gastrointestinal system: BS present, soft, nt Central nervous system: Alert Extremities: No edema    Data Reviewed: I have personally reviewed following labs and imaging studies  CBC: Recent Labs  Lab 01/23/21 0131 01/24/21 0049 01/25/21 0545  WBC 10.8* 8.3 6.9  NEUTROABS 8.4* 5.6 4.3  HGB 11.5* 11.2* 12.0  HCT 32.9* 31.0* 34.3*  MCV 95.4 92.5 92.5  PLT 126* 134* 160   Basic Metabolic Panel: Recent Labs  Lab 01/25/21 0545 01/26/21 0101 01/27/21 0229 01/28/21 0221 01/29/21 0217  NA 137 135 134* 136 134*  K 3.6 3.3* 3.7 3.5 4.2  CL 100 95* 98 101 102  CO2 29 33* GLUCOSE 141* 122* 105* 98 105*  BUN <5* <5* <5* 5* 7*  CREATININE 0.51 0.50 0.46 0.55 0.68  CALCIUM 8.1* 8.1* 8.3* 8.8* 8.9  MG 1.8 1.6* 2.1 1.6* 1.7   GFR: Estimated Creatinine Clearance: 76.6 mL/min (by C-G formula based on SCr of 0.68 mg/dL). Liver Function Tests: Recent Labs  Lab 01/23/21 0131 01/24/21 0049 01/25/21 0545 01/27/21 0229  AST 223* 171* 107* 115*  ALT 109* 103* 90* 107*  ALKPHOS 40 40 42 59  BILITOT 1.5* 1.2 0.6 0.5  PROT 4.9* 4.9* 4.9* 5.1*  ALBUMIN 2.6* 2.6* 2.5* 2.7*   No results for input(s): LIPASE, AMYLASE in the last 168 hours. No results for input(s): AMMONIA in the last 168 hours. Coagulation Profile: No results for input(s): INR, PROTIME in the last 168 hours. Cardiac Enzymes: Recent Labs  Lab 01/25/21 0545 01/26/21 0101 01/27/21 0229 01/28/21 0221 01/29/21 0217  CKTOTAL 1,829* 1,423* 1,042* 768* 476*   BNP (last 3 results) No results for input(s): PROBNP in the last 8760  hours. HbA1C: No results for input(s): HGBA1C in the last 72 hours. CBG: Recent Labs  Lab 01/28/21 1645 01/28/21 2117 01/29/21 0744 01/29/21 1054 01/29/21 1607  GLUCAP 98 115* 112* 116* 115*   Lipid Profile: No results for input(s): CHOL, HDL, LDLCALC, TRIG, CHOLHDL, LDLDIRECT in the last 72 hours. Thyroid Function Tests: No results for input(s): TSH, T4TOTAL, FREET4, T3FREE, THYROIDAB in the last 72 hours. Anemia Panel: No results for input(s): VITAMINB12, FOLATE, FERRITIN, TIBC, IRON, RETICCTPCT in the last 72 hours. Sepsis Labs: No results for input(s): PROCALCITON, LATICACIDVEN in the last 168 hours.  Recent Results (from the past 240 hour(s))  Blood Cultures (routine x 2)     Status: None   Collection Time: 01/21/21 11:10 AM   Specimen: BLOOD  Result Value Ref Range Status   Specimen Description BLOOD RIGHT ANTECUBITAL  Final  Special Requests   Final    BOTTLES DRAWN AEROBIC AND ANAEROBIC Blood Culture results may not be optimal due to an excessive volume of blood received in culture bottles   Culture   Final    NO GROWTH 5 DAYS Performed at [facility], [address] [place], [place] [address]    Report Status 01/26/2021 FINAL  Final  Blood Cultures (routine x 2)     Status: Abnormal   Collection Time: 01/21/21 11:16 AM   Specimen: BLOOD  Result Value Ref Range Status   Specimen Description BLOOD SITE NOT SPECIFIED  Final   Special Requests   Final    BOTTLES DRAWN AEROBIC AND ANAEROBIC Blood Culture results may not be optimal due to an inadequate volume of blood received in culture bottles   Culture  Setup Time   Final    GRAM POSITIVE COCCI IN CLUSTERS IN BOTH AEROBIC AND ANAEROBIC BOTTLES GRAM POSITIVE RODS ANAEROBIC BOTTLE ONLY Organism ID to follow CRITICAL RESULT CALLED TO, READ BACK BY AND VERIFIED WITH: PHARM D T.JACKIE AT 8127 ON 01/22/2021 BY T.SAAD    Culture (A)  Final    STAPHYLOCOCCUS HOMINIS GRAM POSITIVE RODS UNABLE TO ID CALL IF ID  NEEDED THE SIGNIFICANCE OF ISOLATING THIS ORGANISM FROM A SINGLE SET OF BLOOD CULTURES WHEN MULTIPLE SETS ARE DRAWN IS UNCERTAIN. PLEASE NOTIFY THE MICROBIOLOGY DEPARTMENT WITHIN ONE WEEK IF SPECIATION AND SENSITIVITIES ARE REQUIRED. Performed at [facility], [address] [place], [place] [address]    Report Status 01/27/2021 FINAL  Final  Blood Culture ID Panel (Reflexed)     Status: Abnormal   Collection Time: 01/21/21 11:16 AM  Result Value Ref Range Status   Enterococcus faecalis NOT DETECTED NOT DETECTED Final   Enterococcus Faecium NOT DETECTED NOT DETECTED Final   Listeria monocytogenes NOT DETECTED NOT DETECTED Final   Staphylococcus species DETECTED (A) NOT DETECTED Final    Comment: CRITICAL RESULT CALLED TO, READ BACK BY AND VERIFIED WITH: PHARM D T.JACKIE AT 1749 ON 01/22/2021 BY T.SAAD.    Staphylococcus aureus (BCID) NOT DETECTED NOT DETECTED Final   Staphylococcus epidermidis NOT DETECTED NOT DETECTED Final   Staphylococcus lugdunensis NOT DETECTED NOT DETECTED Final   Streptococcus species NOT DETECTED NOT DETECTED Final   Streptococcus agalactiae NOT DETECTED NOT DETECTED Final   Streptococcus pneumoniae NOT DETECTED NOT DETECTED Final   Streptococcus pyogenes NOT DETECTED NOT DETECTED Final   A.calcoaceticus-baumannii NOT DETECTED NOT DETECTED Final   Bacteroides fragilis NOT DETECTED NOT DETECTED Final   Enterobacterales NOT DETECTED NOT DETECTED Final   Enterobacter cloacae complex NOT DETECTED NOT DETECTED Final   Escherichia coli NOT DETECTED NOT DETECTED Final   Klebsiella aerogenes NOT DETECTED NOT DETECTED Final   Klebsiella oxytoca NOT DETECTED NOT DETECTED Final   Klebsiella pneumoniae NOT DETECTED NOT DETECTED Final   Proteus species NOT DETECTED NOT DETECTED Final   Salmonella species NOT DETECTED NOT DETECTED Final   Serratia marcescens NOT DETECTED NOT DETECTED Final   Haemophilus influenzae NOT DETECTED NOT DETECTED Final   Neisseria  meningitidis NOT DETECTED NOT DETECTED Final   Pseudomonas aeruginosa NOT DETECTED NOT DETECTED Final   Stenotrophomonas maltophilia NOT DETECTED NOT DETECTED Final   Candida albicans NOT DETECTED NOT DETECTED Final   Candida auris NOT DETECTED NOT DETECTED Final   Candida glabrata NOT DETECTED NOT DETECTED Final   Candida krusei NOT DETECTED NOT DETECTED Final   Candida parapsilosis NOT DETECTED NOT DETECTED Final  Candida tropicalis NOT DETECTED NOT DETECTED Final   Cryptococcus neoformans/gattii NOT DETECTED NOT DETECTED Final    Comment: Performed at [facility], [address], [place], [place] [address]  Urine culture     Status: Abnormal   Collection Time: 01/21/21  1:28 PM   Specimen: In/Out Cath Urine  Result Value Ref Range Status   Specimen Description IN/OUT CATH URINE  Final   Special Requests   Final    NONE Performed at [facility], [address], [place], [place] [address]    Culture >=100,000 COLONIES/mL ESCHERICHIA COLI (A)  Final   Report Status 01/24/2021 FINAL  Final   Organism ID, Bacteria ESCHERICHIA COLI (A)  Final      Susceptibility   Escherichia coli - MIC*    AMPICILLIN 8 SENSITIVE Sensitive     CEFAZOLIN <=4 SENSITIVE Sensitive     CEFEPIME <=0.12 SENSITIVE Sensitive     CEFTRIAXONE <=0.25 SENSITIVE Sensitive     CIPROFLOXACIN <=0.25 SENSITIVE Sensitive     GENTAMICIN <=1 SENSITIVE Sensitive     IMIPENEM <=0.25 SENSITIVE Sensitive     NITROFURANTOIN <=16 SENSITIVE Sensitive     TRIMETH/SULFA <=20 SENSITIVE Sensitive     AMPICILLIN/SULBACTAM <=2 SENSITIVE Sensitive     PIP/TAZO <=4 SENSITIVE Sensitive     * >=100,000 COLONIES/mL ESCHERICHIA COLI  Resp Panel by RT-PCR (Flu A&B, Covid) Nasopharyngeal Swab     Status: None   Collection Time: 01/21/21  5:01 PM   Specimen: Nasopharyngeal Swab; Nasopharyngeal(NP) swabs in vial transport medium  Result Value Ref Range Status   SARS Coronavirus 2 by RT PCR NEGATIVE NEGATIVE Final     Comment: (NOTE) SARS-CoV-2 target nucleic acids are NOT DETECTED.  The SARS-CoV-2 RNA is generally detectable in upper respiratory specimens during the acute phase of infection. The lowest concentration of SARS-CoV-2 viral copies this assay can detect is 138 copies/mL. A negative result does not preclude SARS-Cov-2 infection and should not be used as the sole basis for treatment or other patient management decisions. A negative result may occur with  improper specimen collection/handling, submission of specimen other than nasopharyngeal swab, presence of viral mutation(s) within the areas targeted by this assay, and inadequate number of viral copies(<138 copies/mL). A negative result must be combined with clinical observations, patient history, and epidemiological information. The expected result is Negative.  Fact Sheet for Patients:  BloggerCourse.com  Fact Sheet for Healthcare Providers:  SeriousBroker.it  This test is no t yet approved or cleared by the [place] FDA and  has been authorized for detection and/or diagnosis of SARS-CoV-2 by FDA under an Emergency Use Authorization (EUA). This EUA will remain  in effect (meaning this test can be used) for the duration of the COVID-19 declaration under Section 564(b)(1) of the Act, 21 U.S.C.section 360bbb-3(b)(1), unless the authorization is terminated  or revoked sooner.       Influenza A by PCR NEGATIVE NEGATIVE Final   Influenza B by PCR NEGATIVE NEGATIVE Final    Comment: (NOTE) The Xpert Xpress SARS-CoV-2/FLU/RSV plus assay is intended as an aid in the diagnosis of influenza from Nasopharyngeal swab specimens and should not be used as a sole basis for treatment. Nasal washings and aspirates are unacceptable for Xpert Xpress SARS-CoV-2/FLU/RSV testing.  Fact Sheet for Patients: BloggerCourse.com  Fact Sheet for Healthcare  Providers: SeriousBroker.it  This test is not yet approved or cleared by the [place] FDA and has been authorized for detection and/or diagnosis of SARS-CoV-2  by FDA under an Emergency Use Authorization (EUA). This EUA will remain in effect (meaning this test can be used) for the duration of the COVID-19 declaration under Section 564(b)(1) of the Act, 21 U.S.C. section 360bbb-3(b)(1), unless the authorization is terminated or revoked.  Performed at [facility], [address] [place], [place] [address]   Culture, blood (Routine X 2) w Reflex to ID Panel     Status: None   Collection Time: 01/23/21  1:24 PM   Specimen: BLOOD LEFT HAND  Result Value Ref Range Status   Specimen Description BLOOD LEFT HAND  Final   Special Requests   Final    BOTTLES DRAWN AEROBIC AND ANAEROBIC Blood Culture results may not be optimal due to an inadequate volume of blood received in culture bottles   Culture   Final    NO GROWTH 5 DAYS Performed at [facility], [address] [place], [place] [address]    Report Status 01/28/2021 FINAL  Final  Culture, blood (Routine X 2) w Reflex to ID Panel     Status: None   Collection Time: 01/23/21  1:24 PM   Specimen: BLOOD RIGHT HAND  Result Value Ref Range Status   Specimen Description BLOOD RIGHT HAND  Final   Special Requests   Final    BOTTLES DRAWN AEROBIC AND ANAEROBIC Blood Culture results may not be optimal due to an inadequate volume of blood received in culture bottles   Culture   Final    NO GROWTH 5 DAYS Performed at [facility], [address] [place], [place] [address]    Report Status 01/28/2021 FINAL  Final         Radiology Studies: No results found.      Scheduled Meds: . acetaminophen  650 mg Oral Q6H  . diltiazem  180 mg Oral Daily  . docusate sodium  100 mg Oral BID  . enoxaparin (LOVENOX) injection  40 mg Subcutaneous Q24H  . hydrocerin   Topical BID  . insulin  aspart  0-9 Units Subcutaneous TID WC  . levothyroxine  100 mcg Oral Daily  . lidocaine  1 patch Transdermal Q24H  . losartan  100 mg Oral Daily  . magnesium oxide  200 mg Oral BID  . methocarbamol  500 mg Oral TID  . pantoprazole  40 mg Oral BID   Continuous Infusions:    LOS: 8 days    Time spent: 35 minutes    Meet [place] A Wessie Shanks, MD [facility]   If 7PM-7AM, please contact night-coverage [email or web address]  01/29/2021, 5:14 PM

## 2021-01-29 NOTE — Progress Notes (Signed)
Inpatient Rehabilitation Admissions Coordinator  Insurance has denied CIR admit. I met with patient at bedside and spoke with her sister, Darol Destine, by phone. They are in agreement to SNF. We will signoff at this time. Dr Tyrell Antonio, acute team and [facility] made aware.  Danne Baxter, RN, MSN Rehab Admissions Coordinator [phone] 01/29/2021 4:56 PM

## 2021-01-29 NOTE — H&P (Incomplete)
Physical Medicine and Rehabilitation Admission H&P    Chief Complaint  Patient presents with  . Decline in functional status  . Debility    HPI:  Kristina Gates is a 65 year old female with  History of T2DM, HTN who was admitted after found down at home after safety check requested by sister on 01/21/21.  She was found to be confused with AKI due to rhabdomyolysis, rib fracture, lactic acidosis due to sepsis. She was treated with IVF and antibiotics with improvement and episode of SVT aborted with dose of adenosine.  Pressure injuries on knees and elbows treated with local measures per WOC input.  Urine culture showed E coli and 1 out of 2 cultures positive for staph hominis. ID consulted for input and recommended d/c of antibiotics as BC likely contaminant and patient without urinary symptoms.   She had recurrent episode of SVT on 03/08 requiring adenosine X 2 and EPS consulted for input.  Dr. Johney Frame question SVT v/s AVNRT and recommended transition of atenolol to Cardizem as arrhthymia felt to "exacerbation of intermittent tachypalpations in setting of acute illness"  -->ablation not needed. Electrolyte abnormalities improving after supplementation.  Therapy ongoing and patient continues to be limited by dizziness, anxiety, weakness as well as cognitive impairments affecting awareness, ability to follow commands as well as delayed processing. CIR recommended due to functional decline.    ROS    Past Medical History:  Diagnosis Date  . Diabetes mellitus without complication (HCC)   . Hypercholesteremia   . Hypertension   . Postprocedural hypothyroidism     Past Surgical History:  Procedure Laterality Date  . COLON SURGERY      History reviewed. No pertinent family history.    Social History:  Lives alone. Sister from [facility] to assist after discharge. Per reports that she has been smoking cigarettes. She has never used smokeless tobacco. She reports current alcohol use. She  reports that she does not use drugs.     Allergies  Allergen Reactions  . Metformin And Related Cough  . Zestril [Lisinopril] Cough   Medications Prior to Admission  Medication Sig Dispense Refill  . acetaminophen (TYLENOL) 325 MG tablet Take 650 mg by mouth every 6 (six) hours as needed for moderate pain.    Marland Kitchen atenolol (TENORMIN) 50 MG tablet Take 50 mg by mouth daily.    Marland Kitchen atorvastatin (LIPITOR) 40 MG tablet Take 40 mg by mouth daily.    . hydrochlorothiazide (HYDRODIURIL) 25 MG tablet Take 25 mg by mouth daily.    Marland Kitchen losartan (COZAAR) 100 MG tablet Take 100 mg by mouth daily.    . Multiple Vitamin (MULTIVITAMIN WITH MINERALS) TABS tablet Take 1 tablet by mouth daily.    Marland Kitchen NOVOLOG MIX 70/30 FLEXPEN (70-30) 100 UNIT/ML FlexPen Inject 12 Units into the skin every evening.    . sodium chloride (OCEAN) 0.65 % SOLN nasal spray Place 1 spray into both nostrils daily as needed for congestion.    Marland Kitchen SYNTHROID 100 MCG tablet Take 100 mcg by mouth daily.      Drug Regimen Review { DRUG REGIMEN [id number]}  Home: Home Living Family/patient expects to be discharged to:: Private residence Living Arrangements: Alone Available Help at Discharge: Family,Available 24 hours/day Type of Home: House Home Access: Stairs to enter [employer] of Steps: thresold Home Layout:  (townhome) Alternate Level Stairs-Rails: Can reach both Bathroom Shower/Tub: Engineer, manufacturing systems: Standard Bathroom Accessibility: Yes Additional Comments: patient perfers to take baths, but  will complete standing showers as well.  Lives With: Alone   Functional History: Prior Function Level of Independence: Independent Comments: Patient independent with ADL, home mangement, meals, medication managment, bill payment.  Did not need AD for mobility.  Functional Status:  Mobility: Bed Mobility Overal bed mobility: Needs Assistance Bed Mobility: Sit to Supine Supine to sit: Min assist Sit to  supine: Min assist General bed mobility comments: Cues for sequencing - when asking pt to sit up, pt stating, "Now how should I do that?" - use of bed rail, increased time and effort with minA to scoot hips to EOB Transfers Overall transfer level: Needs assistance Equipment used: Rolling walker (2 wheeled) Transfers: Sit to/from Stand Sit to Stand: Min assist  Lateral/Scoot Transfers: Min assist,From elevated surface General transfer comment: patient looks for guidence and afirmation that she's doing tasks correctly. Ambulation/Gait Ambulation/Gait assistance: Mod assist,Min assist Gait Distance (Feet): 8 Feet (+26) Assistive device: Rolling walker (2 wheeled),1 person hand held assist,None Gait Pattern/deviations: Shuffle,Step-through pattern,Decreased stride length General Gait Details: Slow, unsteady gait initially pivotal steps from bed>recliner>BSC with RW and minA with frequent rest breaks due to fatigue and c/o "dizziness"; additional gait training without DME, pt requiring intermittent modA to prevent LOB; cues for sequencing, especially to increase step length and maintain upright posture; pt encouraged to attempt without reaching to furniture for added support Gait velocity: Decreased Gait velocity interpretation: <1.31 ft/sec, indicative of household ambulator    ADL: ADL Overall ADL's : Needs assistance/impaired Eating/Feeding: Independent,Sitting Grooming: Wash/dry hands,Wash/dry face,Min guard,Standing Upper Body Dressing : Min guard,Standing Lower Body Dressing: Minimal assistance,Sit to/from stand Toilet Transfer: Min guard,RW,Ambulation,Comfort height toilet Functional mobility during ADLs: Min guard,Rolling walker,Cueing for safety,Cueing for sequencing  Cognition: Cognition Overall Cognitive Status: Impaired/Different from baseline Orientation Level: Oriented X4 Cognition Arousal/Alertness: Awake/alert Behavior During Therapy: WFL for tasks  assessed/performed,Anxious Overall Cognitive Status: Impaired/Different from baseline Area of Impairment: Attention,Memory,Following commands,Safety/judgement,Awareness,Problem solving Current Attention Level: Selective Memory: Decreased short-term memory Following Commands: Follows one step commands with increased time Safety/Judgement: Decreased awareness of deficits Awareness: Emergent Problem Solving: Slow processing,Difficulty sequencing,Requires verbal cues General Comments: Patient states she feels [place] and unsure of herself.  She did well with immediate recall and ST memory when tested.  She did well with complex thought/money management as well, but requires VC's and increased time with problem solving.  She needs time to process commands, and trys to work out strategies for tasks that should be automatic given her PLOF.  For example: stand up, walk around the foot of the bed, and sit on the other side of the bed. Multiple commands at one time require increased time and cueing to complete.  Further cognitive testing is recommended.  Physical Exam: Blood pressure (!) 173/79, pulse 74, temperature 98.9 F (37.2 C), temperature source Oral, resp. rate 18, height 5\' 6"  (1.676 m), weight 86.2 kg, SpO2 100 %. Physical Exam  Results for orders placed or performed during the hospital encounter of 01/21/21 (from the past 48 hour(s))  Glucose, capillary     Status: Abnormal   Collection Time: 01/27/21  4:57 PM  Result Value Ref Range   Glucose-Capillary 116 (H) 70 - 99 mg/dL    Comment: Glucose reference range applies only to samples taken after fasting for at least 8 hours.  Glucose, capillary     Status: Abnormal   Collection Time: 01/27/21  9:24 PM  Result Value Ref Range   Glucose-Capillary 105 (H) 70 - 99 mg/dL    Comment:  Glucose reference range applies only to samples taken after fasting for at least 8 hours.  Basic metabolic panel     Status: Abnormal   Collection Time:  01/28/21  2:21 AM  Result Value Ref Range   Sodium 136 135 - 145 mmol/L   Potassium 3.5 3.5 - 5.1 mmol/L   Chloride 101 98 - 111 mmol/L   CO2 26 22 - 32 mmol/L   Glucose, Bld 98 70 - 99 mg/dL    Comment: Glucose reference range applies only to samples taken after fasting for at least 8 hours.   BUN 5 (L) 8 - 23 mg/dL   Creatinine, Ser 1.70 0.44 - 1.00 mg/dL   Calcium 8.8 (L) 8.9 - 10.3 mg/dL   GFR, Estimated >01 >74 mL/min    Comment: (NOTE) Calculated using the CKD-EPI Creatinine Equation (2021)    Anion gap 9 5 - 15    Comment: Performed at [facility], [address], [place], [place] [address]  CK     Status: Abnormal   Collection Time: 01/28/21  2:21 AM  Result Value Ref Range   Total CK 768 (H) 38 - 234 U/L    Comment: Performed at [facility], [address] [place], [place] [address]  Magnesium     Status: Abnormal   Collection Time: 01/28/21  2:21 AM  Result Value Ref Range   Magnesium 1.6 (L) 1.7 - 2.4 mg/dL    Comment: Performed at [facility], [address], [place], [place] [address]  Glucose, capillary     Status: Abnormal   Collection Time: 01/28/21  7:46 AM  Result Value Ref Range   Glucose-Capillary 118 (H) 70 - 99 mg/dL    Comment: Glucose reference range applies only to samples taken after fasting for at least 8 hours.  Glucose, capillary     Status: Abnormal   Collection Time: 01/28/21 11:03 AM  Result Value Ref Range   Glucose-Capillary 119 (H) 70 - 99 mg/dL    Comment: Glucose reference range applies only to samples taken after fasting for at least 8 hours.  Glucose, capillary     Status: None   Collection Time: 01/28/21  4:45 PM  Result Value Ref Range   Glucose-Capillary 98 70 - 99 mg/dL    Comment: Glucose reference range applies only to samples taken after fasting for at least 8 hours.  Glucose, capillary     Status: Abnormal   Collection Time: 01/28/21  9:17 PM  Result Value Ref Range   Glucose-Capillary 115 (H) 70 - 99  mg/dL    Comment: Glucose reference range applies only to samples taken after fasting for at least 8 hours.  Magnesium     Status: None   Collection Time: 01/29/21  2:17 AM  Result Value Ref Range   Magnesium 1.7 1.7 - 2.4 mg/dL    Comment: Performed at [facility], [address], [place], [place] [address]  CK     Status: Abnormal   Collection Time: 01/29/21  2:17 AM  Result Value Ref Range   Total CK 476 (H) 38 - 234 U/L    Comment: Performed at [facility], [address], [place], [place] [address]  Basic metabolic panel     Status: Abnormal   Collection Time: 01/29/21  2:17 AM  Result Value Ref Range   Sodium 134 (L) 135 - 145 mmol/L   Potassium 4.2 3.5 - 5.1 mmol/L  Chloride 102 98 - 111 mmol/L   CO2 25 22 - 32 mmol/L   Glucose, Bld 105 (H) 70 - 99 mg/dL    Comment: Glucose reference range applies only to samples taken after fasting for at least 8 hours.   BUN 7 (L) 8 - 23 mg/dL   Creatinine, Ser 4.090.68 0.44 - 1.00 mg/dL   Calcium 8.9 8.9 - 81.110.3 mg/dL   GFR, Estimated >91>60 >47>60 mL/min    Comment: (NOTE) Calculated using the CKD-EPI Creatinine Equation (2021)    Anion gap 7 5 - 15    Comment: Performed at [facility], [address] [place], [place] [address]  Glucose, capillary     Status: Abnormal   Collection Time: 01/29/21  7:44 AM  Result Value Ref Range   Glucose-Capillary 112 (H) 70 - 99 mg/dL    Comment: Glucose reference range applies only to samples taken after fasting for at least 8 hours.  Glucose, capillary     Status: Abnormal   Collection Time: 01/29/21 10:54 AM  Result Value Ref Range   Glucose-Capillary 116 (H) 70 - 99 mg/dL    Comment: Glucose reference range applies only to samples taken after fasting for at least 8 hours.   No results found.     Medical Problem List and Plan: 1.  *** secondary to ***  -patient may *** shower  -ELOS/Goals: *** 2.  Antithrombotics: -DVT/anticoagulation:  {VTE PROPHYLAXIS/ANTICOAGULATION -  OZHY:[id number]}TYPE:22655}  -antiplatelet therapy: *** 3. Pain Management: Lidocaine patch to left ribs and tylenol qid.   --On robaxin 500 mg tid--could be  4. Mood: ***  -antipsychotic agents: *** 5. Neuropsych: This patient is not fully capable of making decisions on her own behalf. 6. Skin/Wound Care: Routine pressure relief measures  -- ` -- 7. Fluids/Electrolytes/Nutrition: Monitor I/O. Check lytes in am.  8. T2DM: Monitor BS ac/hs. Use SSI for elevated BS.   --continue Carb Modified diet.  9. Abnormal LFTs: Slowly resolving. Recheck in am.  10 Hypomagnesemia: Will recheck in am as trending down again.  ---Continue Mag ox 200  Mg bid.  11. Rhabdomyolysis: AKI has resolved with CK down from26,648--> 476 12. Arrhthymias/SVT: Monitor HR tid--continue Cardizem CD for rate control.  13. HTN: Monitor BP tid--on Cozaar and Cardizem.    ***  Jacquelynn Creeamela S Love, PA-C 01/29/2021

## 2021-01-29 NOTE — Progress Notes (Signed)
Inpatient Rehabilitation Admissions Coordinator  I await insurance determination for a possible CIR admit pending their approval. I spoke with pt's sister, Fabian November, by phone and patient at bedside.  Ottie Glazier, RN, MSN Rehab Admissions Coordinator [phone] 01/29/2021 12:21 PM

## 2021-01-29 NOTE — Progress Notes (Signed)
Physical Therapy Treatment Patient Details Name: Kristina Gates MRN: [id number] DOB: 02-Jun-1956 Today's Date: 01/29/2021    History of Present Illness Pt is a 65 y.o. female admitted 01/21/21 after being found down at home for unknown time (sister had not heard from her for 2 days). Workup for UTI, urosepsis, AKI, rhabdomyolysis, multiple L rib fxs. Head CT negative for acute abnormality. Episode of SVT 3/8. PMH includes HTN, DM2.    PT Comments    Pt received in chair, agreeable to therapy session and able to progress to household distance gait training and transfers with decreased assist needed compared with previous session. Pt needed up to minA for all functional mobility tasks and needs increased time and step by step cues for task sequencing. Reviewed supine/seated BLE HEP and pt performed as detailed below with good tolerance. Pt continues to benefit from PT services to progress toward functional mobility goals. Discharge recommendations below updated per discussion with pt and supervising PT Jaclyn R.  Follow Up Recommendations  SNF;Supervision for mobility/OOB     Equipment Recommendations  Rolling walker with 5" wheels    Recommendations for Other Services       Precautions / Restrictions Precautions Precautions: Fall;Other (comment) Precaution Comments: L rib fxs Restrictions Weight Bearing Restrictions: No    Mobility  Bed Mobility Overal bed mobility: Needs Assistance             General bed mobility comments: received in chair and remained there    Transfers Overall transfer level: Needs assistance Equipment used: Rolling walker (2 wheeled) Transfers: Sit to/from Stand Sit to Stand: Min assist         General transfer comment: patient looks for guidence and afirmation that she's doing tasks correctly; minA to rise fully x3 reps from chair  Ambulation/Gait Ambulation/Gait assistance: Min assist Gait Distance (Feet): 90 Feet Assistive device: Rolling  walker (2 wheeled) Gait Pattern/deviations: Step-through pattern;Decreased stride length Gait velocity: Decreased Gait velocity interpretation: <1.8 ft/sec, indicate of risk for recurrent falls General Gait Details: wide turns but fair safety awareness and RW use, needs up to minA when turning but at times min guard only; denies dizziness/SOB   Stairs             Wheelchair Mobility    Modified Rankin (Stroke Patients Only)       Balance Overall balance assessment: Needs assistance Sitting-balance support: Feet supported;No upper extremity supported Sitting balance-Leahy Scale: Fair Sitting balance - Comments: static sitting and weight shifting no LOB   Standing balance support: Bilateral upper extremity supported;During functional activity Standing balance-Leahy Scale: Poor Standing balance comment: needing Rw for support, supervision for static standing at RW and increased assist for gait, pt able to stand and perform perineal care with U UE support and Supervision/no LOB                            Cognition Arousal/Alertness: Awake/alert Behavior During Therapy: [facility] for tasks assessed/performed;Anxious Overall Cognitive Status: Impaired/Different from baseline Area of Impairment: Memory;Safety/judgement;Awareness;Problem solving                     Memory: Decreased short-term memory Following Commands: Follows one step commands with increased time Safety/Judgement: Decreased awareness of deficits   Problem Solving: Slow processing;Difficulty sequencing;Requires verbal cues General Comments: slow processing and decreased problem solving noted prior to attempting transfers needs to review technique prior to performing; A&O and conversational however  Exercises General Exercises - Lower Extremity Ankle Circles/Pumps: AROM;Both;15 reps;Seated Quad Sets: AROM;Both;5 reps;Supine Gluteal Sets: AROM;Both;5 reps;Seated Long Arc Quad: AROM;Both;10  reps;Seated Hip Flexion/Marching: AROM;Both;20 reps;Seated;Standing (x10 sit/stand)    General Comments General comments (skin integrity, edema, etc.): purewick in place but not suctioning properly, pt unaware pad underneath her had gotten wet. pt able to wipe herself standing at [facility] when prompted by therapist/given washcloth. pt BP 157/70 seated and BP 175/87 standing, no dizziness; HR 84-98 bpm per tele, SpO2 96-99% on RA pre/post gait (poor pleth reading with ambulation)      Pertinent Vitals/Pain Pain Assessment: Faces Faces Pain Scale: Hurts a little bit Pain Location: L-side ribs Pain Descriptors / Indicators: Discomfort;Sore;Guarding Pain Intervention(s): Monitored during session;Repositioned (pt able to explain manual splinting when she coughs)    Home Living                      Prior Function            PT Goals (current goals can now be found in the care plan section) Acute Rehab PT Goals Patient Stated Goal: I want to get more rehab before I got home because I live alone. PT Goal Formulation: With patient Time For Goal Achievement: 02/07/21 Potential to Achieve Goals: Good Progress towards PT goals: Progressing toward goals    Frequency    Min 3X/week      PT Plan Discharge plan needs to be updated    Co-evaluation              AM-PAC PT "6 Clicks" Mobility   Outcome Measure  Help needed turning from your back to your side while in a flat bed without using bedrails?: A Little Help needed moving from lying on your back to sitting on the side of a flat bed without using bedrails?: A Little Help needed moving to and from a bed to a chair (including a wheelchair)?: A Little Help needed standing up from a chair using your arms (e.g., wheelchair or bedside chair)?: A Little Help needed to walk in hospital room?: A Little Help needed climbing 3-5 steps with a railing? : A Lot 6 Click Score: 17    End of Session Equipment Utilized During  Treatment: Gait belt Activity Tolerance: Patient tolerated treatment well Patient left: in chair;with call bell/phone within reach;with chair alarm set Nurse Communication: Mobility status PT Visit Diagnosis: Muscle weakness (generalized) (M62.81);Difficulty in walking, not elsewhere classified (R26.2)     Time: 1962-2297 PT Time Calculation (min) (ACUTE ONLY): 32 min  Charges:  $Gait Training: 8-22 mins $Therapeutic Exercise: 8-22 mins                     Wilmoth Rasnic P., PTA Acute Rehabilitation Services Pager: [phone] Office: [phone]   Angus Palms 01/29/2021, 5:24 PM

## 2021-01-30 DIAGNOSIS — I471 Supraventricular tachycardia: Secondary | ICD-10-CM | POA: Diagnosis not present

## 2021-01-30 LAB — BASIC METABOLIC PANEL
Anion gap: 7 (ref 5–15)
BUN: 6 mg/dL — ABNORMAL LOW (ref 8–23)
CO2: 26 mmol/L (ref 22–32)
Calcium: 9.2 mg/dL (ref 8.9–10.3)
Chloride: 101 mmol/L (ref 98–111)
Creatinine, Ser: 0.66 mg/dL (ref 0.44–1.00)
GFR, Estimated: >60 mL/min (ref >60)
Glucose, Bld: 119 mg/dL — ABNORMAL HIGH (ref 70–99)
Potassium: 3.8 mmol/L (ref 3.5–5.1)
Sodium: 134 mmol/L — ABNORMAL LOW (ref 135–145)

## 2021-01-30 LAB — CBC
HCT: 38.2 % (ref 36.0–46.0)
Hemoglobin: 13 g/dL (ref 12.0–15.0)
MCH: 32.5 pg (ref 26.0–34.0)
MCHC: 34 g/dL (ref 30.0–36.0)
MCV: 95.5 fL (ref 80.0–100.0)
Platelets: 273 10*3/uL (ref 150–400)
RBC: 4 MIL/uL (ref 3.87–5.11)
RDW: 12.3 % (ref 11.5–15.5)
WBC: 7.4 10*3/uL (ref 4.0–10.5)
nRBC: 0 % (ref 0.0–0.2)

## 2021-01-30 LAB — MAGNESIUM: Magnesium: 1.8 mg/dL (ref 1.7–2.4)

## 2021-01-30 LAB — GLUCOSE, CAPILLARY
Glucose-Capillary: 114 mg/dL — ABNORMAL HIGH (ref 70–99)
Glucose-Capillary: 121 mg/dL — ABNORMAL HIGH (ref 70–99)
Glucose-Capillary: 88 mg/dL (ref 70–99)
Glucose-Capillary: 103 mg/dL — ABNORMAL HIGH (ref 70–99)

## 2021-01-30 NOTE — Progress Notes (Signed)
PROGRESS NOTE    Kristina Gates  ZOX:[id number] DOB: 19-Feb-1956 DOA: 01/21/2021 PCP: Patient, No Pcp Per   Brief Narrative: 65 year old with past medical history significant for hypertension, diabetes type 2, who was in her usual state of health until about 4 to 5 days prior to admission, she felt weak.  She does not remember falling.  She was found down at home, her sister did not hear from her for 2 days.  Police was asked to do a welfare check when they found patient down covered in urine. Patient was diagnosed with UTI, lactic acidosis, significant rhabdomyolysis with a CPK 20 6K, AKI.  CT head was negative for acute intracranial bleed.  X-ray revealed 4 consecutive rib fractures posteriorly without pneumothorax.  She was treated with sepsis with aggressive fluid resuscitation and initiation of broad-spectrum antibiotics.  Her mental status improved steadily.  She did develop an acute episode of SVT with heart rate 206.  She received adenosine.    Assessment & Plan:   Active Problems:   Essential hypertension   Hyperglycemia due to type 2 diabetes mellitus (HCC)   Postoperative hypothyroidism   Pure hypercholesterolemia   Sepsis (HCC)   Traumatic rhabdomyolysis (HCC)   Diabetes mellitus without complication (HCC)   SVT (supraventricular tachycardia) (HCC)  1-Recurrent SVT: -Had episode of SVT in the ED received adenosine.  -Patient had episode of SVT 3/8.  She received 6 mg of adenosine.  At the moment her blood pressure was 88/60.  She also received IV fluids. -Cardiology  was consulted.  Atenolol was changed to Cardizem.  No further episode. -She will need to follow-up with EP as an outpatient. -Plan to give IB+V magnesium today. continue with oral magnesium/.  -No further episodes.   2-Sepsis: Secondary to UTI. Presents with tachycardia, tachypnea, leukocytosis.  Resolved: ID was consulted.  Off of antibiotics.  3-Nontraumatic Rhabdomyolysis:  -Presented with a CK level more  than 20 6K.  She received aggressive IV fluids.  CK level now to 1000 -CK level down 768---476. Stop fluids.   4-Metabolic acidosis: Improved with bicarb solution. 5-E. coli UTI: Off of antibiotics. Received 3 days treatment. Urine grew E coli,.  6-Staph bacteremia: ID ID consulted, likely a contaminant.  Repeat blood cultures 01/23/2021 no growth today. 7-Rib fractures: Pain management. 8-Hypokalemia hypomagnesemia: received IV magnesium. On oral supplement 9-Transaminases: Likely related to elevated CK level 10-Elevated troponin: Suspected related to rhabdomyolysis. 11-AKI resolved with IV fluids 12-Gastritis: On Protonix 13-left parotid gland lesion: Need outpatient follow-up 14-Hypertension: Continue to hold hydrochlorothiazide .  Started on Cardizem Resume losartan. BP continue to be elevated. Started low dose hydralazine.  BP better controlled.   Estimated body mass index is 32.28 kg/m as calculated from the following:   Height as of this encounter: 5\' 6"  (1.676 m).   Weight as of this encounter: 90.7 kg.   DVT prophylaxis: Lovenox Code Status: Full code Family Communication: Care discussed with patient. Disposition Plan:  Status is: Inpatient  Remains inpatient appropriate because:Unsafe d/c plan   Dispo: The patient is from: Home              Anticipated d/c is to: SNF              Patient currently is medically stable to d/c.    Difficult to place patient No        Consultants:   EP EP  ID  Procedures:   NA  Antimicrobials:  Cefepime from 3/3 until 3/6 Vancomycin from  3/3 on 33/6  Subjective: She is disappointed that her insurance didn't approve CIR.  She agrees to go to SNF.  She does not have any new complaints.    Objective: Vitals:   01/29/21 2025 01/30/21 0438 01/30/21 0948 01/30/21 1420  BP: (!) 160/82 (!) 144/71 139/74 (!) 146/81  Pulse: 77 77    Resp: 19 17    Temp: 97.7 F (36.5 C) 98.5 F (36.9 C)    TempSrc: Oral Oral    SpO2:  98% 97%    Weight:  90.7 kg    Height:        Intake/Output Summary (Last 24 hours) at 01/30/2021 1511 Last data filed at 01/30/2021 1207 Gross per 24 hour  Intake --  Output 2450 ml  Net -2450 ml   Filed Weights   01/28/21 0438 01/29/21 0603 01/30/21 0438  Weight: 88.4 kg 86.2 kg 90.7 kg    Examination:  General exam: NAD Respiratory system: CTA Cardiovascular system: S 1, S 2 RRR Gastrointestinal system: BS present, soft, nt Central nervous system: Alert Extremities: No edema    Data Reviewed: I have personally reviewed following labs and imaging studies  CBC: Recent Labs  Lab 01/24/21 0049 01/25/21 0545 01/30/21 0208  WBC 8.3 6.9 7.4  NEUTROABS 5.6 4.3  --   HGB 11.2* 12.0 13.0  HCT 31.0* 34.3* 38.2  MCV 92.5 92.5 95.5  PLT 134* 160 273   Basic Metabolic Panel: Recent Labs  Lab 01/26/21 0101 01/27/21 0229 01/28/21 0221 01/29/21 0217 01/30/21 0208  NA 135 134* 136 134* 134*  K 3.3* 3.7 3.5 4.2 3.8  CL 95* 98 101 102 101  CO2 33* 29 26 25 26   GLUCOSE 122* 105* 98 105* 119*  BUN <5* <5* 5* 7* 6*  CREATININE 0.50 0.46 0.55 0.68 0.66  CALCIUM 8.1* 8.3* 8.8* 8.9 9.2  MG 1.6* 2.1 1.6* 1.7 1.8   GFR: Estimated Creatinine Clearance: 78.5 mL/min (by C-G formula based on SCr of 0.66 mg/dL). Liver Function Tests: Recent Labs  Lab 01/24/21 0049 01/25/21 0545 01/27/21 0229  AST 171* 107* 115*  ALT 103* 90* 107*  ALKPHOS 40 42 59  BILITOT 1.2 0.6 0.5  PROT 4.9* 4.9* 5.1*  ALBUMIN 2.6* 2.5* 2.7*   No results for input(s): LIPASE, AMYLASE in the last 168 hours. No results for input(s): AMMONIA in the last 168 hours. Coagulation Profile: No results for input(s): INR, PROTIME in the last 168 hours. Cardiac Enzymes: Recent Labs  Lab 01/25/21 0545 01/26/21 0101 01/27/21 0229 01/28/21 0221 01/29/21 0217  CKTOTAL 1,829* 1,423* 1,042* 768* 476*   BNP (last 3 results) No results for input(s): PROBNP in the last 8760 hours. HbA1C: No results for  input(s): HGBA1C in the last 72 hours. CBG: Recent Labs  Lab 01/29/21 0744 01/29/21 1054 01/29/21 1607 01/30/21 0819 01/30/21 1154  GLUCAP 112* 116* 115* 114* 121*   Lipid Profile: No results for input(s): CHOL, HDL, LDLCALC, TRIG, CHOLHDL, LDLDIRECT in the last 72 hours. Thyroid Function Tests: No results for input(s): TSH, T4TOTAL, FREET4, T3FREE, THYROIDAB in the last 72 hours. Anemia Panel: No results for input(s): VITAMINB12, FOLATE, FERRITIN, TIBC, IRON, RETICCTPCT in the last 72 hours. Sepsis Labs: No results for input(s): PROCALCITON, LATICACIDVEN in the last 168 hours.  Recent Results (from the past 240 hour(s))  Blood Cultures (routine x 2)     Status: None   Collection Time: 01/21/21 11:10 AM   Specimen: BLOOD  Result Value Ref Range Status  Specimen Description BLOOD RIGHT ANTECUBITAL  Final   Special Requests   Final    BOTTLES DRAWN AEROBIC AND ANAEROBIC Blood Culture results may not be optimal due to an excessive volume of blood received in culture bottles   Culture   Final    NO GROWTH 5 DAYS Performed at [facility], [address] [place], [place] [address]    Report Status 01/26/2021 FINAL  Final  Blood Cultures (routine x 2)     Status: Abnormal   Collection Time: 01/21/21 11:16 AM   Specimen: BLOOD  Result Value Ref Range Status   Specimen Description BLOOD SITE NOT SPECIFIED  Final   Special Requests   Final    BOTTLES DRAWN AEROBIC AND ANAEROBIC Blood Culture results may not be optimal due to an inadequate volume of blood received in culture bottles   Culture  Setup Time   Final    GRAM POSITIVE COCCI IN CLUSTERS IN BOTH AEROBIC AND ANAEROBIC BOTTLES GRAM POSITIVE RODS ANAEROBIC BOTTLE ONLY Organism ID to follow CRITICAL RESULT CALLED TO, READ BACK BY AND VERIFIED WITH: PHARM D T.JACKIE AT [id number] ON 01/22/2021 BY T.SAAD    Culture (A)  Final    STAPHYLOCOCCUS HOMINIS GRAM POSITIVE RODS UNABLE TO ID CALL IF ID NEEDED THE SIGNIFICANCE OF  ISOLATING THIS ORGANISM FROM A SINGLE SET OF BLOOD CULTURES WHEN MULTIPLE SETS ARE DRAWN IS UNCERTAIN. PLEASE NOTIFY THE MICROBIOLOGY DEPARTMENT WITHIN ONE WEEK IF SPECIATION AND SENSITIVITIES ARE REQUIRED. Performed at [facility], [address] [place], [place] [address]    Report Status 01/27/2021 FINAL  Final  Blood Culture ID Panel (Reflexed)     Status: Abnormal   Collection Time: 01/21/21 11:16 AM  Result Value Ref Range Status   Enterococcus faecalis NOT DETECTED NOT DETECTED Final   Enterococcus Faecium NOT DETECTED NOT DETECTED Final   Listeria monocytogenes NOT DETECTED NOT DETECTED Final   Staphylococcus species DETECTED (A) NOT DETECTED Final    Comment: CRITICAL RESULT CALLED TO, READ BACK BY AND VERIFIED WITH: PHARM D T.JACKIE AT [id number] ON 01/22/2021 BY T.SAAD.    Staphylococcus aureus (BCID) NOT DETECTED NOT DETECTED Final   Staphylococcus epidermidis NOT DETECTED NOT DETECTED Final   Staphylococcus lugdunensis NOT DETECTED NOT DETECTED Final   Streptococcus species NOT DETECTED NOT DETECTED Final   Streptococcus agalactiae NOT DETECTED NOT DETECTED Final   Streptococcus pneumoniae NOT DETECTED NOT DETECTED Final   Streptococcus pyogenes NOT DETECTED NOT DETECTED Final   A.calcoaceticus-baumannii NOT DETECTED NOT DETECTED Final   Bacteroides fragilis NOT DETECTED NOT DETECTED Final   Enterobacterales NOT DETECTED NOT DETECTED Final   Enterobacter cloacae complex NOT DETECTED NOT DETECTED Final   Escherichia coli NOT DETECTED NOT DETECTED Final   Klebsiella aerogenes NOT DETECTED NOT DETECTED Final   Klebsiella oxytoca NOT DETECTED NOT DETECTED Final   Klebsiella pneumoniae NOT DETECTED NOT DETECTED Final   Proteus species NOT DETECTED NOT DETECTED Final   Salmonella species NOT DETECTED NOT DETECTED Final   Serratia marcescens NOT DETECTED NOT DETECTED Final   Haemophilus influenzae NOT DETECTED NOT DETECTED Final   Neisseria meningitidis NOT DETECTED NOT  DETECTED Final   Pseudomonas aeruginosa NOT DETECTED NOT DETECTED Final   Stenotrophomonas maltophilia NOT DETECTED NOT DETECTED Final   Candida albicans NOT DETECTED NOT DETECTED Final   Candida auris NOT DETECTED NOT DETECTED Final   Candida glabrata NOT DETECTED NOT DETECTED Final   Candida krusei NOT DETECTED NOT DETECTED Final  Candida parapsilosis NOT DETECTED NOT DETECTED Final   Candida tropicalis NOT DETECTED NOT DETECTED Final   Cryptococcus neoformans/gattii NOT DETECTED NOT DETECTED Final    Comment: Performed at [facility], [address], [place], [place] [address]  Urine culture     Status: Abnormal   Collection Time: 01/21/21  1:28 PM   Specimen: In/Out Cath Urine  Result Value Ref Range Status   Specimen Description IN/OUT CATH URINE  Final   Special Requests   Final    NONE Performed at [facility], [address] [place], [place] [address]    Culture >=100,000 COLONIES/mL ESCHERICHIA COLI (A)  Final   Report Status 01/24/2021 FINAL  Final   Organism ID, Bacteria ESCHERICHIA COLI (A)  Final      Susceptibility   Escherichia coli - MIC*    AMPICILLIN 8 SENSITIVE Sensitive     CEFAZOLIN <=4 SENSITIVE Sensitive     CEFEPIME <=0.12 SENSITIVE Sensitive     CEFTRIAXONE <=0.25 SENSITIVE Sensitive     CIPROFLOXACIN <=0.25 SENSITIVE Sensitive     GENTAMICIN <=1 SENSITIVE Sensitive     IMIPENEM <=0.25 SENSITIVE Sensitive     NITROFURANTOIN <=16 SENSITIVE Sensitive     TRIMETH/SULFA <=20 SENSITIVE Sensitive     AMPICILLIN/SULBACTAM <=2 SENSITIVE Sensitive     PIP/TAZO <=4 SENSITIVE Sensitive     * >=100,000 COLONIES/mL ESCHERICHIA COLI  Resp Panel by RT-PCR (Flu A&B, Covid) Nasopharyngeal Swab     Status: None   Collection Time: 01/21/21  5:01 PM   Specimen: Nasopharyngeal Swab; Nasopharyngeal(NP) swabs in vial transport medium  Result Value Ref Range Status   SARS Coronavirus 2 by RT PCR NEGATIVE NEGATIVE Final    Comment: (NOTE) SARS-CoV-2 target  nucleic acids are NOT DETECTED.  The SARS-CoV-2 RNA is generally detectable in upper respiratory specimens during the acute phase of infection. The lowest concentration of SARS-CoV-2 viral copies this assay can detect is 138 copies/mL. A negative result does not preclude SARS-Cov-2 infection and should not be used as the sole basis for treatment or other patient management decisions. A negative result may occur with  improper specimen collection/handling, submission of specimen other than nasopharyngeal swab, presence of viral mutation(s) within the areas targeted by this assay, and inadequate number of viral copies(<138 copies/mL). A negative result must be combined with clinical observations, patient history, and epidemiological information. The expected result is Negative.  Fact Sheet for Patients:  BloggerCourse.com  Fact Sheet for Healthcare Providers:  SeriousBroker.it  This test is no t yet approved or cleared by the [place] FDA and  has been authorized for detection and/or diagnosis of SARS-CoV-2 by FDA under an Emergency Use Authorization (EUA). This EUA will remain  in effect (meaning this test can be used) for the duration of the COVID-19 declaration under Section 564(b)(1) of the Act, 21 U.S.C.section 360bbb-3(b)(1), unless the authorization is terminated  or revoked sooner.       Influenza A by PCR NEGATIVE NEGATIVE Final   Influenza B by PCR NEGATIVE NEGATIVE Final    Comment: (NOTE) The Xpert Xpress SARS-CoV-2/FLU/RSV plus assay is intended as an aid in the diagnosis of influenza from Nasopharyngeal swab specimens and should not be used as a sole basis for treatment. Nasal washings and aspirates are unacceptable for Xpert Xpress SARS-CoV-2/FLU/RSV testing.  Fact Sheet for Patients: BloggerCourse.com  Fact Sheet for Healthcare  Providers: SeriousBroker.it  This test is not yet approved or cleared by the [place] and  has been authorized for detection and/or diagnosis of SARS-CoV-2 by FDA under an Emergency Use Authorization (EUA). This EUA will remain in effect (meaning this test can be used) for the duration of the COVID-19 declaration under Section 564(b)(1) of the Act, 21 U.S.C. section 360bbb-3(b)(1), unless the authorization is terminated or revoked.  Performed at [facility], [address] [place], [place] [address]   Culture, blood (Routine X 2) w Reflex to ID Panel     Status: None   Collection Time: 01/23/21  1:24 PM   Specimen: BLOOD LEFT HAND  Result Value Ref Range Status   Specimen Description BLOOD LEFT HAND  Final   Special Requests   Final    BOTTLES DRAWN AEROBIC AND ANAEROBIC Blood Culture results may not be optimal due to an inadequate volume of blood received in culture bottles   Culture   Final    NO GROWTH 5 DAYS Performed at [facility], [address], [place], [place] [address]    Report Status 01/28/2021 FINAL  Final  Culture, blood (Routine X 2) w Reflex to ID Panel     Status: None   Collection Time: 01/23/21  1:24 PM   Specimen: BLOOD RIGHT HAND  Result Value Ref Range Status   Specimen Description BLOOD RIGHT HAND  Final   Special Requests   Final    BOTTLES DRAWN AEROBIC AND ANAEROBIC Blood Culture results may not be optimal due to an inadequate volume of blood received in culture bottles   Culture   Final    NO GROWTH 5 DAYS Performed at [facility], [address] [place], [place] [address]    Report Status 01/28/2021 FINAL  Final         Radiology Studies: No results found.      Scheduled Meds: . acetaminophen  650 mg Oral Q6H  . diltiazem  180 mg Oral Daily  . docusate sodium  100 mg Oral BID  . enoxaparin (LOVENOX) injection  40 mg Subcutaneous Q24H  . hydrALAZINE  10 mg Oral Q8H  .  hydrocerin   Topical BID  . insulin aspart  0-9 Units Subcutaneous TID WC  . levothyroxine  100 mcg Oral Daily  . lidocaine  1 patch Transdermal Q24H  . losartan  100 mg Oral Daily  . magnesium oxide  200 mg Oral BID  . methocarbamol  500 mg Oral TID  . pantoprazole  40 mg Oral BID   Continuous Infusions:    LOS: 9 days    Time spent: 35 minutes    Daielle Melcher A Taunia Frasco, MD [facility]   If 7PM-7AM, please contact night-coverage [email or web address]  01/30/2021, 3:11 PM

## 2021-01-30 NOTE — [place] FL2 (Signed)
[place] MEDICAID FL2 LEVEL OF CARE SCREENING TOOL     IDENTIFICATION  Patient Name: Kristina Gates Birthdate: 07-Nov-1956 Sex: female Admission Date (Current Location): 01/21/2021  [facility] and [place] Number:  Producer, television/film/video and Address:  [facility], [address], [place], [place] [address]      Provider Number: [id number]  Attending Physician Name and Address:  Alba Cory, MD  Relative Name and Phone Number:  Danella Deis [phone]    Current Level of Care: Hospital Recommended Level of Care: Skilled Nursing Facility Prior Approval Number:    Date Approved/Denied:   PASRR Number: [id number]  Discharge Plan: SNF    Current Diagnoses: Patient Active Problem List   Diagnosis Date Noted  . SVT (supraventricular tachycardia) (HCC)   . Traumatic rhabdomyolysis (HCC) 01/22/2021  . Diabetes mellitus without complication (HCC)   . Essential hypertension 01/21/2021  . Hyperglycemia due to type 2 diabetes mellitus (HCC) 01/21/2021  . 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/2021  . Pure hypercholesterolemia 01/21/2021  . Sepsis (HCC) 01/21/2021    Orientation RESPIRATION BLADDER Height & Weight     Self,Time,Situation,Place  O2 (1L) Incontinent,External catheter Weight: 200 lb (90.7 kg) Height:  5\' 6"  (167.6 cm)  BEHAVIORAL SYMPTOMS/MOOD NEUROLOGICAL BOWEL NUTRITION STATUS      Continent Diet (see discharge summary)  AMBULATORY STATUS COMMUNICATION OF NEEDS Skin   Limited Assist Verbally Skin abrasions,Surgical wounds (Left elbow)                       Personal Care Assistance Level of Assistance  Bathing,Feeding,Dressing Bathing Assistance: Limited assistance Feeding assistance: Independent Dressing Assistance: Limited assistance     Functional Limitations Info  Sight,Hearing,Speech Sight Info: Impaired Hearing Info: Adequate Speech Info: Adequate    SPECIAL CARE FACTORS FREQUENCY  PT (By licensed PT),OT (By licensed OT)      PT Frequency: 5x per week OT Frequency: 5x per week            Contractures Contractures Info: Not present    Additional Factors Info  Code Status,Allergies,Insulin Sliding Scale Code Status Info: Full Allergies Info: Metformin And Related, Zestril (Lisinopril)   Insulin Sliding Scale Info: insulin aspart (novoLOG) injection 0-9 Units (3x/day with meals)       Current Medications (01/30/2021):  This is the current hospital active medication list Current Facility-Administered Medications  Medication Dose Route Frequency Provider Last Rate Last Admin  . acetaminophen (TYLENOL) tablet 650 mg  650 mg Oral Q6H 04/01/2021, PA-C   650 mg at 01/30/21 0948  . adenosine (ADENOCARD) 12 MG/4ML injection 12 mg  12 mg Intravenous Once PRN Johnson, Clanford L, MD      . diltiazem (CARDIZEM CD) 24 hr capsule 180 mg  180 mg Oral Daily 04/01/21, PA-C   180 mg at 01/30/21 0948  . docusate sodium (COLACE) capsule 100 mg  100 mg Oral BID 04/01/21, PA-C   100 mg at 01/30/21 04/01/21  . enoxaparin (LOVENOX) injection 40 mg  40 mg Subcutaneous Q24H Johnson, Clanford L, MD   40 mg at 01/30/21 0949  . hydrALAZINE (APRESOLINE) tablet 10 mg  10 mg Oral Q8H Regalado, Belkys A, MD   10 mg at 01/30/21 0556  . hydrocerin (EUCERIN) cream   Topical BID 04/01/21, MD   1 application at 01/30/21 0955  . HYDROcodone-acetaminophen (NORCO/VICODIN) 5-325 MG per tablet 1-2 tablet  1-2 tablet Oral Q4H PRN 04/01/21  Tublu, MD      . insulin aspart (novoLOG) injection 0-9 Units  0-9 Units Subcutaneous TID WC Pieter Partridge, MD   1 Units at 01/26/21 1215  . levothyroxine (SYNTHROID) tablet 100 mcg  100 mcg Oral Daily Leandro Reasoner Tublu, MD   100 mcg at 01/30/21 0948  . lidocaine (LIDODERM) 5 % 1 patch  1 patch Transdermal Q24H Juliet Rude, PA-C   1 patch at 01/30/21 1001  . losartan (COZAAR) tablet 100 mg  100 mg Oral Daily Regalado, Belkys A, MD   100 mg  at 01/30/21 0947  . magnesium oxide (MAG-OX) tablet 200 mg  200 mg Oral BID Regalado, Belkys A, MD   200 mg at 01/30/21 0948  . methocarbamol (ROBAXIN) tablet 500 mg  500 mg Oral TID Juliet Rude, PA-C   500 mg at 01/30/21 0948  . ondansetron (ZOFRAN) injection 4 mg  4 mg Intravenous Q6H PRN Johnson, Clanford L, MD      . pantoprazole (PROTONIX) EC tablet 40 mg  40 mg Oral BID Johnson, Clanford L, MD   40 mg at 01/30/21 0947  . polyethylene glycol (MIRALAX / GLYCOLAX) packet 17 g  17 g Oral Daily PRN Trixie Deis R, PA-C      . sodium chloride (OCEAN) 0.65 % nasal spray 1 spray  1 spray Each Nare Daily PRN Luberta Robertson Raynaldo Opitz, MD         Discharge Medications: Please see discharge summary for a list of discharge medications.  Relevant Imaging Results:  Relevant Lab Results:   Additional Information SSN# [id number] patient is vaccinated and boostered  Patrice Paradise, LCSW

## 2021-01-30 NOTE — TOC Initial Note (Signed)
Transition of Care [facility]) - Initial/Assessment Note    Patient Details  Name: Kristina Gates MRN: [id number] Date of Birth: 01/21/1956  Transition of Care [facility]) CM/SW Contact:    Bary Castilla, LCSW Phone Number: [phone] 01/30/2021, 10:42 AM  Clinical Narrative:                  CSW met with patient to discuss PT recommendation of a SNF after denial of CIR. Patient was aware of recommendation and in agreement with going to a ST SNF. CSW discussed the SNF process.CSW provided patient with medicare.gov rating list.  Patient gave CSW permission to fax referrals out to local facilities.CSW answered questions about the SNF process and the next steps in the process. CSW received permission to call patient's sister Isaias Sakai and spoke with her about the SNF process and answered questions.  TOC team will continue to assist with discharge planning needs.     Expected Discharge Plan: Skilled Nursing Facility Barriers to Discharge: Insurance Authorization,SNF Pending bed offer   Patient Goals and CMS Choice   CMS Medicare.gov Compare Post Acute Care list provided to:: Patient    Expected Discharge Plan and Services Expected Discharge Plan: [place]       Living arrangements for the past 2 months: Single Family Home                                      Prior Living Arrangements/Services Living arrangements for the past 2 months: Single Family Home Lives with:: Self Patient language and need for interpreter reviewed:: Yes          Care giver support system in place?: Yes (comment)      Activities of Daily Living Home Assistive Devices/Equipment: Eyeglasses ADL Screening (condition at time of admission) Patient's cognitive ability adequate to safely complete daily activities?: Yes Is the patient deaf or have difficulty hearing?: No Does the patient have difficulty seeing, even when wearing glasses/contacts?: No Does the patient have difficulty  concentrating, remembering, or making decisions?: No Patient able to express need for assistance with ADLs?: Yes Does the patient have difficulty dressing or bathing?: No Independently performs ADLs?: Yes (appropriate for developmental age) Does the patient have difficulty walking or climbing stairs?: Yes Weakness of Legs: Both Weakness of Arms/Hands: None  Permission Sought/Granted Permission sought to share information with : Family Supports Permission granted to share information with : Yes, Verbal Permission Granted  Share Information with NAME: Isaias Sakai  Permission granted to share info w AGENCY: SNFs  Permission granted to share info w Relationship: sister  Permission granted to share info w Contact Information: [phone]  Emotional Assessment Appearance:: Appears stated age Attitude/Demeanor/Rapport: Engaged Affect (typically observed): Accepting,Adaptable Orientation: : Oriented to Self,Oriented to Place,Oriented to  Time,Oriented to Situation      Admission diagnosis:  Sepsis ([place]) [A41.9] Traumatic rhabdomyolysis, initial encounter ([place]) [T79.6XXA] Patient Active Problem List   Diagnosis Date Noted  . SVT (supraventricular tachycardia) ([place])   . Traumatic rhabdomyolysis ([place]) 01/22/2021  . Diabetes mellitus without complication ([place])   . Essential hypertension 01/21/2021  . Hyperglycemia due to type 2 diabetes mellitus ([place]) 01/21/2021  . 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/2021  . Pure hypercholesterolemia 01/21/2021  . Sepsis ([place]) 01/21/2021   PCP:  Patient, No Pcp Per Pharmacy:   [facility] [address], [place] - [place] [address] [place] [place]  [address] Phone: [phone] Fax: [phone]     Social Determinants of Health (SDOH) Interventions    Readmission Risk Interventions No flowsheet data found.

## 2021-01-31 DIAGNOSIS — I471 Supraventricular tachycardia: Secondary | ICD-10-CM | POA: Diagnosis not present

## 2021-01-31 LAB — GLUCOSE, CAPILLARY
Glucose-Capillary: 116 mg/dL — ABNORMAL HIGH (ref 70–99)
Glucose-Capillary: 93 mg/dL (ref 70–99)
Glucose-Capillary: 107 mg/dL — ABNORMAL HIGH (ref 70–99)

## 2021-01-31 NOTE — Progress Notes (Signed)
PROGRESS NOTE    Kristina Gates  HER:[id number] DOB: Oct 01, 1956 DOA: 01/21/2021 PCP: Patient, No Pcp Per   Brief Narrative: 65 year old with past medical history significant for hypertension, diabetes type 2, who was in her usual state of health until about 4 to 5 days prior to admission, she felt weak.  She does not remember falling.  She was found down at home, her sister did not hear from her for 2 days.  Police was asked to do a welfare check when they found patient down covered in urine. Patient was diagnosed with UTI, lactic acidosis, significant rhabdomyolysis with a CPK 20 6K, AKI.  CT head was negative for acute intracranial bleed.  X-ray revealed 4 consecutive rib fractures posteriorly without pneumothorax.  She was treated with sepsis with aggressive fluid resuscitation and initiation of broad-spectrum antibiotics.  Her mental status improved steadily.  She did develop an acute episode of SVT with heart rate 206.  She received adenosine.    Assessment & Plan:   Active Problems:   Essential hypertension   Hyperglycemia due to type 2 diabetes mellitus (HCC)   Postoperative hypothyroidism   Pure hypercholesterolemia   Sepsis (H[facility])   Traumatic rhabdomyolysis (H[facility])   Diabetes mellitus without complication (HCC)   SVT (supraventricular tachycardia) (H[facility])  1-Recurrent SVT: -Had episode of SVT in the ED received adenosine.  -Patient had episode of SVT 3/8.  She received 6 mg of adenosine.  At the moment her blood pressure was 88/60.  She also received IV fluids. -Cardiology  was consulted.  Atenolol was changed to Cardizem.  No further episode. -She will need to follow-up with EP as an outpatient. -No further episodes.  -on magnesium supplement.   2-Sepsis: Secondary to UTI. Presents with tachycardia, tachypnea, leukocytosis.  Resolved: ID was consulted.  Off of antibiotics.  3-Nontraumatic Rhabdomyolysis:  -Presented with a CK level more than 20 6K.  She received aggressive IV  fluids.  CK level now to 1000 -CK level down 768---476. Stop fluids.   4-Metabolic acidosis: Improved with bicarb solution. Resolved.  5-E. coli UTI: Off of antibiotics. Received 3 days treatment. Urine grew E coli,.  6-Staph bacteremia: ID ID consulted, likely a contaminant.  Repeat blood cultures 01/23/2021 no growth today. 7-Rib fractures: Pain management. 8-Hypokalemia hypomagnesemia: received IV magnesium. On oral supplement 9-Transaminases: Likely related to elevated CK level 10-Elevated troponin: Suspected related to rhabdomyolysis. 11-AKI resolved with IV fluids 12-Gastritis: On Protonix 13-left parotid gland lesion: Need outpatient follow-up 14-Hypertension: Continue to hold hydrochlorothiazide .  Started on Cardizem Resume losartan. BP continue to be elevated. Started low dose hydralazine.  BP better controlled.   Estimated body mass index is 31.34 kg/m as calculated from the following:   Height as of this encounter: 5\' 6"  (1.676 m).   Weight as of this encounter: 88.1 kg.   DVT prophylaxis: Lovenox Code Status: Full code Family Communication: Care discussed with patient. Disposition Plan:  Status is: Inpatient  Remains inpatient appropriate because:Unsafe d/c plan   Dispo: The patient is from: Home              Anticipated d/c is to: SNF              Patient currently is medically stable to d/c. Awaiting SNF   Difficult to place patient No        Consultants:   EP EP  ID  Procedures:   NA  Antimicrobials:  Cefepime from 3/3 until 3/6 Vancomycin from 3/3 on 33/6  Subjective: No new complaints, feels well.    Objective: Vitals:   01/30/21 1420 01/30/21 1959 01/31/21 0552 01/31/21 1047  BP: (!) 146/81 (!) 147/77 (!) 149/77 (!) 141/75  Pulse:  74 77   Resp:  18 14   Temp:  97.7 F (36.5 C) 98.2 F (36.8 C)   TempSrc:  Oral Axillary   SpO2:  99% 99%   Weight:   88.1 kg   Height:        Intake/Output Summary (Last 24 hours) at 01/31/2021  1413 Last data filed at 01/31/2021 0630 Gross per 24 hour  Intake --  Output 2052 ml  Net -2052 ml   Filed Weights   01/29/21 0603 01/30/21 0438 01/31/21 0552  Weight: 86.2 kg 90.7 kg 88.1 kg    Examination:  General exam: NAD Respiratory system: CTA Cardiovascular system: S 1,  S 2 RRR Gastrointestinal system: BS present, soft, nt Central nervous system: ALert Extremities: no edema    Data Reviewed: I have personally reviewed following labs and imaging studies  CBC: Recent Labs  Lab 01/25/21 0545 01/30/21 0208  WBC 6.9 7.4  NEUTROABS 4.3  --   HGB 12.0 13.0  HCT 34.3* 38.2  MCV 92.5 95.5  PLT 160 273   Basic Metabolic Panel: Recent Labs  Lab 01/26/21 0101 01/27/21 0229 01/28/21 0221 01/29/21 0217 01/30/21 0208  NA 135 134* 136 134* 134*  K 3.3* 3.7 3.5 4.2 3.8  CL 95* 98 101 102 101  CO2 33* 29 26 25 26   GLUCOSE 122* 105* 98 105* 119*  BUN <5* <5* 5* 7* 6*  CREATININE 0.50 0.46 0.55 0.68 0.66  CALCIUM 8.1* 8.3* 8.8* 8.9 9.2  MG 1.6* 2.1 1.6* 1.7 1.8   GFR: Estimated Creatinine Clearance: 77.3 mL/min (by C-G formula based on SCr of 0.66 mg/dL). Liver Function Tests: Recent Labs  Lab 01/25/21 0545 01/27/21 0229  AST 107* 115*  ALT 90* 107*  ALKPHOS 42 59  BILITOT 0.6 0.5  PROT 4.9* 5.1*  ALBUMIN 2.5* 2.7*   No results for input(s): LIPASE, AMYLASE in the last 168 hours. No results for input(s): AMMONIA in the last 168 hours. Coagulation Profile: No results for input(s): INR, PROTIME in the last 168 hours. Cardiac Enzymes: Recent Labs  Lab 01/25/21 0545 01/26/21 0101 01/27/21 0229 01/28/21 0221 01/29/21 0217  CKTOTAL 1,829* 1,423* 1,042* 768* 476*   BNP (last 3 results) No results for input(s): PROBNP in the last 8760 hours. HbA1C: No results for input(s): HGBA1C in the last 72 hours. CBG: Recent Labs  Lab 01/30/21 0819 01/30/21 1154 01/30/21 1718 01/30/21 2122 01/31/21 0737  GLUCAP 114* 121* 88 103* 116*   Lipid  Profile: No results for input(s): CHOL, HDL, LDLCALC, TRIG, CHOLHDL, LDLDIRECT in the last 72 hours. Thyroid Function Tests: No results for input(s): TSH, T4TOTAL, FREET4, T3FREE, THYROIDAB in the last 72 hours. Anemia Panel: No results for input(s): VITAMINB12, FOLATE, FERRITIN, TIBC, IRON, RETICCTPCT in the last 72 hours. Sepsis Labs: No results for input(s): PROCALCITON, LATICACIDVEN in the last 168 hours.  Recent Results (from the past 240 hour(s))  Urine culture     Status: Abnormal   Collection Time: 01/21/21  1:28 PM   Specimen: In/Out Cath Urine  Result Value Ref Range Status   Specimen Description IN/OUT CATH URINE  Final   Special Requests   Final    NONE Performed at [facility], [address] [place], [place] [address]    Culture >=100,000 COLONIES/mL  ESCHERICHIA COLI (A)  Final   Report Status 01/24/2021 FINAL  Final   Organism ID, Bacteria ESCHERICHIA COLI (A)  Final      Susceptibility   Escherichia coli - MIC*    AMPICILLIN 8 SENSITIVE Sensitive     CEFAZOLIN <=4 SENSITIVE Sensitive     CEFEPIME <=0.12 SENSITIVE Sensitive     CEFTRIAXONE <=0.25 SENSITIVE Sensitive     CIPROFLOXACIN <=0.25 SENSITIVE Sensitive     GENTAMICIN <=1 SENSITIVE Sensitive     IMIPENEM <=0.25 SENSITIVE Sensitive     NITROFURANTOIN <=16 SENSITIVE Sensitive     TRIMETH/SULFA <=20 SENSITIVE Sensitive     AMPICILLIN/SULBACTAM <=2 SENSITIVE Sensitive     PIP/TAZO <=4 SENSITIVE Sensitive     * >=100,000 COLONIES/mL ESCHERICHIA COLI  Resp Panel by RT-PCR (Flu A&B, Covid) Nasopharyngeal Swab     Status: None   Collection Time: 01/21/21  5:01 PM   Specimen: Nasopharyngeal Swab; Nasopharyngeal(NP) swabs in vial transport medium  Result Value Ref Range Status   SARS Coronavirus 2 by RT PCR NEGATIVE NEGATIVE Final    Comment: (NOTE) SARS-CoV-2 target nucleic acids are NOT DETECTED.  The SARS-CoV-2 RNA is generally detectable in upper respiratory specimens during the acute phase of  infection. The lowest concentration of SARS-CoV-2 viral copies this assay can detect is 138 copies/mL. A negative result does not preclude SARS-Cov-2 infection and should not be used as the sole basis for treatment or other patient management decisions. A negative result may occur with  improper specimen collection/handling, submission of specimen other than nasopharyngeal swab, presence of viral mutation(s) within the areas targeted by this assay, and inadequate number of viral copies(<138 copies/mL). A negative result must be combined with clinical observations, patient history, and epidemiological information. The expected result is Negative.  Fact Sheet for Patients:  BloggerCourse.com  Fact Sheet for Healthcare Providers:  SeriousBroker.it  This test is no t yet approved or cleared by the [place] FDA and  has been authorized for detection and/or diagnosis of SARS-CoV-2 by FDA under an Emergency Use Authorization (EUA). This EUA will remain  in effect (meaning this test can be used) for the duration of the COVID-19 declaration under Section 564(b)(1) of the Act, 21 U.S.C.section 360bbb-3(b)(1), unless the authorization is terminated  or revoked sooner.       Influenza A by PCR NEGATIVE NEGATIVE Final   Influenza B by PCR NEGATIVE NEGATIVE Final    Comment: (NOTE) The Xpert Xpress SARS-CoV-2/FLU/RSV plus assay is intended as an aid in the diagnosis of influenza from Nasopharyngeal swab specimens and should not be used as a sole basis for treatment. Nasal washings and aspirates are unacceptable for Xpert Xpress SARS-CoV-2/FLU/RSV testing.  Fact Sheet for Patients: BloggerCourse.com  Fact Sheet for Healthcare Providers: SeriousBroker.it  This test is not yet approved or cleared by the [place] FDA and has been authorized for detection and/or diagnosis of SARS-CoV-2  by FDA under an Emergency Use Authorization (EUA). This EUA will remain in effect (meaning this test can be used) for the duration of the COVID-19 declaration under Section 564(b)(1) of the Act, 21 U.S.C. section 360bbb-3(b)(1), unless the authorization is terminated or revoked.  Performed at [facility], [address] [place], [place] [address]   Culture, blood (Routine X 2) w Reflex to ID Panel     Status: None   Collection Time: 01/23/21  1:24 PM   Specimen: BLOOD LEFT HAND  Result Value Ref Range Status   Specimen Description BLOOD  LEFT HAND  Final   Special Requests   Final    BOTTLES DRAWN AEROBIC AND ANAEROBIC Blood Culture results may not be optimal due to an inadequate volume of blood received in culture bottles   Culture   Final    NO GROWTH 5 DAYS Performed at [facility], [address], [place], [place] [address]    Report Status 01/28/2021 FINAL  Final  Culture, blood (Routine X 2) w Reflex to ID Panel     Status: None   Collection Time: 01/23/21  1:24 PM   Specimen: BLOOD RIGHT HAND  Result Value Ref Range Status   Specimen Description BLOOD RIGHT HAND  Final   Special Requests   Final    BOTTLES DRAWN AEROBIC AND ANAEROBIC Blood Culture results may not be optimal due to an inadequate volume of blood received in culture bottles   Culture   Final    NO GROWTH 5 DAYS Performed at [facility], [address], [place], [place] [address]    Report Status 01/28/2021 FINAL  Final         Radiology Studies: No results found.      Scheduled Meds: . acetaminophen  650 mg Oral Q6H  . diltiazem  180 mg Oral Daily  . docusate sodium  100 mg Oral BID  . enoxaparin (LOVENOX) injection  40 mg Subcutaneous Q24H  . hydrALAZINE  10 mg Oral Q8H  . hydrocerin   Topical BID  . insulin aspart  0-9 Units Subcutaneous TID WC  . levothyroxine  100 mcg Oral Daily  . lidocaine  1 patch Transdermal Q24H  . losartan  100 mg Oral Daily  . magnesium  oxide  200 mg Oral BID  . methocarbamol  500 mg Oral TID  . pantoprazole  40 mg Oral BID   Continuous Infusions:    LOS: 10 days    Time spent: 35 minutes    Tal Kempker A Elliett Guarisco, MD [facility]   If 7PM-7AM, please contact night-coverage [email or web address]  01/31/2021, 2:13 PM

## 2021-01-31 NOTE — TOC Progression Note (Signed)
Transition of Care [facility]) - Progression Note    Patient Details  Name: Kristina Gates MRN: [id number] Date of Birth: 12-May-1956  [facility]) CM/SW Contact  Patrice [place], [place] Phone Number: [phone] 01/31/2021, 4:59 PM  Clinical Narrative:    CSW spoke with patient's via phone to discuss bed offers. Patient chose [facility]. CSW attempted to start authorization however insurance is not managed by [employer]. CSW spoke with [place] from [facility] and they can accept patient tomorrow and she will start authorization.  MD alerted that new COVID is needed.  TOC team will continue to assist with discharge planning needs.   Expected Discharge Plan: Skilled Nursing Facility Barriers to Discharge: Insurance Authorization,SNF Pending bed offer  Expected Discharge Plan and Services Expected Discharge Plan: Skilled Nursing Facility       Living arrangements for the past 2 months: Single Family Home                                       Social Determinants of Health (SDOH) Interventions    Readmission Risk Interventions No flowsheet data found.

## 2021-02-01 DIAGNOSIS — I471 Supraventricular tachycardia: Secondary | ICD-10-CM | POA: Diagnosis not present

## 2021-02-01 DIAGNOSIS — R278 Other lack of coordination: Secondary | ICD-10-CM | POA: Diagnosis not present

## 2021-02-01 DIAGNOSIS — T796XXD Traumatic ischemia of muscle, subsequent encounter: Secondary | ICD-10-CM | POA: Diagnosis not present

## 2021-02-01 DIAGNOSIS — S2249XD Multiple fractures of ribs, unspecified side, subsequent encounter for fracture with routine healing: Secondary | ICD-10-CM | POA: Diagnosis not present

## 2021-02-01 DIAGNOSIS — Z7401 Bed confinement status: Secondary | ICD-10-CM | POA: Diagnosis not present

## 2021-02-01 DIAGNOSIS — E890 Postprocedural hypothyroidism: Secondary | ICD-10-CM | POA: Diagnosis not present

## 2021-02-01 DIAGNOSIS — M2550 Pain in unspecified joint: Secondary | ICD-10-CM | POA: Diagnosis not present

## 2021-02-01 DIAGNOSIS — E7800 Pure hypercholesterolemia, unspecified: Secondary | ICD-10-CM | POA: Diagnosis not present

## 2021-02-01 DIAGNOSIS — M6281 Muscle weakness (generalized): Secondary | ICD-10-CM | POA: Diagnosis not present

## 2021-02-01 DIAGNOSIS — B9620 Unspecified Escherichia coli [E. coli] as the cause of diseases classified elsewhere: Secondary | ICD-10-CM | POA: Diagnosis not present

## 2021-02-01 DIAGNOSIS — R2681 Unsteadiness on feet: Secondary | ICD-10-CM | POA: Diagnosis not present

## 2021-02-01 DIAGNOSIS — R221 Localized swelling, mass and lump, neck: Secondary | ICD-10-CM | POA: Diagnosis not present

## 2021-02-01 DIAGNOSIS — E1129 Type 2 diabetes mellitus with other diabetic kidney complication: Secondary | ICD-10-CM | POA: Diagnosis not present

## 2021-02-01 DIAGNOSIS — N390 Urinary tract infection, site not specified: Secondary | ICD-10-CM | POA: Diagnosis not present

## 2021-02-01 DIAGNOSIS — A419 Sepsis, unspecified organism: Secondary | ICD-10-CM | POA: Diagnosis not present

## 2021-02-01 DIAGNOSIS — K2950 Unspecified chronic gastritis without bleeding: Secondary | ICD-10-CM | POA: Diagnosis not present

## 2021-02-01 DIAGNOSIS — R262 Difficulty in walking, not elsewhere classified: Secondary | ICD-10-CM | POA: Diagnosis not present

## 2021-02-01 DIAGNOSIS — I10 Essential (primary) hypertension: Secondary | ICD-10-CM | POA: Diagnosis not present

## 2021-02-01 LAB — GLUCOSE, CAPILLARY
Glucose-Capillary: 118 mg/dL — ABNORMAL HIGH (ref 70–99)
Glucose-Capillary: 142 mg/dL — ABNORMAL HIGH (ref 70–99)
Glucose-Capillary: 110 mg/dL — ABNORMAL HIGH (ref 70–99)
Glucose-Capillary: 112 mg/dL — ABNORMAL HIGH (ref 70–99)

## 2021-02-01 LAB — SARS CORONAVIRUS 2 (TAT 6-24 HRS): SARS Coronavirus 2: NEGATIVE

## 2021-02-01 MED ORDER — DILTIAZEM HCL ER COATED BEADS 180 MG PO CP24: 180.0000 mg | ORAL_CAPSULE | Freq: Every day | ORAL | 6 refills | Status: DC

## 2021-02-01 MED ORDER — DOCUSATE SODIUM 100 MG PO CAPS: 100.0000 mg | ORAL_CAPSULE | Freq: Two times a day (BID) | ORAL | 0 refills | Status: DC

## 2021-02-01 MED ORDER — PANTOPRAZOLE SODIUM 40 MG PO TBEC: 40.0000 mg | DELAYED_RELEASE_TABLET | Freq: Two times a day (BID) | ORAL | 0 refills | Status: DC

## 2021-02-01 MED ORDER — METHOCARBAMOL 500 MG PO TABS: 500.0000 mg | ORAL_TABLET | Freq: Three times a day (TID) | ORAL | 0 refills | Status: DC | PRN

## 2021-02-01 MED ORDER — MAGNESIUM OXIDE 400 (241.3 MG) MG PO TABS: 200.0000 mg | ORAL_TABLET | Freq: Two times a day (BID) | ORAL | 0 refills | Status: DC

## 2021-02-01 MED ORDER — HYDRALAZINE HCL 10 MG PO TABS: 10.0000 mg | ORAL_TABLET | Freq: Three times a day (TID) | ORAL | 3 refills | Status: DC

## 2021-02-01 NOTE — Discharge Summary (Signed)
Physician Discharge Summary  Kristina Gates ZOX:[id number] DOB: [birth year] DOA: 01/21/2021  PCP: Patient, No Pcp Per  Admit date: 01/21/2021 Discharge date: 02/01/2021  Admitted From: Home  Disposition: SNF  Recommendations for Outpatient Follow-up:  1. Follow up with PCP in 1-2 weeks 2. Please obtain BMP/CBC in one week 3. Repeat LFT in 3 days.  4. Monitor electrolytes.  5. Needs to follow up with Cardiology for SVT    Discharge Condition: Stable.  CODE STATUS: Full Code Diet recommendation: Heart Healthy  Brief/Interim Summary: 65 year old with past medical history significant for hypertension, diabetes type 2, who was in her usual state of health until about 4 to 5 days prior to admission, she felt weak.  She does not remember falling.  She was found down at home, her sister did not hear from her for 2 days.  Police was asked to do a welfare check when they found patient down covered in urine. Patient was diagnosed with UTI, lactic acidosis, significant rhabdomyolysis with a CPK 20 6K, AKI.  CT head was negative for acute intracranial bleed.  X-ray revealed 4 consecutive rib fractures posteriorly without pneumothorax.  She was treated with sepsis with aggressive fluid resuscitation and initiation of broad-spectrum antibiotics.  Her mental status improved steadily.  She did develop an acute episode of SVT with heart rate 206.  She received adenosine. She was started on Cardizem, no further episodes.   1-Recurrent SVT: -Had episode of SVT in the ED received adenosine.  -Patient had episode of SVT 3/8.  She received 6 mg of adenosine.  At the moment her blood pressure was 88/60.  She also received IV fluids. -Cardiology  was consulted.  Atenolol was changed to Cardizem.  No further episode. -She will need to follow-up with EP as an outpatient. -No further episodes.  -on magnesium supplement. Monitor electrolytes.   2-Sepsis: Secondary to UTI. Presents with tachycardia, tachypnea,  leukocytosis.  Resolved: ID was consulted.  Off of antibiotics.  3-Nontraumatic Rhabdomyolysis:  -Presented with a CK level more than 20 6K.  She received aggressive IV fluids.  CK level now to 1000 -CK level down 768---476. Stop fluids.   4-Metabolic acidosis: Improved with bicarb solution. Resolved.  5-E. coli UTI: Off of antibiotics. Received 3 days treatment. Urine grew E coli,.  6-Staph bacteremia: ID ID consulted, likely a contaminant.  Repeat blood cultures 01/23/2021 no growth today. 7-Rib fractures: Pain management. Change robaxin to PRN 8-Hypokalemia hypomagnesemia: received IV magnesium. On oral supplement 9-Transaminases: Likely related to elevated CK level 10-Elevated troponin: Suspected related to rhabdomyolysis. 11-AKI resolved with IV fluids 12-Gastritis: On Protonix 13-left parotid gland lesion: Need outpatient follow-up 14-Hypertension: Continue to hold hydrochlorothiazide .  Started on Cardizem Resume losartan. BP continue to be elevated. Started low dose hydralazine.  BP better controlled.   Patient is stable to be transfer to SNF  Discharge Diagnoses:  Active Problems:   Essential hypertension   Hyperglycemia due to type 2 diabetes mellitus (HCC)   Postoperative hypothyroidism   Pure hypercholesterolemia   Sepsis (HCC)   Traumatic rhabdomyolysis (HCC)   Diabetes mellitus without complication (HCC)   SVT (supraventricular tachycardia) (HCC)    Discharge Instructions  Discharge Instructions    Diet - low sodium heart healthy   Complete by: As directed    Discharge wound care:   Complete by: As directed    See above   Increase activity slowly   Complete by: As directed      Allergies as of 02/01/2021  Reactions   Metformin And Related Cough   Zestril [lisinopril] Cough      Medication List    STOP taking these medications   atenolol 50 MG tablet Commonly known as: TENORMIN   hydrochlorothiazide 25 MG tablet Commonly known as:  HYDRODIURIL   NovoLOG Mix 70/30 FlexPen (70-30) 100 UNIT/ML FlexPen Generic drug: insulin aspart protamine - aspart     TAKE these medications   acetaminophen 325 MG tablet Commonly known as: TYLENOL Take 650 mg by mouth every 6 (six) hours as needed for moderate pain.   atorvastatin 40 MG tablet Commonly known as: LIPITOR Take 40 mg by mouth daily.   diltiazem 180 MG 24 hr capsule Commonly known as: CARDIZEM CD Take 1 capsule (180 mg total) by mouth daily. Start taking on: February 02, 2021   docusate sodium 100 MG capsule Commonly known as: COLACE Take 1 capsule (100 mg total) by mouth 2 (two) times daily.   hydrALAZINE 10 MG tablet Commonly known as: APRESOLINE Take 1 tablet (10 mg total) by mouth every 8 (eight) hours.   losartan 100 MG tablet Commonly known as: COZAAR Take 100 mg by mouth daily.   magnesium oxide 400 (241.3 Mg) MG tablet Commonly known as: MAG-OX Take 0.5 tablets (200 mg total) by mouth 2 (two) times daily.   methocarbamol 500 MG tablet Commonly known as: ROBAXIN Take 1 tablet (500 mg total) by mouth every 8 (eight) hours as needed for muscle spasms.   multivitamin with minerals Tabs tablet Take 1 tablet by mouth daily.   pantoprazole 40 MG tablet Commonly known as: PROTONIX Take 1 tablet (40 mg total) by mouth 2 (two) times daily.   sodium chloride 0.65 % Soln nasal spray Commonly known as: OCEAN Place 1 spray into both nostrils daily as needed for congestion.   Synthroid 100 MCG tablet Generic drug: levothyroxine Take 100 mcg by mouth daily.            Durable Medical Equipment  (From admission, onward)         Start     Ordered   01/25/21 0734  For home use only DME Walker rolling  Once       Question Answer Comment  Walker: With 5 Inch Wheels   Patient needs a walker to treat with the following condition Gait instability      01/25/21 0733           Discharge Care Instructions  (From admission, onward)          Start     Ordered   02/01/21 0000  Discharge wound care:       Comments: See above   02/01/21 1310          Contact information for follow-up providers    Allred, Fayrene Fearing, MD Follow up in 2 week(s).   Specialty: Cardiology Contact information: [address] [place] [address] [phone]            Contact information for after-discharge care    Destination    [facility] Preferred SNF .   Service: Skilled Nursing Contact information: [address] [place] [address] [phone]                 Allergies  Allergen Reactions  . Metformin And Related Cough  . Zestril [Lisinopril] Cough    Consultations:  Cardiology    Procedures/Studies: CT HEAD WO CONTRAST  Result Date: 01/21/2021 CLINICAL DATA:  Recent fall with headaches and facial pain, initial encounter EXAM: CT HEAD WITHOUT CONTRAST CT CERVICAL SPINE WITHOUT CONTRAST TECHNIQUE: Multidetector CT imaging of the head and cervical spine was performed following the standard protocol without intravenous contrast. Multiplanar CT image reconstructions of the cervical spine were also generated. COMPARISON:  None. FINDINGS: CT HEAD FINDINGS Brain: No evidence of acute infarction, hemorrhage, hydrocephalus, extra-axial collection or mass lesion/mass effect. Vascular: No hyperdense vessel or unexpected calcification. Skull: Normal. Negative for fracture or focal lesion. Sinuses/Orbits: No acute finding. Other: Mild scalp hematoma is noted in the left posterior parietal region related to the recent injury. CT CERVICAL SPINE FINDINGS Alignment: Mild straightening of the normal cervical lordosis is noted likely related to muscular spasm. Skull base and vertebrae: 7 cervical segments are well visualized. Vertebral body height is well maintained. Mild osteophytic changes are noted primarily at C6-7 anteriorly. No acute fracture or acute facet abnormality is noted. The  odontoid is within normal limits. Soft tissues and spinal canal: There is a 16 by 25 mm mildly hyperdense nodule identified in the posterior aspect of the left parotid gland. This was present on prior CT of the neck from 08/25/2020 and is somewhat smaller in size when compared with the prior exam. Upper chest: Visualized lung apices are within normal limits. Other: None IMPRESSION: CT of the head: Mild scalp hematoma in the left posterior parietal region. No acute intracranial abnormality noted. CT of the cervical spine: Mild degenerative change of the cervical spine without acute bony abnormality. Left parotid lesion somewhat smaller and less heterogeneous when compared with the prior exam as described. ENT referral is again recommended if this has not been performed. Electronically Signed   By: Alcide Clever M.D.   On: 01/21/2021 14:43   CT CHEST W CONTRAST  Result Date: 01/21/2021 CLINICAL DATA:  Larey Seat. Left rib fractures on a portable chest earlier today. Abdominal trauma. EXAM: CT CHEST, ABDOMEN, AND PELVIS WITH CONTRAST TECHNIQUE: Multidetector CT imaging of the chest, abdomen and pelvis was performed following the standard protocol during bolus administration of intravenous contrast. CONTRAST:  OMNIPAQUE IOHEXOL 300 MG/ML  SOLN COMPARISON:  Portable chest obtained earlier today. FINDINGS: CT CHEST FINDINGS Cardiovascular: Atheromatous calcifications, including the coronary arteries and aorta. Normal sized heart. Minimal pericardial effusion with a maximum thickness of 7 mm. Mediastinum/Nodes: Very small thyroid gland or thyroid remnant. No enlarged lymph nodes. Unremarkable esophagus. Lungs/Pleura: Minimal bilateral dependent atelectasis. Otherwise, clear lungs. No pneumothorax or pleural fluid. Musculoskeletal: Left posterolateral acute 4th through 8th rib fractures. Varying degrees of displacement. No spinal fractures or subluxations. The sternum is intact. Thoracic and lower cervical spine  degenerative changes. Diffuse osteopenia. CT ABDOMEN PELVIS FINDINGS Hepatobiliary: No focal liver abnormality is seen. No gallstones, gallbladder wall thickening, or biliary dilatation. Pancreas: Unremarkable. No pancreatic ductal dilatation or surrounding inflammatory changes. Spleen: Normal in size without focal abnormality. Adrenals/Urinary Tract: Normal appearing adrenal glands. Small bilateral renal cysts. Unremarkable ureters and urinary bladder. Stomach/Bowel: Moderate diffuse low density wall thickening involving the gastric antrum, pylorus, duodenal bulb and 2nd portion of the duodenum. Minimal adjacent soft tissue stranding. Moderate to marked luminal narrowing. Unremarkable colon and appendix. Vascular/Lymphatic: Atheromatous arterial calcifications without aneurysm. No enlarged lymph nodes. Reproductive: Status post hysterectomy. No adnexal masses. Other: Small umbilical hernia containing fat. Multiple small supraumbilical and infraumbilical ventral hernias containing fat. Musculoskeletal: Lumbar spine degenerative changes. These include facet degenerative changes with associated grade 1 anterolisthesis at the L4-5 level. No fractures, subluxations or dislocations. IMPRESSION: 1.  Left posterolateral acute 4th through 8th rib fractures without pneumothorax. 2. Moderate diffuse low density wall thickening involving the gastric antrum, pylorus, duodenal bulb and 2nd portion of the duodenum with associated moderate to marked luminal narrowing. This is most likely due to gastritis and duodenitis. 3. Minimal pericardial effusion. 4. Multiple small supraumbilical and infraumbilical ventral hernias containing fat. 5. Calcific coronary artery and aortic atherosclerosis. Aortic Atherosclerosis (ICD10-I70.0). Electronically Signed   By: Beckie Salts M.D.   On: 01/21/2021 16:43   CT CERVICAL SPINE WO CONTRAST  Result Date: 01/21/2021 CLINICAL DATA:  Recent fall with headaches and facial pain, initial encounter  EXAM: CT HEAD WITHOUT CONTRAST CT CERVICAL SPINE WITHOUT CONTRAST TECHNIQUE: Multidetector CT imaging of the head and cervical spine was performed following the standard protocol without intravenous contrast. Multiplanar CT image reconstructions of the cervical spine were also generated. COMPARISON:  None. FINDINGS: CT HEAD FINDINGS Brain: No evidence of acute infarction, hemorrhage, hydrocephalus, extra-axial collection or mass lesion/mass effect. Vascular: No hyperdense vessel or unexpected calcification. Skull: Normal. Negative for fracture or focal lesion. Sinuses/Orbits: No acute finding. Other: Mild scalp hematoma is noted in the left posterior parietal region related to the recent injury. CT CERVICAL SPINE FINDINGS Alignment: Mild straightening of the normal cervical lordosis is noted likely related to muscular spasm. Skull base and vertebrae: 7 cervical segments are well visualized. Vertebral body height is well maintained. Mild osteophytic changes are noted primarily at C6-7 anteriorly. No acute fracture or acute facet abnormality is noted. The odontoid is within normal limits. Soft tissues and spinal canal: There is a 16 by 25 mm mildly hyperdense nodule identified in the posterior aspect of the left parotid gland. This was present on prior CT of the neck from 08/25/2020 and is somewhat smaller in size when compared with the prior exam. Upper chest: Visualized lung apices are within normal limits. Other: None IMPRESSION: CT of the head: Mild scalp hematoma in the left posterior parietal region. No acute intracranial abnormality noted. CT of the cervical spine: Mild degenerative change of the cervical spine without acute bony abnormality. Left parotid lesion somewhat smaller and less heterogeneous when compared with the prior exam as described. ENT referral is again recommended if this has not been performed. Electronically Signed   By: Alcide Clever M.D.   On: 01/21/2021 14:43   CT ABDOMEN PELVIS W  CONTRAST  Result Date: 01/21/2021 CLINICAL DATA:  Larey Seat. Left rib fractures on a portable chest earlier today. Abdominal trauma. EXAM: CT CHEST, ABDOMEN, AND PELVIS WITH CONTRAST TECHNIQUE: Multidetector CT imaging of the chest, abdomen and pelvis was performed following the standard protocol during bolus administration of intravenous contrast. CONTRAST:  OMNIPAQUE IOHEXOL 300 MG/ML  SOLN COMPARISON:  Portable chest obtained earlier today. FINDINGS: CT CHEST FINDINGS Cardiovascular: Atheromatous calcifications, including the coronary arteries and aorta. Normal sized heart. Minimal pericardial effusion with a maximum thickness of 7 mm. Mediastinum/Nodes: Very small thyroid gland or thyroid remnant. No enlarged lymph nodes. Unremarkable esophagus. Lungs/Pleura: Minimal bilateral dependent atelectasis. Otherwise, clear lungs. No pneumothorax or pleural fluid. Musculoskeletal: Left posterolateral acute 4th through 8th rib fractures. Varying degrees of displacement. No spinal fractures or subluxations. The sternum is intact. Thoracic and lower cervical spine degenerative changes. Diffuse osteopenia. CT ABDOMEN PELVIS FINDINGS Hepatobiliary: No focal liver abnormality is seen. No gallstones, gallbladder wall thickening, or biliary dilatation. Pancreas: Unremarkable. No pancreatic ductal dilatation or surrounding inflammatory changes. Spleen: Normal in size without focal abnormality. Adrenals/Urinary Tract: Normal appearing adrenal glands. Small bilateral  renal cysts. Unremarkable ureters and urinary bladder. Stomach/Bowel: Moderate diffuse low density wall thickening involving the gastric antrum, pylorus, duodenal bulb and 2nd portion of the duodenum. Minimal adjacent soft tissue stranding. Moderate to marked luminal narrowing. Unremarkable colon and appendix. Vascular/Lymphatic: Atheromatous arterial calcifications without aneurysm. No enlarged lymph nodes. Reproductive: Status post hysterectomy. No adnexal masses.  Other: Small umbilical hernia containing fat. Multiple small supraumbilical and infraumbilical ventral hernias containing fat. Musculoskeletal: Lumbar spine degenerative changes. These include facet degenerative changes with associated grade 1 anterolisthesis at the L4-5 level. No fractures, subluxations or dislocations. IMPRESSION: 1. Left posterolateral acute 4th through 8th rib fractures without pneumothorax. 2. Moderate diffuse low density wall thickening involving the gastric antrum, pylorus, duodenal bulb and 2nd portion of the duodenum with associated moderate to marked luminal narrowing. This is most likely due to gastritis and duodenitis. 3. Minimal pericardial effusion. 4. Multiple small supraumbilical and infraumbilical ventral hernias containing fat. 5. Calcific coronary artery and aortic atherosclerosis. Aortic Atherosclerosis (ICD10-I70.0). Electronically Signed   By: Beckie Salts M.D.   On: 01/21/2021 16:43   DG Chest Port 1 View  Result Date: 01/21/2021 CLINICAL DATA:  Altered mental status following a fall today. EXAM: PORTABLE CHEST 1 VIEW COMPARISON:  None. FINDINGS: Normal sized heart. Mildly tortuous and minimally calcified thoracic aorta. Minimal linear atelectasis or scarring in the left lower lung zone. Otherwise clear lungs with normal vascularity. Mildly displaced left 4th posterior rib fracture. Minimally displaced left 6th and 7th posterior rib fractures. No pneumothorax. IMPRESSION: Left 4th, 6th and 7th posterior rib fractures without pneumothorax. Electronically Signed   By: Beckie Salts M.D.   On: 01/21/2021 11:45   ECHOCARDIOGRAM COMPLETE  Result Date: 01/25/2021    ECHOCARDIOGRAM REPORT   Patient Name:   MIN TUNNELL Date of Exam: 01/25/2021 Medical Rec #:  [id number]       Height:       66.0 in Accession #:    [id number]      Weight:       180.6 lb Date of Birth:  1956-10-01        BSA:          1.915 m Patient Age:    64 years        BP:           139/74 mmHg Patient Gender:  F               HR:           59 bpm. Exam Location:  Inpatient Procedure: 2D Echo, Cardiac Doppler and Color Doppler Indications:    R55 Syncope  History:        Patient has no prior history of Echocardiogram examinations.  Sonographer:    Roosvelt Maser RDCS Referring Phys: [phone] [facility] IMPRESSIONS  1. Left ventricular ejection fraction, by estimation, is 60 to 65%. The left ventricle has normal function. The left ventricle has no regional wall motion abnormalities. There is mild concentric left ventricular hypertrophy. Left ventricular diastolic parameters are consistent with Grade I diastolic dysfunction (impaired relaxation).  2. Right ventricular systolic function is normal. The right ventricular size is normal.  3. The mitral valve is normal in structure. Trivial mitral valve regurgitation.  4. The aortic valve is tricuspid. There is mild thickening of the aortic valve. Aortic valve regurgitation is not visualized. Mild aortic valve sclerosis is present, with no evidence of aortic valve stenosis.  5. The inferior vena cava is normal in size  with <50% respiratory variability, suggesting right atrial pressure of 8 mmHg. Comparison(s): No prior Echocardiogram. FINDINGS  Left Ventricle: Left ventricular ejection fraction, by estimation, is 60 to 65%. The left ventricle has normal function. The left ventricle has no regional wall motion abnormalities. The left ventricular internal cavity size was normal in size. There is  mild concentric left ventricular hypertrophy. Left ventricular diastolic parameters are consistent with Grade I diastolic dysfunction (impaired relaxation). Right Ventricle: The right ventricular size is normal. No increase in right ventricular wall thickness. Right ventricular systolic function is normal. Left Atrium: Left atrial size was normal in size. Right Atrium: Right atrial size was normal in size. Pericardium: There is no evidence of pericardial effusion. Mitral Valve: The mitral  valve is normal in structure. Mild mitral annular calcification. Trivial mitral valve regurgitation. Tricuspid Valve: The tricuspid valve is normal in structure. Tricuspid valve regurgitation is trivial. Aortic Valve: The aortic valve is tricuspid. There is mild thickening of the aortic valve. Aortic valve regurgitation is not visualized. Mild aortic valve sclerosis is present, with no evidence of aortic valve stenosis. Aortic valve mean gradient measures 4.0 mmHg. Aortic valve peak gradient measures 7.5 mmHg. Pulmonic Valve: The pulmonic valve was normal in structure. Pulmonic valve regurgitation is trivial. Aorta: The aortic root and ascending aorta are structurally normal, with no evidence of dilitation. Venous: The inferior vena cava is normal in size with less than 50% respiratory variability, suggesting right atrial pressure of 8 mmHg. IAS/Shunts: No atrial level shunt detected by color flow Doppler.  LEFT VENTRICLE PLAX 2D LVIDd:         4.50 cm Diastology LVIDs:         3.00 cm LV e' medial:    9.36 cm/s LV PW:         0.90 cm LV E/e' medial:  7.6 LV IVS:        0.90 cm LV e' lateral:   6.20 cm/s                        LV E/e' lateral: 11.4  RIGHT VENTRICLE          IVC RV Basal diam:  3.60 cm  IVC diam: 1.50 cm LEFT ATRIUM             Index       RIGHT ATRIUM           Index LA diam:        3.40 cm 1.78 cm/m  RA Area:     25.65 cm LA Vol (A2C):   72.8 ml 38.02 ml/m RA Volume:   82.40 ml  43.03 ml/m LA Vol (A4C):   77.9 ml 40.68 ml/m LA Biplane Vol: 76.7 ml 40.06 ml/m  AORTIC VALVE AV Vmax:           137.00 cm/s AV Vmean:          93.400 cm/s AV VTI:            0.269 m AV Peak Grad:      7.5 mmHg AV Mean Grad:      4.0 mmHg LVOT Vmax:         113.00 cm/s LVOT Vmean:        67.300 cm/s LVOT VTI:          0.224 m LVOT/AV VTI ratio: 0.83  AORTA Ao Root diam: 2.80 cm Ao Asc diam:  3.10 cm MITRAL VALVE  TRICUSPID VALVE MV Area (PHT): 3.77 cm    TR Peak grad:   37.0 mmHg MV Decel Time: 201 msec     TR Vmax:        304.00 cm/s MV E velocity: 70.70 cm/s MV A velocity: 79.30 cm/s  SHUNTS MV E/A ratio:  0.89        Systemic VTI: 0.22 m Laurance Flatten MD Electronically signed by Laurance Flatten MD Signature Date/Time: 01/25/2021/4:48:27 PM    Final       Subjective: No new complaints. She is feeling well.   Discharge Exam: Vitals:   02/01/21 0744 02/01/21 1309  BP: (!) 146/79 127/68  Pulse: 78   Resp: 18   Temp: 97.7 F (36.5 C)   SpO2: 98%      General: Pt is alert, awake, not in acute distress Cardiovascular: RRR, S1/S2 +, no rubs, no gallops Respiratory: CTA bilaterally, no wheezing, no rhonchi Abdominal: Soft, NT, ND, bowel sounds + Extremities: no edema, no cyanosis    The results of significant diagnostics from this hospitalization (including imaging, microbiology, ancillary and laboratory) are listed below for reference.     Microbiology: Recent Results (from the past 240 hour(s))  Culture, blood (Routine X 2) w Reflex to ID Panel     Status: None   Collection Time: 01/23/21  1:24 PM   Specimen: BLOOD LEFT HAND  Result Value Ref Range Status   Specimen Description BLOOD LEFT HAND  Final   Special Requests   Final    BOTTLES DRAWN AEROBIC AND ANAEROBIC Blood Culture results may not be optimal due to an inadequate volume of blood received in culture bottles   Culture   Final    NO GROWTH 5 DAYS Performed at [facility], [address], [place], [place] [address]    Report Status 01/28/2021 FINAL  Final  Culture, blood (Routine X 2) w Reflex to ID Panel     Status: None   Collection Time: 01/23/21  1:24 PM   Specimen: BLOOD RIGHT HAND  Result Value Ref Range Status   Specimen Description BLOOD RIGHT HAND  Final   Special Requests   Final    BOTTLES DRAWN AEROBIC AND ANAEROBIC Blood Culture results may not be optimal due to an inadequate volume of blood received in culture bottles   Culture   Final    NO GROWTH 5 DAYS Performed at [facility], [address] [place], [place] [address]    Report Status 01/28/2021 FINAL  Final  SARS CORONAVIRUS 2 (TAT 6-24 HRS) Nasopharyngeal Nasopharyngeal Swab     Status: None   Collection Time: 01/31/21 10:52 PM   Specimen: Nasopharyngeal Swab  Result Value Ref Range Status   SARS Coronavirus 2 NEGATIVE NEGATIVE Final    Comment: (NOTE) SARS-CoV-2 target nucleic acids are NOT DETECTED.  The SARS-CoV-2 RNA is generally detectable in upper and lower respiratory specimens during the acute phase of infection. Negative results do not preclude SARS-CoV-2 infection, do not rule out co-infections with other pathogens, and should not be used as the sole basis for treatment or other patient management decisions. Negative results must be combined with clinical observations, patient history, and epidemiological information. The expected result is Negative.  Fact Sheet for Patients: HairSlick.no  Fact Sheet for Healthcare Providers: quierodirigir.com  This test is not yet approved or cleared by the [place] FDA and  has been authorized for detection and/or diagnosis of SARS-CoV-2 by FDA under an Emergency Use  Authorization (EUA). This EUA will remain  in effect (meaning this test can be used) for the duration of the COVID-19 declaration under Se ction 564(b)(1) of the Act, 21 U.S.C. section 360bbb-3(b)(1), unless the authorization is terminated or revoked sooner.  Performed at [facility], [address] [place], [place] [address]      Labs: BNP (last 3 results) No results for input(s): BNP in the last 8760 hours. Basic Metabolic Panel: Recent Labs  Lab 01/26/21 0101 01/27/21 0229 01/28/21 0221 01/29/21 0217 01/30/21 0208  NA 135 134* 136 134* 134*  K 3.3* 3.7 3.5 4.2 3.8  CL 95* 98 101 102 101  CO2 33* GLUCOSE 122* 105* 98 105* 119*  BUN <5* <5* 5* 7* 6*  CREATININE 0.50 0.46 0.55 0.68 0.66   CALCIUM 8.1* 8.3* 8.8* 8.9 9.2  MG 1.6* 2.1 1.6* 1.7 1.8   Liver Function Tests: Recent Labs  Lab 01/27/21 0229  AST 115*  ALT 107*  ALKPHOS 59  BILITOT 0.5  PROT 5.1*  ALBUMIN 2.7*   No results for input(s): LIPASE, AMYLASE in the last 168 hours. No results for input(s): AMMONIA in the last 168 hours. CBC: Recent Labs  Lab 01/30/21 0208  WBC 7.4  HGB 13.0  HCT 38.2  MCV 95.5  PLT 273   Cardiac Enzymes: Recent Labs  Lab 01/26/21 0101 01/27/21 0229 01/28/21 0221 01/29/21 0217  CKTOTAL 1,423* 1,042* 768* 476*   BNP: Invalid input(s): POCBNP CBG: Recent Labs  Lab 01/31/21 0737 01/31/21 1649 01/31/21 2135 02/01/21 0802 02/01/21 1131  GLUCAP 116* 93 107* 118* 142*   D-Dimer No results for input(s): DDIMER in the last 72 hours. Hgb A1c No results for input(s): HGBA1C in the last 72 hours. Lipid Profile No results for input(s): CHOL, HDL, LDLCALC, TRIG, CHOLHDL, LDLDIRECT in the last 72 hours. Thyroid function studies No results for input(s): TSH, T4TOTAL, T3FREE, THYROIDAB in the last 72 hours.  Invalid input(s): FREET3 Anemia work up No results for input(s): VITAMINB12, FOLATE, FERRITIN, TIBC, IRON, RETICCTPCT in the last 72 hours. Urinalysis    Component Value Date/Time   COLORURINE AMBER (A) 01/21/2021 1310   APPEARANCEUR CLOUDY (A) 01/21/2021 1310   LABSPEC 1.020 01/21/2021 1310   PHURINE 5.0 01/21/2021 1310   GLUCOSEU NEGATIVE 01/21/2021 1310   HGBUR LARGE (A) 01/21/2021 1310   BILIRUBINUR NEGATIVE 01/21/2021 1310   KETONESUR 5 (A) 01/21/2021 1310   PROTEINUR 100 (A) 01/21/2021 1310   NITRITE NEGATIVE 01/21/2021 1310   LEUKOCYTESUR TRACE (A) 01/21/2021 1310   Sepsis Labs Invalid input(s): PROCALCITONIN,  WBC,  LACTICIDVEN Microbiology Recent Results (from the past 240 hour(s))  Culture, blood (Routine X 2) w Reflex to ID Panel     Status: None   Collection Time: 01/23/21  1:24 PM   Specimen: BLOOD LEFT HAND  Result Value Ref Range  Status   Specimen Description BLOOD LEFT HAND  Final   Special Requests   Final    BOTTLES DRAWN AEROBIC AND ANAEROBIC Blood Culture results may not be optimal due to an inadequate volume of blood received in culture bottles   Culture   Final    NO GROWTH 5 DAYS Performed at [facility], [address], [place], [place] [address]    Report Status 01/28/2021 FINAL  Final  Culture, blood (Routine X 2) w Reflex to ID Panel     Status: None   Collection Time: 01/23/21  1:24 PM  Specimen: BLOOD RIGHT HAND  Result Value Ref Range Status   Specimen Description BLOOD RIGHT HAND  Final   Special Requests   Final    BOTTLES DRAWN AEROBIC AND ANAEROBIC Blood Culture results may not be optimal due to an inadequate volume of blood received in culture bottles   Culture   Final    NO GROWTH 5 DAYS Performed at [facility], [address] [place], [place] [address]    Report Status 01/28/2021 FINAL  Final  SARS CORONAVIRUS 2 (TAT 6-24 HRS) Nasopharyngeal Nasopharyngeal Swab     Status: None   Collection Time: 01/31/21 10:52 PM   Specimen: Nasopharyngeal Swab  Result Value Ref Range Status   SARS Coronavirus 2 NEGATIVE NEGATIVE Final    Comment: (NOTE) SARS-CoV-2 target nucleic acids are NOT DETECTED.  The SARS-CoV-2 RNA is generally detectable in upper and lower respiratory specimens during the acute phase of infection. Negative results do not preclude SARS-CoV-2 infection, do not rule out co-infections with other pathogens, and should not be used as the sole basis for treatment or other patient management decisions. Negative results must be combined with clinical observations, patient history, and epidemiological information. The expected result is Negative.  Fact Sheet for Patients: HairSlick.no  Fact Sheet for Healthcare Providers: quierodirigir.com  This test is not yet approved or cleared by the [place] FDA  and  has been authorized for detection and/or diagnosis of SARS-CoV-2 by FDA under an Emergency Use Authorization (EUA). This EUA will remain  in effect (meaning this test can be used) for the duration of the COVID-19 declaration under Se ction 564(b)(1) of the Act, 21 U.S.C. section 360bbb-3(b)(1), unless the authorization is terminated or revoked sooner.  Performed at [facility], [address] [place], [place] [address]      Time coordinating discharge: 40 minutes  SIGNED:   Alba Cory, MD  [facility]

## 2021-02-01 NOTE — Progress Notes (Signed)
Occupational Therapy Treatment Patient Details Name: Kristina Gates MRN: [id number] DOB: June 16, 1956 Today's Date: 02/01/2021    History of present illness Pt is a 65 y.o. female admitted 01/21/21 after being found down at home for unknown time (sister had not heard from her for 2 days). Workup for UTI, urosepsis, AKI, rhabdomyolysis, multiple L rib fxs. Head CT negative for acute abnormality. Episode of SVT 3/8. PMH includes HTN, DM2.   OT comments  Pt making steady progress towards OT goals this session. Pt continues to present with increased pain ( L flank d/t rib fractures), decreased activity tolerance and generalized weakness impacting pts ability to complete BADLs independently. Pt currently requires set- up for UB ADLs and min guard for functional mobility with RW. Pt completed therex as indicated below to increase strength and overall activity tolerance as precursor to higher level functional mobility tasks. CIR signed off, therefore DC recs updated to reflect change in POC as pt lives alone and would benefit from ST SNF placement to improve overall functional mobility and facilitate independence with BADL participation, will let OTR know about change in POC. Will continue to follow acutely per POC for OT needs.     Follow Up Recommendations  SNF    Equipment Recommendations  Tub/shower seat    Recommendations for Other Services      Precautions / Restrictions Precautions Precautions: Fall;Other (comment) Precaution Comments: L rib fxs Restrictions Weight Bearing Restrictions: No       Mobility Bed Mobility               General bed mobility comments: pt OOB in recliner upon arrival and returned to recliner at end of sesssion    Transfers Overall transfer level: Needs assistance Equipment used: None Transfers: Sit to/from Stand Sit to Stand: Min guard         General transfer comment: min guard from recliner with pt needing steadying assist upon initial  stand    Balance Overall balance assessment: Needs assistance Sitting-balance support: Feet supported;No upper extremity supported Sitting balance-Leahy Scale: Good     Standing balance support: Bilateral upper extremity supported;During functional activity Standing balance-Leahy Scale: Poor Standing balance comment: Can static stand and ambulate without UE support                         ADL either performed or assessed with clinical judgement   ADL Overall ADL's : Needs assistance/impaired                 Upper Body Dressing : Set up;Standing Upper Body Dressing Details (indicate cue type and reason): to don robe in standing     Toilet Transfer: Min IT sales professional Details (indicate cue type and reason): simulated via functional mobility with RW and min guard assist for safety         Functional mobility during ADLs: Min guard;Rolling walker General ADL Comments: pt making improvements with activity tolerance with pt able to complete functional mobility greater than a household distance with RW and min guard assist as well as seated BUE/ BLE therex to increase overall strength and endurance for higher level BADLs and functional mobility tasks     Vision       Perception     Praxis      Cognition Arousal/Alertness: Awake/alert Behavior During Therapy: WFL for tasks assessed/performed Overall Cognitive Status: Impaired/Different from baseline Area of Impairment: Problem solving;Safety/judgement;Following commands  Current Attention Level: Selective;Alternating   Following Commands: Follows one step commands with increased time Safety/Judgement: Decreased awareness of deficits Awareness: Emergent Problem Solving: Slow processing;Requires verbal cues General Comments: pt overall slow to process but following commands with increased time, pt lacking awareness into deficits        Exercises General  Exercises - Upper Extremity Shoulder Flexion: AROM;Both;10 reps;Seated;Theraband (decreased ROM on LUE d/t rib fxts) Theraband Level (Shoulder Flexion): Level 2 (Red) Elbow Flexion: AROM;Both;10 reps;Theraband;Seated Theraband Level (Elbow Flexion): Level 2 (Red) Elbow Extension: AROM;Both;10 reps;Seated;Theraband Theraband Level (Elbow Extension): Level 2 (Red) Other Exercises Other Exercises: LAQ from recliner x20 reps BLEs Other Exercises: seated marches x20 reps BLEs from recliner   Shoulder Instructions       General Comments VSS    Pertinent Vitals/ Pain       Pain Assessment: Faces Faces Pain Scale: Hurts a little bit Pain Location: L-side ribs with therex Pain Descriptors / Indicators: Discomfort;Sore Pain Intervention(s): Limited activity within patient's tolerance;Monitored during session;Other (comment) (provided compensatory methods during therex as pain mgmt strategy)  Home Living                                          Prior Functioning/Environment              Frequency  Min 2X/week        Progress Toward Goals  OT Goals(current goals can now be found in the care plan section)  Progress towards OT goals: Progressing toward goals  Acute Rehab OT Goals Patient Stated Goal: I want to get more rehab before I got home because I live alone. OT Goal Formulation: With patient Time For Goal Achievement: 02/11/21 Potential to Achieve Goals: Good  Plan Discharge plan needs to be updated;Frequency needs to be updated    Co-evaluation                 AM-PAC OT "6 Clicks" Daily Activity     Outcome Measure   Help from another person eating meals?: None Help from another person taking care of personal grooming?: A Little Help from another person toileting, which includes using toliet, bedpan, or urinal?: A Little Help from another person bathing (including washing, rinsing, drying)?: A Little Help from another person to put on  and taking off regular upper body clothing?: None Help from another person to put on and taking off regular lower body clothing?: A Little 6 Click Score: 20    End of Session Equipment Utilized During Treatment: Rolling walker  OT Visit Diagnosis: Unsteadiness on feet (R26.81);Muscle weakness (generalized) (M62.81);History of falling (Z91.81);Other symptoms and signs involving cognitive function;Dizziness and giddiness (R42);Pain   Activity Tolerance Patient tolerated treatment well   Patient Left in chair;with call bell/phone within reach;with chair alarm set   Nurse Communication Mobility status        Time: 4599-7741 OT Time Calculation (min): 19 min  Charges: OT General Charges $OT Visit: 1 Visit OT Treatments $Therapeutic Activity: 8-22 mins  Lenor Derrick., COTA/L Acute Rehabilitation Services [phone]   Barron Schmid 02/01/2021, 3:59 PM

## 2021-02-01 NOTE — TOC Transition Note (Signed)
Transition of Care [facility]) - CM/SW Discharge Note   Patient Details  Name: PRISILLA KOCSIS MRN: [id number] Date of Birth: August 26, 1956  Transition of Care [facility]) CM/SW Contact:  Terrial Rhodes, LCSWA Phone Number: 02/01/2021, 2:22 PM   Clinical Narrative:     Patient will DC to: [facility]   Anticipated DC date: 02/01/2021  Family notified: Burney Gauze  Transport by: Sharin Mons  ?  Per MD patient ready for DC to [facility] . RN, patient, patient's family, and facility notified of DC. Discharge Summary sent to facility. RN given number for report tele#[phone] RM#[phone]. DC packet on chart. Ambulance transport requested for patient.  CSW signing off.    Final next level of care: Skilled Nursing Facility Barriers to Discharge: No Barriers Identified   Patient Goals and CMS Choice Patient states their goals for this hospitalization and ongoing recovery are:: to go to SNF CMS Medicare.gov Compare Post Acute Care list provided to:: Patient Choice offered to / list presented to : Patient  Discharge Placement              Patient chooses bed at: [facility] Patient to be transferred to facility by: [facility] Name of family member notified: Shai Patient and family notified of of transfer: 02/01/21  Discharge Plan and Services                                     Social Determinants of Health (SDOH) Interventions     Readmission Risk Interventions No flowsheet data found.

## 2021-02-01 NOTE — Care Management Important Message (Signed)
Important Message  Patient Details  Name: Kristina Gates MRN: [id number] Date of Birth: [birth year]   Medicare Important Message Given:  Yes     Renie Ora 02/01/2021, 10:23 AM

## 2021-02-01 NOTE — Progress Notes (Signed)
Report given to receiving facility [facility]'s RN

## 2021-02-01 NOTE — Progress Notes (Signed)
Physical Therapy Treatment Patient Details Name: Kristina Gates MRN: [id number] DOB: 1956/06/08 Today's Date: 02/01/2021    History of Present Illness Pt is a 65 y.o. female admitted 01/21/21 after being found down at home for unknown time (sister had not heard from her for 2 days). Workup for UTI, urosepsis, AKI, rhabdomyolysis, multiple L rib fxs. Head CT negative for acute abnormality. Episode of SVT 3/8. PMH includes HTN, DM2.   PT Comments    Pt progressing well with mobility. Today's session focused on gait training and higher level balance tasks. Pt still requiring intermittent min guard and UE support to maintain balance with activity; demonstrates improving cognition, but still with impairments related to problem solving and awareness. Increased time discussing d/c recommendations and potential for pt to return home with sister's support; pt still does not feel safe returning home where she lives alone. Therefore, continue to recommend SNF-level therapies to maximize functional mobility and independence.    Follow Up Recommendations  SNF;Supervision for mobility/OOB     Equipment Recommendations  Rolling walker with 5" wheels    Recommendations for Other Services       Precautions / Restrictions Precautions Precautions: Fall;Other (comment) Precaution Comments: L rib fxs    Mobility  Bed Mobility               General bed mobility comments: Received sitting in recliner    Transfers Overall transfer level: Needs assistance Equipment used: Rolling walker (2 wheeled);None Transfers: Sit to/from Stand Sit to Stand: Supervision;Min guard         General transfer comment: Sit<>stand with RW and supervision for safety; min guard sit<>stand without DME  Ambulation/Gait Ambulation/Gait assistance: Min guard Gait Distance (Feet): 120 Feet (+140) Assistive device: Rolling walker (2 wheeled);None Gait Pattern/deviations: Step-through pattern;Decreased stride  length Gait velocity: Decreased   General Gait Details: Pt initially declining gait training without DME, preferring to use RW initially for hallway ambulation, demonstrates improved sequencing, gait speed and stability with supervision for safety; additional gait trial without DME, min guard for balance, pt with slowed, guarded gait speed requiring cues for increased speed and step length   Stairs             Wheelchair Mobility    Modified Rankin (Stroke Patients Only)       Balance Overall balance assessment: Needs assistance Sitting-balance support: Feet supported;No upper extremity supported Sitting balance-Leahy Scale: Good       Standing balance-Leahy Scale: Fair Standing balance comment: Can static stand and ambulate without UE support             High level balance activites: Side stepping;Backward walking;Direction changes;Turns;Sudden stops;Head turns High Level Balance Comments: Pt required UE support and min guard to pick up object from floor, cues for sequencing and safety; increased guarded posture and gait with higher level balance tasks without DME            Cognition Arousal/Alertness: Awake/alert Behavior During Therapy: WFL for tasks assessed/performed Overall Cognitive Status: Impaired/Different from baseline Area of Impairment: Attention;Safety/judgement;Awareness;Problem solving                   Current Attention Level: Selective;Alternating     Safety/Judgement: Decreased awareness of deficits Awareness: Emergent Problem Solving: Slow processing;Requires verbal cues General Comments: Improved processing and problem solving compared to prior sessions, but still with apparent impairments. Pt aware she lacks confidence requiring mobility, requires increased encouragement      Exercises  General Comments        Pertinent Vitals/Pain Pain Assessment: Faces Faces Pain Scale: Hurts a little bit Pain Location: L-side  ribs Pain Descriptors / Indicators: Discomfort;Sore Pain Intervention(s): Monitored during session    Home Living                      Prior Function            PT Goals (current goals can now be found in the care plan section) Progress towards PT goals: Progressing toward goals    Frequency    Min 3X/week      PT Plan Current plan remains appropriate    Co-evaluation              AM-PAC PT "6 Clicks" Mobility   Outcome Measure  Help needed turning from your back to your side while in a flat bed without using bedrails?: None Help needed moving from lying on your back to sitting on the side of a flat bed without using bedrails?: None Help needed moving to and from a bed to a chair (including a wheelchair)?: A Little Help needed standing up from a chair using your arms (e.g., wheelchair or bedside chair)?: A Little Help needed to walk in hospital room?: A Little Help needed climbing 3-5 steps with a railing? : A Little 6 Click Score: 20    End of Session Equipment Utilized During Treatment: Gait belt Activity Tolerance: Patient tolerated treatment well Patient left: in chair;with call bell/phone within reach;with chair alarm set Nurse Communication: Mobility status PT Visit Diagnosis: Muscle weakness (generalized) (M62.81);Difficulty in walking, not elsewhere classified (R26.2)     Time: 6160-7371 PT Time Calculation (min) (ACUTE ONLY): 23 min  Charges:  $Gait Training: 8-22 mins $Neuromuscular Re-education: 8-22 mins                     Ina Homes, PT, DPT Acute Rehabilitation Services  Pager [phone] Office [phone]  Malachy Chamber 02/01/2021, 1:04 PM

## 2021-02-01 NOTE — Discharge Instructions (Signed)
Supraventricular Tachycardia, Adult Supraventricular tachycardia (SVT) is a kind of abnormal heartbeat. It makes your heart beat very fast. This may last for a short time and then return to normal, or it may last longer. A normal resting heartbeat is 60-100 times a minute. This condition can make your heart beat more than 150 times a minute. Times of having a fast heartbeat (episodes) can be scary, but they are usually not dangerous. In some cases, they may lead to heart failure if they:  Happen many times a day.  Last longer than a few seconds. What are the causes? This condition happens when electrical signals are sent out from areas of the heart that do not normally send signals for the heartbeat.   What increases the risk? You are more likely to develop this condition if you are:  Middle aged or younger.  Female. The following factors may also make you more likely to develop this condition:  Stress.  Feeling worried or nervous (anxiety).  Tiredness.  Smoking.  Stimulant drugs, such as cocaine and methamphetamine.  Alcohol.  Caffeine.  Pregnancy.  Having certain medical conditions. What are the signs or symptoms?  A pounding heart.  A feeling that your heart is skipping beats (palpitations).  Weakness.  Trouble getting enough air.  Pain or tightness in your chest.  Dizziness or feeling like you are going to pass out (faint).  Feeling worried or nervous.  Sweating.  Feeling like you may vomit (nausea).  Passing out.  Tiredness. Sometimes, there are no symptoms. How is this treated? Treatment may include:  Vagal nerve stimulation. Ways to do this include: ? Holding your breath and pushing, as though you are pooping (having a bowel movement). ? Massaging an area on one side of your neck. Do not try this yourself. Only a doctor should do this. If done the wrong way, it can lead to a stroke. ? Bending forward with your head between your legs. ? Coughing  while bending forward with your head between your legs. ? Putting an ice-cold, wet towel on your face.  Medicines that prevent attacks.  Medicine to stop an attack given through an IV tube at the hospital.  A small electric shock (cardioversion) that stops an attack.  A procedure to get rid of cells in the area that is causing the fast heartbeats (radiofrequency ablation). If you do not have symptoms, you may not need treatment. Follow these instructions at home: Stress  Avoid things that make you feel stressed.  To deal with stress, try: ? Doing yoga or meditation. ? Being out in nature. ? Listening to relaxing music. ? Doing deep breathing. ? Taking steps to be healthy, such as getting lots of sleep, exercising, and eating a balanced diet. ? Talking with a mental health doctor. Lifestyle  Try to get at least 7 hours of sleep each night.  Do not smoke or use any products that contain nicotine or tobacco. If you need help quitting, ask your doctor.  Do not drink alcohol if it gives you a fast heartbeat.  If alcohol does not seem to give you a fast heartbeat, limit your alcohol use. If you drink alcohol: ? Limit how much you have to:  0-1 drink a day for women who are not pregnant.  0-2 drinks a day for men. ? Know how much alcohol is in your drink. In the [place] one drink equals one 12 oz bottle of beer (355 mL), one 5 oz glass of wine (148   mL), or one 1 oz glass of hard liquor (44 mL).  Be aware of how caffeine affects you. ? If caffeine gives you a fast heartbeat, do not eat, drink, or use anything with caffeine in it. ? If caffeine does not seem to give you a fast heartbeat, limit how much caffeine you eat, drink, or use.  Do not use stimulant drugs. If you need help quitting, ask your doctor.   General instructions  Stay at a healthy weight.  Exercise regularly. Ask your doctor about good activities for you. Try one or a mixture of these: ? 150 minutes a week of  gentle exercise, like walking or yoga. ? 75 minutes a week of exercise that is very active, like running or swimming.  Do vagus nerve treatments to slow down your heartbeat as told by your doctor.  Take over-the-counter and prescription medicines only as told by your doctor.  Keep all follow-up visits. Contact a doctor if:  You have a fast heartbeat more often.  Times of having a fast heartbeat last longer than before.  Home treatments to slow down your heartbeat do not help.  You have new symptoms. Get help right away if:  You have chest pain.  Your symptoms get worse.  You have trouble breathing.  Your heart beats very fast for more than 20 minutes.  You pass out. These symptoms may be an emergency. Get medical help right away. Call your local emergency services (911 in the [place]  Do not wait to see if the symptoms will go away.  Do not drive yourself to the hospital. Summary  SVT is a type of abnormal heartbeat.  This condition can make your heart beat more than 150 times a minute.  If you do not have symptoms, you may not need treatment. This information is not intended to replace advice given to you by your health care provider. Make sure you discuss any questions you have with your health care provider. Document Revised: 06/20/2020 Document Reviewed: 06/20/2020 Elsevier Patient Education  2021 [employer]  

## 2021-02-01 NOTE — TOC Progression Note (Signed)
Transition of Care [facility]) - Progression Note    Patient Details  Name: Kristina Gates MRN: [id number] Date of Birth: Jan 06, 1956  Transition [facility] Care [facility]) CM/SW Contact  Terrial Rhodes, LCSWA Phone Number: 02/01/2021, 2:05 PM  Clinical Narrative:     [facility] notified CSW that they have received insurance authorization for patient and can accept patient for dc today if medically ready. Patient has SNF bed at [facility].  CSW will continue to follow. CSW will continue to follow.    Expected Discharge Plan: Skilled Nursing Facility Barriers to Discharge: Insurance Authorization,SNF Pending bed offer  Expected Discharge Plan and Services Expected Discharge Plan: Skilled Nursing Facility       Living arrangements for the past 2 months: Single Family Home Expected Discharge Date: 02/01/21                                     Social Determinants of Health (SDOH) Interventions    Readmission Risk Interventions No flowsheet data found.

## 2021-02-01 NOTE — Progress Notes (Signed)
Patient discharged to [facility] via transport team , AAIO x4, in no discomforts, personal  belongings with patient.

## 2021-02-02 ENCOUNTER — Encounter: Payer: Self-pay | Admitting: Internal Medicine

## 2021-02-02 ENCOUNTER — Non-Acute Institutional Stay (SKILLED_NURSING_FACILITY): Payer: Medicare HMO | Admitting: Internal Medicine

## 2021-02-02 DIAGNOSIS — T796XXD Traumatic ischemia of muscle, subsequent encounter: Secondary | ICD-10-CM | POA: Diagnosis not present

## 2021-02-02 DIAGNOSIS — E1129 Type 2 diabetes mellitus with other diabetic kidney complication: Secondary | ICD-10-CM

## 2021-02-02 DIAGNOSIS — I471 Supraventricular tachycardia: Secondary | ICD-10-CM | POA: Diagnosis not present

## 2021-02-02 DIAGNOSIS — R221 Localized swelling, mass and lump, neck: Secondary | ICD-10-CM | POA: Insufficient documentation

## 2021-02-02 NOTE — Assessment & Plan Note (Addendum)
Need for EP F/U as outpatient stressed to patient as tachyarrhythmia may have caused fall.

## 2021-02-02 NOTE — Progress Notes (Signed)
NURSING HOME LOCATION:  [facility]  ROOM NUMBER:  [id number]  CODE STATUS:  Full Code  PCP:  No PCP Dr Talmage Nap, Endocrinologist  This is a comprehensive admission note to this SNFperformed on this date less than 30 days from date of admission. Included are preadmission medical/surgical history; reconciled medication list; family history; social history and comprehensive review of systems.  Corrections and additions to the records were documented. Comprehensive physical exam was also performed. Additionally a clinical summary was entered for each active diagnosis pertinent to this admission in the Problem List to enhance continuity of care.  HPI: She was hospitalized 3/3-3/14/2022 admitted from home , found down at home after her sister had not heard from her for 48 hours.  Police found her on the floor covered in urine when they performed a welfare check. Course was complicated by rhabdomyolysis with CK  of 26,648, AKI, and lactic acidosis in the context of E. coli UTI.  Staph was documented on blood cultures but this was felt to be a contaminant; repeat blood cultures revealed no growth. Altered mental status was evaluated with CT which was negative for acute intracranial bleed.  Incidental finding was a left parotid gland lesion for which outpatient follow-up was recommended.   Imaging did reveal 4 consecutive rib fractures posteriorly without associated pneumothorax. She was treated with sepsis protocol with aggressive fluid resuscitation and initiation of broad-spectrum antibiotics.  There was progressive improvement in her mental status. Course was complicated by two episodes of SVT with heart rate up to 206.  Adenosine was administered and she was started on diltiazem.  There was no recurrence of the SVT after the second episode. Other complications included hypokalemia and hypomagnesemia which were repleted.  Hydrochlorothiazide was held; losartan was initially held  because of the AKI.  Elevated troponin & transaminases were attributed to the elevated CK level. SNF placement was recommended by PT/OT.  Past medical and surgical history: Includes iatrogenic hypothyroidism, essential hypertension, dyslipidemia, and diabetes with renal complications.  There is a history of colon surgery.  Social history: She was an active smoker @ 1/2 ppd prior to admission.  She does not drink alcohol.  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  Family history: Mother had hypertension diabetes and died at 24.  Father had a stroke and may have had mesothelioma and died at 68.   Review of systems: She describes dizziness mainly in the morning upon arising.(Note:  It is for that reason that the NP student checked  Orthostatics. Blood pressure actually rose as did pulse upon standing.  These values were added to original vital signs).  She cannot describe any cardiac or neurologic prodrome prior to the fall.  She has absolutely no idea why she fell.   For 2 years she is had a mass at the left posterior mandibular angle.  She also had bilateral tinnitus and loss of taste.  The sensory deficits have essentially resolved. Rare(1-2 X /month) dysphagia improved is with TUMS.  Constipation is somewhat of a chronic issue.   She describes chronic tingling in her fingers for years. Rib fracture pain has improved.  Constitutional: No fever, significant weight change Eyes: No redness, discharge, pain, vision change ENT/mouth: No nasal congestion, purulent discharge, earache, change in hearing, sore throat  Cardiovascular: No chest pain, palpitations, paroxysmal nocturnal dyspnea, claudication, edema  Respiratory: No cough, sputum production, hemoptysis, DOE, significant  snoring, apnea  Gastrointestinal: No heartburn,  abdominal pain, nausea /vomiting, rectal bleeding, melena, change in bowels Genitourinary: No  dysuria, hematuria, pyuria, incontinence, nocturia despite recent UTI Musculoskeletal: No joint stiffness, joint swelling Dermatologic: No rash, pruritus, change in appearance of skin Neurologic: No  headache, syncope, seizures Psychiatric: No significant anxiety, depression, insomnia, anorexia Endocrine: No change in hair/skin/nails, excessive thirst, excessive hunger, excessive urination  Hematologic/lymphatic: No significant bruising, lymphadenopathy, abnormal bleeding Allergy/immunology: No itchy/watery eyes, significant sneezing, urticaria, angioedema  Physical exam:  Pertinent or positive findings: Dental hygiene is immaculate.  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  There is no associated cervical lymphadenopathy.  The first heart sound is increased.  Breath sounds tend to be decreased.  She has minimal scattered low-grade rhonchi.  Pedal pulses are decreased.  She has trace-1/2+ edema.  Bandages are present over the elbows.  General appearance: Adequately nourished; no acute distress, increased work of breathing is present.   Lymphatic: No lymphadenopathy about the head, neck, axilla. Eyes: No conjunctival inflammation or lid edema is present. There is no scleral icterus. Ears:  External ear exam shows no significant lesions or deformities.   Nose:  External nasal examination shows no deformity or inflammation. Nasal mucosa are pink and moist without lesions, exudates Oral exam: Lips and gums are healthy appearing.There is no oropharyngeal erythema or exudate. Neck:  No thyromegaly, masses, tenderness noted.    Heart:  Normal rate and regular rhythm. S2 normal without gallop, murmur, click, rub.  Lungs:  without wheezes, rales, rubs. Abdomen: Bowel sounds are normal.  Abdomen is soft and nontender with no organomegaly, hernias, masses. GU: Deferred  Extremities:  No cyanosis, clubbing Neurologic exam:Balance, Rhomberg, finger  to nose testing could not be completed due to clinical state Skin: Warm & dry w/o tenting.  See clinical summary under each active problem in the Problem List with associated updated therapeutic plan

## 2021-02-02 NOTE — Patient Instructions (Signed)
See assessment and plan under each diagnosis in the problem list and acutely for this visit Total time 47 minutes; greater than 50% of the visit spent counseling patient and coordinating care for problems addressed at this encounter  

## 2021-02-02 NOTE — Assessment & Plan Note (Addendum)
Lesion is nontender; no associated cervical LA.  ENT referral again recommended.Lack of progression somewhat reassuring but monitor indicated

## 2021-02-02 NOTE — Assessment & Plan Note (Signed)
Still weak but improved with PT & OT

## 2021-02-02 NOTE — Assessment & Plan Note (Signed)
F/U with Dr Talmage Nap

## 2021-02-08 LAB — CBC: RBC: 4.06 (ref 3.87–5.11)

## 2021-02-08 LAB — BASIC METABOLIC PANEL
BUN: 9 (ref 4–21)
CO2: 24 — AB (ref 13–22)
Chloride: 102 (ref 99–108)
Creatinine: 0.6 (ref 0.5–1.1)
Glucose: 112
Potassium: 4.1 (ref 3.4–5.3)
Sodium: 138 (ref 137–147)

## 2021-02-08 LAB — CBC AND DIFFERENTIAL
HCT: 39 (ref 36–46)
Hemoglobin: 13.1 (ref 12.0–16.0)
Platelets: 319 (ref 150–399)
WBC: 3.1

## 2021-02-08 LAB — COMPREHENSIVE METABOLIC PANEL: Calcium: 10.2 (ref 8.7–10.7)

## 2021-02-10 ENCOUNTER — Encounter: Payer: Self-pay | Admitting: Internal Medicine

## 2021-02-10 DIAGNOSIS — D709 Neutropenia, unspecified: Secondary | ICD-10-CM | POA: Insufficient documentation

## 2021-02-11 ENCOUNTER — Non-Acute Institutional Stay (SKILLED_NURSING_FACILITY): Payer: Medicare HMO | Admitting: Adult Health

## 2021-02-11 ENCOUNTER — Encounter: Payer: Self-pay | Admitting: Adult Health

## 2021-02-11 DIAGNOSIS — I471 Supraventricular tachycardia: Secondary | ICD-10-CM

## 2021-02-11 DIAGNOSIS — E7800 Pure hypercholesterolemia, unspecified: Secondary | ICD-10-CM | POA: Diagnosis not present

## 2021-02-11 DIAGNOSIS — E890 Postprocedural hypothyroidism: Secondary | ICD-10-CM | POA: Diagnosis not present

## 2021-02-11 DIAGNOSIS — I10 Essential (primary) hypertension: Secondary | ICD-10-CM | POA: Diagnosis not present

## 2021-02-11 DIAGNOSIS — E1129 Type 2 diabetes mellitus with other diabetic kidney complication: Secondary | ICD-10-CM

## 2021-02-11 DIAGNOSIS — K2950 Unspecified chronic gastritis without bleeding: Secondary | ICD-10-CM | POA: Diagnosis not present

## 2021-02-11 DIAGNOSIS — R221 Localized swelling, mass and lump, neck: Secondary | ICD-10-CM | POA: Diagnosis not present

## 2021-02-11 DIAGNOSIS — T796XXD Traumatic ischemia of muscle, subsequent encounter: Secondary | ICD-10-CM | POA: Diagnosis not present

## 2021-02-11 NOTE — Progress Notes (Signed)
Location:  [facility] Room Number: 216 A Place of Service:  SNF (31) Provider:  Kenard Gower, DNP, FNP-BC  Patient Care Team: Patient, No Pcp Per as PCP - General (General Practice)  Extended Emergency Contact Information Primary Emergency Contact: WardBurney Gauze Home Phone: [phone] Relation: Sister Secondary Emergency Contact: Scharlene Gloss,  Home Phone: [phone] Relation: Friend  Code Status:   Full Code  Goals of care: Advanced Directive information Advanced Directives 02/02/2021  Does Patient Have a Medical Advance Directive? No  Would patient like information on creating a medical advance directive? No - Patient declined     Chief Complaint  Patient presents with  . Discharge Note    For discharge home on 02/12/21    HPI:  Pt is a 65 y.o. female who is for discharge home on 02/12/21 with Home health PT, OT and Aide.  She was admitted to [facility] on 02/01/21 post  hospitalization 01/21/21 to 02/01/21. She has a PMH of  Hypertension and diabetes mellitus type 2. She apparently felt weak 4 to 5 days prior to admission to the hospital. Her sister usually texts her every morning and did not hear from her for 2 days.  Police was asked to do a welfare check and she was found down covered in urine. She apparently fell but does not remember falling. She was diagnosed with UTI, lactic acidosis, significant rhabdomyolysis and AKI.  CT head was negative for acute intracranial bleed.  X-ray revealed 4 consecutive rib fractures posteriorly without pneumothorax.  She was treated for sepsis with aggressive fluid resuscitation and initiation of broad spectrum antibiotics.  Her mental status improved steadily.  Hospitalization was complicated by an acute episode of SVT with heart rate 206.  She received adenosine and was started on Cardizem.  There was no repeat episode of SVT.  Patient was admitted to this facility  for short-term rehabilitation after the patient's recent hospitalization.  Patient has completed SNF rehabilitation and therapy has cleared the patient for discharge.   Past Medical History:  Diagnosis Date  . Diabetes mellitus with renal complications (HCC) 2022   AKI in context of rhabdo  . Hypercholesteremia   . Hypertension   . Postprocedural hypothyroidism    Past Surgical History:  Procedure Laterality Date  . COLON SURGERY      Allergies  Allergen Reactions  . Metformin And Related Cough  . Zestril [Lisinopril] Cough    Outpatient Encounter Medications as of 02/11/2021  Medication Sig  . acetaminophen (TYLENOL) 325 MG tablet Take 650 mg by mouth every 6 (six) hours as needed for moderate pain.  Marland Kitchen atorvastatin (LIPITOR) 40 MG tablet Take 40 mg by mouth daily.  Marland Kitchen diltiazem (CARDIZEM CD) 180 MG 24 hr capsule Take 1 capsule (180 mg total) by mouth daily.  Marland Kitchen docusate sodium (COLACE) 100 MG capsule Take 1 capsule (100 mg total) by mouth 2 (two) times daily.  . hydrALAZINE (APRESOLINE) 10 MG tablet Take 10 mg by mouth 3 (three) times daily. For HTN  . losartan (COZAAR) 100 MG tablet Take 100 mg by mouth daily.  . magnesium oxide (MAG-OX) 400 (241.3 Mg) MG tablet Take 0.5 tablets (200 mg total) by mouth 2 (two) times daily.  . methocarbamol (ROBAXIN) 500 MG tablet Take 1 tablet (500 mg total) by mouth every 8 (eight) hours as needed for muscle spasms.  . Multiple Vitamin (MULTIVITAMIN WITH MINERALS) TABS tablet Take 1  tablet by mouth daily.  . pantoprazole (PROTONIX) 40 MG tablet Take 1 tablet (40 mg total) by mouth 2 (two) times daily.  . sodium chloride (OCEAN) 0.65 % SOLN nasal spray Place 1 spray into both nostrils daily as needed for congestion.  Marland Kitchen SYNTHROID 100 MCG tablet Take 100 mcg by mouth daily.   No facility-administered encounter medications on file as of 02/11/2021.    Review of Systems  GENERAL: No change in appetite, no fatigue, no weight changes, no fever,  chills or weakness MOUTH and THROAT: Denies oral discomfort, gingival pain or bleeding RESPIRATORY: no cough, SOB, DOE, wheezing, hemoptysis CARDIAC: No chest pain, edema or palpitations GI: No abdominal pain, diarrhea, constipation, heart burn, nausea or vomiting GU: Denies dysuria, frequency, hematuria, incontinence, or discharge NEUROLOGICAL: Denies dizziness, syncope, numbness, or headache PSYCHIATRIC: Denies feelings of depression or anxiety. No report of hallucinations, insomnia, paranoia, or agitation   Immunization History  Administered Date(s) Administered  . Influenza, Quadrivalent, Recombinant, Inj, Pf 08/21/2019, 08/12/2020  . Janssen (J&J) SARS-COV-2 Vaccination 02/25/2020, 09/29/2020   Pertinent  Health Maintenance Due  Topic Date Due  . FOOT EXAM  Never done  . OPHTHALMOLOGY EXAM  Never done  . COLONOSCOPY (Pts 45-10yrs Insurance coverage will need to be confirmed)  Never done  . MAMMOGRAM  Never done  . DEXA SCAN  Never done  . PNA vac Low Risk Adult (1 of 2 - PCV13) Never done  . HEMOGLOBIN A1C  07/24/2021  . INFLUENZA VACCINE  Completed   No flowsheet data found.   Vitals:   02/11/21 1000  BP: 133/79  Pulse: 71  Resp: 18  Temp: 97.7 F (36.5 C)  Weight: 179 lb 3.2 oz (81.3 kg)  Height:  (1.676 m)   Body mass index is 28.92 kg/m.  Physical Exam  GENERAL APPEARANCE: Well nourished. In no acute distress.Obese SKIN:  Skin is warm and dry.  MOUTH and THROAT: Lips are without lesions. Oral mucosa is moist and without lesions. Tongue is normal in shape, size, and color and without lesions RESPIRATORY: Breathing is even & unlabored, BS CTAB CARDIAC: RRR, no murmur,no extra heart sounds, no edema GI: Abdomen soft, normal BS, no masses, no tenderness EXTREMITIES:  Able to move X 4 extremities NEUROLOGICAL: There is no tremor. Speech is clear. Alert and oriented X 3. PSYCHIATRIC:  Affect and behavior are appropriate  Labs reviewed: Recent Labs     01/21/21 1708 01/21/21 1731 01/28/21 0221 01/29/21 0217 01/30/21 0208  NA  --    < > 136 134* 134*  K 3.2*   < > 3.5 4.2 3.8  CL  --    < > 101 102 101  CO2  --    < > GLUCOSE  --    < > 98 105* 119*  BUN  --    < > 5* 7* 6*  CREATININE  --    < > 0.55 0.68 0.66  CALCIUM  --    < > 8.8* 8.9 9.2  MG 1.5*   < > 1.6* 1.7 1.8  PHOS 3.1  --   --   --   --    < > = values in this interval not displayed.   Recent Labs    01/24/21 0049 01/25/21 0545 01/27/21 0229  AST 171* 107* 115*  ALT 103* 90* 107*  ALKPHOS 40 42 59  BILITOT 1.2 0.6 0.5  PROT 4.9* 4.9* 5.1*  ALBUMIN 2.6* 2.5* 2.7*  Recent Labs    01/23/21 0131 01/24/21 0049 01/25/21 0545 01/30/21 0208  WBC 10.8* 8.3 6.9 7.4  NEUTROABS 8.4* 5.6 4.3  --   HGB 11.5* 11.2* 12.0 13.0  HCT 32.9* 31.0* 34.3* 38.2  MCV 95.4 92.5 92.5 95.5  PLT 126* 134* 160 273   Lab Results  Component Value Date   TSH 8.604 (H) 01/21/2021   Lab Results  Component Value Date   HGBA1C 5.5 01/21/2021    Significant Diagnostic Results in last 30 days:  CT HEAD WO CONTRAST  Result Date: 01/21/2021 CLINICAL DATA:  Recent fall with headaches and facial pain, initial encounter EXAM: CT HEAD WITHOUT CONTRAST CT CERVICAL SPINE WITHOUT CONTRAST TECHNIQUE: Multidetector CT imaging of the head and cervical spine was performed following the standard protocol without intravenous contrast. Multiplanar CT image reconstructions of the cervical spine were also generated. COMPARISON:  None. FINDINGS: CT HEAD FINDINGS Brain: No evidence of acute infarction, hemorrhage, hydrocephalus, extra-axial collection or mass lesion/mass effect. Vascular: No hyperdense vessel or unexpected calcification. Skull: Normal. Negative for fracture or focal lesion. Sinuses/Orbits: No acute finding. Other: Mild scalp hematoma is noted in the left posterior parietal region related to the recent injury. CT CERVICAL SPINE FINDINGS Alignment: Mild straightening of the normal  cervical lordosis is noted likely related to muscular spasm. Skull base and vertebrae: 7 cervical segments are well visualized. Vertebral body height is well maintained. Mild osteophytic changes are noted primarily at C6-7 anteriorly. No acute fracture or acute facet abnormality is noted. The odontoid is within normal limits. Soft tissues and spinal canal: There is a 16 by 25 mm mildly hyperdense nodule identified in the posterior aspect of the left parotid gland. This was present on prior CT of the neck from 08/25/2020 and is somewhat smaller in size when compared with the prior exam. Upper chest: Visualized lung apices are within normal limits. Other: None IMPRESSION: CT of the head: Mild scalp hematoma in the left posterior parietal region. No acute intracranial abnormality noted. CT of the cervical spine: Mild degenerative change of the cervical spine without acute bony abnormality. Left parotid lesion somewhat smaller and less heterogeneous when compared with the prior exam as described. ENT referral is again recommended if this has not been performed. Electronically Signed   By: Alcide CleverMark  Lukens M.D.   On: 01/21/2021 14:43   CT CHEST W CONTRAST  Result Date: 01/21/2021 CLINICAL DATA:  Larey SeatFell. Left rib fractures on a portable chest earlier today. Abdominal trauma. EXAM: CT CHEST, ABDOMEN, AND PELVIS WITH CONTRAST TECHNIQUE: Multidetector CT imaging of the chest, abdomen and pelvis was performed following the standard protocol during bolus administration of intravenous contrast. CONTRAST:  100mL OMNIPAQUE IOHEXOL 300 MG/ML  SOLN COMPARISON:  Portable chest obtained earlier today. FINDINGS: CT CHEST FINDINGS Cardiovascular: Atheromatous calcifications, including the coronary arteries and aorta. Normal sized heart. Minimal pericardial effusion with a maximum thickness of 7 mm. Mediastinum/Nodes: Very small thyroid gland or thyroid remnant. No enlarged lymph nodes. Unremarkable esophagus. Lungs/Pleura: Minimal  bilateral dependent atelectasis. Otherwise, clear lungs. No pneumothorax or pleural fluid. Musculoskeletal: Left posterolateral acute 4th through 8th rib fractures. Varying degrees of displacement. No spinal fractures or subluxations. The sternum is intact. Thoracic and lower cervical spine degenerative changes. Diffuse osteopenia. CT ABDOMEN PELVIS FINDINGS Hepatobiliary: No focal liver abnormality is seen. No gallstones, gallbladder wall thickening, or biliary dilatation. Pancreas: Unremarkable. No pancreatic ductal dilatation or surrounding inflammatory changes. Spleen: Normal in size without focal abnormality. Adrenals/Urinary Tract: Normal appearing adrenal glands. Small  bilateral renal cysts. Unremarkable ureters and urinary bladder. Stomach/Bowel: Moderate diffuse low density wall thickening involving the gastric antrum, pylorus, duodenal bulb and 2nd portion of the duodenum. Minimal adjacent soft tissue stranding. Moderate to marked luminal narrowing. Unremarkable colon and appendix. Vascular/Lymphatic: Atheromatous arterial calcifications without aneurysm. No enlarged lymph nodes. Reproductive: Status post hysterectomy. No adnexal masses. Other: Small umbilical hernia containing fat. Multiple small supraumbilical and infraumbilical ventral hernias containing fat. Musculoskeletal: Lumbar spine degenerative changes. These include facet degenerative changes with associated grade 1 anterolisthesis at the L4-5 level. No fractures, subluxations or dislocations. IMPRESSION: 1. Left posterolateral acute 4th through 8th rib fractures without pneumothorax. 2. Moderate diffuse low density wall thickening involving the gastric antrum, pylorus, duodenal bulb and 2nd portion of the duodenum with associated moderate to marked luminal narrowing. This is most likely due to gastritis and duodenitis. 3. Minimal pericardial effusion. 4. Multiple small supraumbilical and infraumbilical ventral hernias containing fat. 5.  Calcific coronary artery and aortic atherosclerosis. Aortic Atherosclerosis (ICD10-I70.0). Electronically Signed   By: Beckie Salts M.D.   On: 01/21/2021 16:43   CT CERVICAL SPINE WO CONTRAST  Result Date: 01/21/2021 CLINICAL DATA:  Recent fall with headaches and facial pain, initial encounter EXAM: CT HEAD WITHOUT CONTRAST CT CERVICAL SPINE WITHOUT CONTRAST TECHNIQUE: Multidetector CT imaging of the head and cervical spine was performed following the standard protocol without intravenous contrast. Multiplanar CT image reconstructions of the cervical spine were also generated. COMPARISON:  None. FINDINGS: CT HEAD FINDINGS Brain: No evidence of acute infarction, hemorrhage, hydrocephalus, extra-axial collection or mass lesion/mass effect. Vascular: No hyperdense vessel or unexpected calcification. Skull: Normal. Negative for fracture or focal lesion. Sinuses/Orbits: No acute finding. Other: Mild scalp hematoma is noted in the left posterior parietal region related to the recent injury. CT CERVICAL SPINE FINDINGS Alignment: Mild straightening of the normal cervical lordosis is noted likely related to muscular spasm. Skull base and vertebrae: 7 cervical segments are well visualized. Vertebral body height is well maintained. Mild osteophytic changes are noted primarily at C6-7 anteriorly. No acute fracture or acute facet abnormality is noted. The odontoid is within normal limits. Soft tissues and spinal canal: There is a 16 by 25 mm mildly hyperdense nodule identified in the posterior aspect of the left parotid gland. This was present on prior CT of the neck from 08/25/2020 and is somewhat smaller in size when compared with the prior exam. Upper chest: Visualized lung apices are within normal limits. Other: None IMPRESSION: CT of the head: Mild scalp hematoma in the left posterior parietal region. No acute intracranial abnormality noted. CT of the cervical spine: Mild degenerative change of the cervical spine without  acute bony abnormality. Left parotid lesion somewhat smaller and less heterogeneous when compared with the prior exam as described. ENT referral is again recommended if this has not been performed. Electronically Signed   By: Alcide Clever M.D.   On: 01/21/2021 14:43   CT ABDOMEN PELVIS W CONTRAST  Result Date: 01/21/2021 CLINICAL DATA:  Larey Seat. Left rib fractures on a portable chest earlier today. Abdominal trauma. EXAM: CT CHEST, ABDOMEN, AND PELVIS WITH CONTRAST TECHNIQUE: Multidetector CT imaging of the chest, abdomen and pelvis was performed following the standard protocol during bolus administration of intravenous contrast. CONTRAST:  OMNIPAQUE IOHEXOL 300 MG/ML  SOLN COMPARISON:  Portable chest obtained earlier today. FINDINGS: CT CHEST FINDINGS Cardiovascular: Atheromatous calcifications, including the coronary arteries and aorta. Normal sized heart. Minimal pericardial effusion with a maximum thickness of 7 mm. Mediastinum/Nodes: Very  small thyroid gland or thyroid remnant. No enlarged lymph nodes. Unremarkable esophagus. Lungs/Pleura: Minimal bilateral dependent atelectasis. Otherwise, clear lungs. No pneumothorax or pleural fluid. Musculoskeletal: Left posterolateral acute 4th through 8th rib fractures. Varying degrees of displacement. No spinal fractures or subluxations. The sternum is intact. Thoracic and lower cervical spine degenerative changes. Diffuse osteopenia. CT ABDOMEN PELVIS FINDINGS Hepatobiliary: No focal liver abnormality is seen. No gallstones, gallbladder wall thickening, or biliary dilatation. Pancreas: Unremarkable. No pancreatic ductal dilatation or surrounding inflammatory changes. Spleen: Normal in size without focal abnormality. Adrenals/Urinary Tract: Normal appearing adrenal glands. Small bilateral renal cysts. Unremarkable ureters and urinary bladder. Stomach/Bowel: Moderate diffuse low density wall thickening involving the gastric antrum, pylorus, duodenal bulb and 2nd  portion of the duodenum. Minimal adjacent soft tissue stranding. Moderate to marked luminal narrowing. Unremarkable colon and appendix. Vascular/Lymphatic: Atheromatous arterial calcifications without aneurysm. No enlarged lymph nodes. Reproductive: Status post hysterectomy. No adnexal masses. Other: Small umbilical hernia containing fat. Multiple small supraumbilical and infraumbilical ventral hernias containing fat. Musculoskeletal: Lumbar spine degenerative changes. These include facet degenerative changes with associated grade 1 anterolisthesis at the L4-5 level. No fractures, subluxations or dislocations. IMPRESSION: 1. Left posterolateral acute 4th through 8th rib fractures without pneumothorax. 2. Moderate diffuse low density wall thickening involving the gastric antrum, pylorus, duodenal bulb and 2nd portion of the duodenum with associated moderate to marked luminal narrowing. This is most likely due to gastritis and duodenitis. 3. Minimal pericardial effusion. 4. Multiple small supraumbilical and infraumbilical ventral hernias containing fat. 5. Calcific coronary artery and aortic atherosclerosis. Aortic Atherosclerosis (ICD10-I70.0). Electronically Signed   By: Beckie Salts M.D.   On: 01/21/2021 16:43   DG Chest Port 1 View  Result Date: 01/21/2021 CLINICAL DATA:  Altered mental status following a fall today. EXAM: PORTABLE CHEST 1 VIEW COMPARISON:  None. FINDINGS: Normal sized heart. Mildly tortuous and minimally calcified thoracic aorta. Minimal linear atelectasis or scarring in the left lower lung zone. Otherwise clear lungs with normal vascularity. Mildly displaced left 4th posterior rib fracture. Minimally displaced left 6th and 7th posterior rib fractures. No pneumothorax. IMPRESSION: Left 4th, 6th and 7th posterior rib fractures without pneumothorax. Electronically Signed   By: Beckie Salts M.D.   On: 01/21/2021 11:45   ECHOCARDIOGRAM COMPLETE  Result Date: 01/25/2021    ECHOCARDIOGRAM REPORT    Patient Name:   JAZMEEN AXTELL Date of Exam: 01/25/2021 Medical Rec #:  [id number]       Height:       66.0 in Accession #:    [id number]      Weight:       180.6 lb Date of Birth:  15-Jul-1956        BSA:          1.915 m Patient Age:    64 years        BP:           139/74 mmHg Patient Gender: F               HR:           59 bpm. Exam Location:  Inpatient Procedure: 2D Echo, Cardiac Doppler and Color Doppler Indications:    R55 Syncope  History:        Patient has no prior history of Echocardiogram examinations.  Sonographer:    Roosvelt Maser RDCS Referring Phys: [phone] [place] L JOHNSON IMPRESSIONS  1. Left ventricular ejection fraction, by estimation, is 60 to 65%. The left ventricle has normal function.  The left ventricle has no regional wall motion abnormalities. There is mild concentric left ventricular hypertrophy. Left ventricular diastolic parameters are consistent with Grade I diastolic dysfunction (impaired relaxation).  2. Right ventricular systolic function is normal. The right ventricular size is normal.  3. The mitral valve is normal in structure. Trivial mitral valve regurgitation.  4. The aortic valve is tricuspid. There is mild thickening of the aortic valve. Aortic valve regurgitation is not visualized. Mild aortic valve sclerosis is present, with no evidence of aortic valve stenosis.  5. The inferior vena cava is normal in size with <50% respiratory variability, suggesting right atrial pressure of 8 mmHg. Comparison(s): No prior Echocardiogram. FINDINGS  Left Ventricle: Left ventricular ejection fraction, by estimation, is 60 to 65%. The left ventricle has normal function. The left ventricle has no regional wall motion abnormalities. The left ventricular internal cavity size was normal in size. There is  mild concentric left ventricular hypertrophy. Left ventricular diastolic parameters are consistent with Grade I diastolic dysfunction (impaired relaxation). Right Ventricle: The right ventricular  size is normal. No increase in right ventricular wall thickness. Right ventricular systolic function is normal. Left Atrium: Left atrial size was normal in size. Right Atrium: Right atrial size was normal in size. Pericardium: There is no evidence of pericardial effusion. Mitral Valve: The mitral valve is normal in structure. Mild mitral annular calcification. Trivial mitral valve regurgitation. Tricuspid Valve: The tricuspid valve is normal in structure. Tricuspid valve regurgitation is trivial. Aortic Valve: The aortic valve is tricuspid. There is mild thickening of the aortic valve. Aortic valve regurgitation is not visualized. Mild aortic valve sclerosis is present, with no evidence of aortic valve stenosis. Aortic valve mean gradient measures 4.0 mmHg. Aortic valve peak gradient measures 7.5 mmHg. Pulmonic Valve: The pulmonic valve was normal in structure. Pulmonic valve regurgitation is trivial. Aorta: The aortic root and ascending aorta are structurally normal, with no evidence of dilitation. Venous: The inferior vena cava is normal in size with less than 50% respiratory variability, suggesting right atrial pressure of 8 mmHg. IAS/Shunts: No atrial level shunt detected by color flow Doppler.  LEFT VENTRICLE PLAX 2D LVIDd:         4.50 cm Diastology LVIDs:         3.00 cm LV e' medial:    9.36 cm/s LV PW:         0.90 cm LV E/e' medial:  7.6 LV IVS:        0.90 cm LV e' lateral:   6.20 cm/s                        LV E/e' lateral: 11.4  RIGHT VENTRICLE          IVC RV Basal diam:  3.60 cm  IVC diam: 1.50 cm LEFT ATRIUM             Index       RIGHT ATRIUM           Index LA diam:        3.40 cm 1.78 cm/m  RA Area:     25.65 cm LA Vol (A2C):   72.8 ml 38.02 ml/m RA Volume:   82.40 ml  43.03 ml/m LA Vol (A4C):   77.9 ml 40.68 ml/m LA Biplane Vol: 76.7 ml 40.06 ml/m  AORTIC VALVE AV Vmax:           137.00 cm/s AV Vmean:  93.400 cm/s AV VTI:            0.269 m AV Peak Grad:      7.5 mmHg AV Mean Grad:       4.0 mmHg LVOT Vmax:         113.00 cm/s LVOT Vmean:        67.300 cm/s LVOT VTI:          0.224 m LVOT/AV VTI ratio: 0.83  AORTA Ao Root diam: 2.80 cm Ao Asc diam:  3.10 cm MITRAL VALVE               TRICUSPID VALVE MV Area (PHT): 3.77 cm    TR Peak grad:   37.0 mmHg MV Decel Time: 201 msec    TR Vmax:        304.00 cm/s MV E velocity: 70.70 cm/s MV A velocity: 79.30 cm/s  SHUNTS MV E/A ratio:  0.89        Systemic VTI: 0.22 m Laurance Flatten MD Electronically signed by Laurance Flatten MD Signature Date/Time: 01/25/2021/4:48:27 PM    Final     Assessment/Plan  1. Traumatic rhabdomyolysis, subsequent encounter -  S/P aggressive IV fluids -   Will have home health PT and OT, for therapeutic strengthening exercises  2. SVT (supraventricular tachycardia) (HCC) -   Continue diltiazem 24-hour ER 180 mg 1 capsule orally daily -    Follow-up with cardiology, Dr. Hillis Range  3. Mass of left side of neck -   Follow-up with ENT  4. Essential hypertension -Continue losartan 100 mg 1 tab orally daily and hydralazine 10 mg 1 tab 3 times a day  5. Type 2 diabetes mellitus with other diabetic kidney complication, without long-term current use of insulin (HCC) Lab Results  Component Value Date   HGBA1C 5.5 01/21/2021   -   Diet controlled  6.  Hypothyroidism Lab Results  Component Value Date   TSH 8.604 (H) 01/21/2021   -   Continue levothyroxine 100 mcg 1 tab orally daily  7.  Pure hypercholesterolemia -   Continue atorvastatin 40 mg 1 tab orally every evening  8.  Chronic gastritis -   Continue pantoprazole 40 mg 1 tab twice a day    I have filled out patient's discharge paperwork. Patient will have home health PT, OT and Aide.  DME provided: Shower bench, 3-in-1, grab bar and walker  Total discharge time: Greater than 30 minutes Greater than 50% was spent in counseling and coordination of care.   Discharge time involved coordination of the discharge process with social  worker, nursing staff and therapy department. Medical justification for home health services/DME verified.    Kenard Gower, DNP, MSN, FNP-BC [facility] [phone] (Monday-Friday 8:00 a.m. - 5:00 p.m.) [phone] (after hours)

## 2021-02-14 DIAGNOSIS — I10 Essential (primary) hypertension: Secondary | ICD-10-CM | POA: Diagnosis not present

## 2021-02-14 DIAGNOSIS — K439 Ventral hernia without obstruction or gangrene: Secondary | ICD-10-CM | POA: Diagnosis not present

## 2021-02-14 DIAGNOSIS — E119 Type 2 diabetes mellitus without complications: Secondary | ICD-10-CM | POA: Diagnosis not present

## 2021-02-14 DIAGNOSIS — K118 Other diseases of salivary glands: Secondary | ICD-10-CM | POA: Diagnosis not present

## 2021-02-14 DIAGNOSIS — I2510 Atherosclerotic heart disease of native coronary artery without angina pectoris: Secondary | ICD-10-CM | POA: Diagnosis not present

## 2021-02-14 DIAGNOSIS — M47816 Spondylosis without myelopathy or radiculopathy, lumbar region: Secondary | ICD-10-CM | POA: Diagnosis not present

## 2021-02-14 DIAGNOSIS — I471 Supraventricular tachycardia: Secondary | ICD-10-CM | POA: Diagnosis not present

## 2021-02-14 DIAGNOSIS — M47812 Spondylosis without myelopathy or radiculopathy, cervical region: Secondary | ICD-10-CM | POA: Diagnosis not present

## 2021-02-14 DIAGNOSIS — S2242XD Multiple fractures of ribs, left side, subsequent encounter for fracture with routine healing: Secondary | ICD-10-CM | POA: Diagnosis not present

## 2021-02-14 NOTE — Progress Notes (Signed)
PCP:  Dorisann Frames, MD Primary Cardiologist: No primary care provider on file. Electrophysiologist: Hillis Range, MD   Kristina Gates is a 65 y.o. female with HTN, DM2, HLD, and SVT seen today for Hillis Range, MD for post hospital follow up.  Since discharge from hospital the patient reports doing OK. She is gradually getting back up to her baseline.    Pt admitted earlier this month after being found down on the floor. Work up significant for rib fractures and rhabdomyolysis. She was noted to have SVT in 190-210 range that per notes was responsive to Adenosine.   She does not felt like she has had any further tachy-palpitations since discharge. She has had them on occasional chronically. Denies syncope or near syncope. She has mild lightheadedness at times that she describes as a "room spinning", this is worse in the evenings.  She does have mild fatigue after taking or morning or evening medications. She had mild peripheral edema when leaving the hospital that has since normalized.    she denies chest pain, palpitations, dyspnea, PND, orthopnea, nausea, vomiting, syncope, weight gain, or early satiety.  Past Medical History:  Diagnosis Date  . Diabetes mellitus with renal complications (HCC) 2022   AKI in context of rhabdo  . Hypercholesteremia   . Hypertension   . Postprocedural hypothyroidism    Past Surgical History:  Procedure Laterality Date  . COLON SURGERY      Current Outpatient Medications  Medication Sig Dispense Refill  . acetaminophen (TYLENOL) 325 MG tablet Take 650 mg by mouth every 6 (six) hours as needed for moderate pain.    Marland Kitchen atorvastatin (LIPITOR) 40 MG tablet Take 40 mg by mouth daily.    Marland Kitchen diltiazem (CARDIZEM CD) 180 MG 24 hr capsule Take 1 capsule (180 mg total) by mouth daily. 30 capsule 6  . docusate sodium (COLACE) 100 MG capsule Take 1 capsule (100 mg total) by mouth 2 (two) times daily. 10 capsule 0  . hydrALAZINE (APRESOLINE) 10 MG tablet Take 10  mg by mouth 3 (three) times daily. For HTN    . losartan (COZAAR) 100 MG tablet Take 100 mg by mouth daily.    . magnesium oxide (MAG-OX) 400 (241.3 Mg) MG tablet Take 0.5 tablets (200 mg total) by mouth 2 (two) times daily. 30 tablet 0  . methocarbamol (ROBAXIN) 500 MG tablet Take 1 tablet (500 mg total) by mouth every 8 (eight) hours as needed for muscle spasms. 10 tablet 0  . Multiple Vitamin (MULTIVITAMIN WITH MINERALS) TABS tablet Take 1 tablet by mouth daily.    . pantoprazole (PROTONIX) 40 MG tablet Take 1 tablet (40 mg total) by mouth 2 (two) times daily. 60 tablet 0  . sodium chloride (OCEAN) 0.65 % SOLN nasal spray Place 1 spray into both nostrils daily as needed for congestion.    Marland Kitchen SYNTHROID 100 MCG tablet Take 100 mcg by mouth daily.     No current facility-administered medications for this visit.    Allergies  Allergen Reactions  . Metformin And Related Cough  . Zestril [Lisinopril] Cough    Social History   Socioeconomic History  . Marital status: Married    Spouse name: Not on file  . Number of children: Not on file  . Years of education: Not on file  . Highest education level: Not on file  Occupational History  . Not on file  Tobacco Use  . Smoking status: Current Some Day Smoker    Types: Cigarettes  .  Smokeless tobacco: Never Used  Vaping Use  . Vaping Use: Never used  Substance and Sexual Activity  . Alcohol use: Yes    Comment: occasional  . Drug use: Never  . Sexual activity: Not on file  Other Topics Concern  . Not on file  Social History Narrative  . Not on file   Social Determinants of Health   Financial Resource Strain: Not on file  Food Insecurity: Not on file  Transportation Needs: Not on file  Physical Activity: Not on file  Stress: Not on file  Social Connections: Not on file  Intimate Partner Violence: Not on file    Review of Systems: All other systems reviewed and are otherwise negative except as noted above.  Physical  Exam: Vitals:   02/15/21 1030  BP: (!) 150/70  Pulse: 82  SpO2: 95%  Weight: 175 lb (79.4 kg)  Height: 5\' 3"  (1.6 m)    GEN- The patient is well appearing, alert and oriented x 3 today.   HEENT: normocephalic, atraumatic; sclera clear, conjunctiva pink; hearing intact; oropharynx clear; neck supple, no JVP Lymph- no cervical lymphadenopathy Lungs- Clear to ausculation bilaterally, normal work of breathing.  No wheezes, rales, rhonchi Heart- Regular rate and rhythm, no murmurs, rubs or gallops, PMI not laterally displaced GI- soft, non-tender, non-distended, bowel sounds present, no hepatosplenomegaly Extremities- no clubbing, cyanosis, or edema; DP/PT/radial pulses 2+ bilaterally MS- no significant deformity or atrophy Skin- warm and dry, no rash or lesion Psych- euthymic mood, full affect Neuro- strength and sensation are intact  EKG is ordered. Personal review of EKG from today shows NSRat 82 bpm  Additional studies reviewed include: Previous EP notes. Recent labs and EKGs  Assessment and Plan:  1.  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 Continue diltiazem 180 mg daily. Move to bedtime. Can use short acting prn as well if needed.  EF normal Likely in setting of acute illness, though chronic component noted (she reports infrequent and relatively un-bothersome) If not controlled on medications and outside of the setting of infection, she may be candidate for ablation in the future. She would like to avoid this if possible.   2. HTN Off atenolol and HCTz since admission BP slightly elevated today. They will monitor at home and discuss with PCP if BP remains > 140 systolic regularly.  Would consider adding back HCTz now that her renal function has normalized.   , PA-C  02/15/21 10:40 AM

## 2021-02-15 ENCOUNTER — Other Ambulatory Visit: Payer: Self-pay

## 2021-02-15 ENCOUNTER — Ambulatory Visit: Payer: Medicare HMO | Admitting: Student

## 2021-02-15 ENCOUNTER — Encounter: Payer: Self-pay | Admitting: Student

## 2021-02-15 VITALS — BP 150/70 | HR 82 | Ht 63.0 in | Wt 175.0 lb

## 2021-02-15 DIAGNOSIS — I471 Supraventricular tachycardia: Secondary | ICD-10-CM | POA: Diagnosis not present

## 2021-02-15 NOTE — Patient Instructions (Signed)
Medication Instructions:  Your physician recommends that you continue on your current medications as directed. Please refer to the Current Medication list given to you today.  *If you need a refill on your cardiac medications before your next appointment, please call your pharmacy*   Lab Work: None If you have labs (blood work) drawn today and your tests are completely normal, you will receive your results only by: Marland Kitchen MyChart Message (if you have MyChart) OR . A paper copy in the mail If you have any lab test that is abnormal or we need to change your treatment, we will call you to review the results.   Follow-Up: At [facility], you and your health needs are our priority.  As part of our continuing mission to provide you with exceptional heart care, we have created designated Provider Care Teams.  These Care Teams include your primary Cardiologist (physician) and Advanced Practice Providers (APPs -  Physician Assistants and Nurse Practitioners) who all work together to provide you with the care you need, when you need it.  We recommend signing up for the patient portal called "MyChart".  Sign up information is provided on this After Visit Summary.  MyChart is used to connect with patients for Virtual Visits (Telemedicine).  Patients are able to view lab/test results, encounter notes, upcoming appointments, etc.  Non-urgent messages can be sent to your provider as well.   To learn more about what you can do with MyChart, go to [email or web address].    Your next appointment:   As scheduled with Otilio Saber, PA

## 2021-02-18 DIAGNOSIS — M47812 Spondylosis without myelopathy or radiculopathy, cervical region: Secondary | ICD-10-CM | POA: Diagnosis not present

## 2021-02-18 DIAGNOSIS — I471 Supraventricular tachycardia: Secondary | ICD-10-CM | POA: Diagnosis not present

## 2021-02-18 DIAGNOSIS — K439 Ventral hernia without obstruction or gangrene: Secondary | ICD-10-CM | POA: Diagnosis not present

## 2021-02-18 DIAGNOSIS — M47816 Spondylosis without myelopathy or radiculopathy, lumbar region: Secondary | ICD-10-CM | POA: Diagnosis not present

## 2021-02-18 DIAGNOSIS — S2242XD Multiple fractures of ribs, left side, subsequent encounter for fracture with routine healing: Secondary | ICD-10-CM | POA: Diagnosis not present

## 2021-02-18 DIAGNOSIS — E119 Type 2 diabetes mellitus without complications: Secondary | ICD-10-CM | POA: Diagnosis not present

## 2021-02-18 DIAGNOSIS — I2510 Atherosclerotic heart disease of native coronary artery without angina pectoris: Secondary | ICD-10-CM | POA: Diagnosis not present

## 2021-02-18 DIAGNOSIS — I10 Essential (primary) hypertension: Secondary | ICD-10-CM | POA: Diagnosis not present

## 2021-02-18 DIAGNOSIS — K118 Other diseases of salivary glands: Secondary | ICD-10-CM | POA: Diagnosis not present

## 2021-02-25 DIAGNOSIS — K439 Ventral hernia without obstruction or gangrene: Secondary | ICD-10-CM | POA: Diagnosis not present

## 2021-02-25 DIAGNOSIS — E119 Type 2 diabetes mellitus without complications: Secondary | ICD-10-CM | POA: Diagnosis not present

## 2021-02-25 DIAGNOSIS — I471 Supraventricular tachycardia: Secondary | ICD-10-CM | POA: Diagnosis not present

## 2021-02-25 DIAGNOSIS — M47812 Spondylosis without myelopathy or radiculopathy, cervical region: Secondary | ICD-10-CM | POA: Diagnosis not present

## 2021-02-25 DIAGNOSIS — I2510 Atherosclerotic heart disease of native coronary artery without angina pectoris: Secondary | ICD-10-CM | POA: Diagnosis not present

## 2021-02-25 DIAGNOSIS — K118 Other diseases of salivary glands: Secondary | ICD-10-CM | POA: Diagnosis not present

## 2021-02-25 DIAGNOSIS — M47816 Spondylosis without myelopathy or radiculopathy, lumbar region: Secondary | ICD-10-CM | POA: Diagnosis not present

## 2021-02-25 DIAGNOSIS — I10 Essential (primary) hypertension: Secondary | ICD-10-CM | POA: Diagnosis not present

## 2021-02-25 DIAGNOSIS — S2242XD Multiple fractures of ribs, left side, subsequent encounter for fracture with routine healing: Secondary | ICD-10-CM | POA: Diagnosis not present

## 2021-03-18 DIAGNOSIS — E7800 Pure hypercholesterolemia, unspecified: Secondary | ICD-10-CM | POA: Diagnosis not present

## 2021-03-18 DIAGNOSIS — I10 Essential (primary) hypertension: Secondary | ICD-10-CM | POA: Diagnosis not present

## 2021-03-18 DIAGNOSIS — E1165 Type 2 diabetes mellitus with hyperglycemia: Secondary | ICD-10-CM | POA: Diagnosis not present

## 2021-03-18 DIAGNOSIS — E890 Postprocedural hypothyroidism: Secondary | ICD-10-CM | POA: Diagnosis not present

## 2021-05-10 ENCOUNTER — Inpatient Hospital Stay (HOSPITAL_COMMUNITY)
Admission: EM | Admit: 2021-05-10 | Discharge: 2021-05-14 | DRG: 682 | Disposition: A | Discharge status: Skilled Nursing Facility | Payer: Medicare HMO | Attending: Family Medicine | Admitting: Family Medicine

## 2021-05-10 ENCOUNTER — Emergency Department (HOSPITAL_COMMUNITY): Payer: Medicare HMO

## 2021-05-10 ENCOUNTER — Inpatient Hospital Stay (HOSPITAL_COMMUNITY): Payer: Medicare HMO

## 2021-05-10 ENCOUNTER — Other Ambulatory Visit (HOSPITAL_COMMUNITY): Payer: Medicare HMO

## 2021-05-10 DIAGNOSIS — I10 Essential (primary) hypertension: Secondary | ICD-10-CM | POA: Diagnosis not present

## 2021-05-10 DIAGNOSIS — R278 Other lack of coordination: Secondary | ICD-10-CM | POA: Diagnosis not present

## 2021-05-10 DIAGNOSIS — N3289 Other specified disorders of bladder: Secondary | ICD-10-CM | POA: Diagnosis present

## 2021-05-10 DIAGNOSIS — E872 Acidosis, unspecified: Secondary | ICD-10-CM | POA: Diagnosis present

## 2021-05-10 DIAGNOSIS — E1129 Type 2 diabetes mellitus with other diabetic kidney complication: Secondary | ICD-10-CM | POA: Diagnosis present

## 2021-05-10 DIAGNOSIS — I959 Hypotension, unspecified: Secondary | ICD-10-CM

## 2021-05-10 DIAGNOSIS — Z79899 Other long term (current) drug therapy: Secondary | ICD-10-CM

## 2021-05-10 DIAGNOSIS — E669 Obesity, unspecified: Secondary | ICD-10-CM | POA: Diagnosis present

## 2021-05-10 DIAGNOSIS — D696 Thrombocytopenia, unspecified: Secondary | ICD-10-CM | POA: Diagnosis not present

## 2021-05-10 DIAGNOSIS — G9341 Metabolic encephalopathy: Secondary | ICD-10-CM | POA: Diagnosis present

## 2021-05-10 DIAGNOSIS — R5381 Other malaise: Secondary | ICD-10-CM | POA: Diagnosis not present

## 2021-05-10 DIAGNOSIS — E871 Hypo-osmolality and hyponatremia: Secondary | ICD-10-CM | POA: Diagnosis present

## 2021-05-10 DIAGNOSIS — E890 Postprocedural hypothyroidism: Secondary | ICD-10-CM | POA: Diagnosis present

## 2021-05-10 DIAGNOSIS — K56600 Partial intestinal obstruction, unspecified as to cause: Secondary | ICD-10-CM | POA: Diagnosis present

## 2021-05-10 DIAGNOSIS — I471 Supraventricular tachycardia: Secondary | ICD-10-CM | POA: Diagnosis not present

## 2021-05-10 DIAGNOSIS — Z20822 Contact with and (suspected) exposure to covid-19: Secondary | ICD-10-CM | POA: Diagnosis present

## 2021-05-10 DIAGNOSIS — Z8619 Personal history of other infectious and parasitic diseases: Secondary | ICD-10-CM

## 2021-05-10 DIAGNOSIS — D72829 Elevated white blood cell count, unspecified: Secondary | ICD-10-CM | POA: Diagnosis present

## 2021-05-10 DIAGNOSIS — E11649 Type 2 diabetes mellitus with hypoglycemia without coma: Secondary | ICD-10-CM | POA: Diagnosis present

## 2021-05-10 DIAGNOSIS — E162 Hypoglycemia, unspecified: Secondary | ICD-10-CM | POA: Diagnosis not present

## 2021-05-10 DIAGNOSIS — N179 Acute kidney failure, unspecified: Principal | ICD-10-CM | POA: Diagnosis present

## 2021-05-10 DIAGNOSIS — R6510 Systemic inflammatory response syndrome (SIRS) of non-infectious origin without acute organ dysfunction: Secondary | ICD-10-CM | POA: Diagnosis not present

## 2021-05-10 DIAGNOSIS — M6282 Rhabdomyolysis: Secondary | ICD-10-CM | POA: Diagnosis present

## 2021-05-10 DIAGNOSIS — E876 Hypokalemia: Secondary | ICD-10-CM | POA: Diagnosis present

## 2021-05-10 DIAGNOSIS — E869 Volume depletion, unspecified: Secondary | ICD-10-CM | POA: Diagnosis not present

## 2021-05-10 DIAGNOSIS — Z823 Family history of stroke: Secondary | ICD-10-CM

## 2021-05-10 DIAGNOSIS — E1110 Type 2 diabetes mellitus with ketoacidosis without coma: Secondary | ICD-10-CM | POA: Diagnosis not present

## 2021-05-10 DIAGNOSIS — R109 Unspecified abdominal pain: Secondary | ICD-10-CM | POA: Diagnosis not present

## 2021-05-10 DIAGNOSIS — R410 Disorientation, unspecified: Secondary | ICD-10-CM | POA: Diagnosis not present

## 2021-05-10 DIAGNOSIS — E1165 Type 2 diabetes mellitus with hyperglycemia: Secondary | ICD-10-CM | POA: Diagnosis not present

## 2021-05-10 DIAGNOSIS — E860 Dehydration: Secondary | ICD-10-CM | POA: Diagnosis present

## 2021-05-10 DIAGNOSIS — E7800 Pure hypercholesterolemia, unspecified: Secondary | ICD-10-CM

## 2021-05-10 DIAGNOSIS — M6281 Muscle weakness (generalized): Secondary | ICD-10-CM | POA: Diagnosis not present

## 2021-05-10 DIAGNOSIS — K6389 Other specified diseases of intestine: Secondary | ICD-10-CM | POA: Diagnosis not present

## 2021-05-10 DIAGNOSIS — R2681 Unsteadiness on feet: Secondary | ICD-10-CM | POA: Diagnosis not present

## 2021-05-10 DIAGNOSIS — K560 Paralytic ileus: Secondary | ICD-10-CM | POA: Diagnosis present

## 2021-05-10 DIAGNOSIS — R471 Dysarthria and anarthria: Secondary | ICD-10-CM | POA: Diagnosis present

## 2021-05-10 DIAGNOSIS — Z794 Long term (current) use of insulin: Secondary | ICD-10-CM

## 2021-05-10 DIAGNOSIS — Z7401 Bed confinement status: Secondary | ICD-10-CM | POA: Diagnosis not present

## 2021-05-10 DIAGNOSIS — R531 Weakness: Secondary | ICD-10-CM | POA: Diagnosis not present

## 2021-05-10 DIAGNOSIS — F17210 Nicotine dependence, cigarettes, uncomplicated: Secondary | ICD-10-CM | POA: Diagnosis present

## 2021-05-10 DIAGNOSIS — K5939 Other megacolon: Secondary | ICD-10-CM | POA: Diagnosis not present

## 2021-05-10 DIAGNOSIS — R571 Hypovolemic shock: Secondary | ICD-10-CM | POA: Diagnosis not present

## 2021-05-10 DIAGNOSIS — Z8249 Family history of ischemic heart disease and other diseases of the circulatory system: Secondary | ICD-10-CM

## 2021-05-10 DIAGNOSIS — K3189 Other diseases of stomach and duodenum: Secondary | ICD-10-CM | POA: Diagnosis not present

## 2021-05-10 DIAGNOSIS — Z833 Family history of diabetes mellitus: Secondary | ICD-10-CM

## 2021-05-10 DIAGNOSIS — R059 Cough, unspecified: Secondary | ICD-10-CM | POA: Diagnosis present

## 2021-05-10 DIAGNOSIS — E782 Mixed hyperlipidemia: Secondary | ICD-10-CM | POA: Diagnosis present

## 2021-05-10 DIAGNOSIS — N281 Cyst of kidney, acquired: Secondary | ICD-10-CM | POA: Diagnosis not present

## 2021-05-10 DIAGNOSIS — E161 Other hypoglycemia: Secondary | ICD-10-CM | POA: Diagnosis not present

## 2021-05-10 DIAGNOSIS — Z6831 Body mass index (BMI) 31.0-31.9, adult: Secondary | ICD-10-CM

## 2021-05-10 DIAGNOSIS — R4182 Altered mental status, unspecified: Secondary | ICD-10-CM | POA: Diagnosis not present

## 2021-05-10 DIAGNOSIS — R41841 Cognitive communication deficit: Secondary | ICD-10-CM | POA: Diagnosis not present

## 2021-05-10 DIAGNOSIS — S2242XA Multiple fractures of ribs, left side, initial encounter for closed fracture: Secondary | ICD-10-CM | POA: Diagnosis not present

## 2021-05-10 DIAGNOSIS — K56609 Unspecified intestinal obstruction, unspecified as to partial versus complete obstruction: Secondary | ICD-10-CM

## 2021-05-10 DIAGNOSIS — Z79890 Hormone replacement therapy (postmenopausal): Secondary | ICD-10-CM

## 2021-05-10 DIAGNOSIS — Z602 Problems related to living alone: Secondary | ICD-10-CM | POA: Diagnosis present

## 2021-05-10 DIAGNOSIS — R404 Transient alteration of awareness: Secondary | ICD-10-CM | POA: Diagnosis not present

## 2021-05-10 DIAGNOSIS — R4781 Slurred speech: Secondary | ICD-10-CM | POA: Diagnosis not present

## 2021-05-10 DIAGNOSIS — Z888 Allergy status to other drugs, medicaments and biological substances status: Secondary | ICD-10-CM

## 2021-05-10 LAB — CBC WITH DIFFERENTIAL/PLATELET
Abs Immature Granulocytes: 0.1 10*3/uL — ABNORMAL HIGH (ref 0.00–0.07)
Basophils Absolute: 0 10*3/uL (ref 0.0–0.1)
Basophils Relative: 0 %
Eosinophils Absolute: 0 10*3/uL (ref 0.0–0.5)
Eosinophils Relative: 0 %
HCT: 46.2 % — ABNORMAL HIGH (ref 36.0–46.0)
Hemoglobin: 15.8 g/dL — ABNORMAL HIGH (ref 12.0–15.0)
Immature Granulocytes: 1 %
Lymphocytes Relative: 14 %
Lymphs Abs: 1.2 10*3/uL (ref 0.7–4.0)
MCH: 31.8 pg (ref 26.0–34.0)
MCHC: 34.2 g/dL (ref 30.0–36.0)
MCV: 93 fL (ref 80.0–100.0)
Monocytes Absolute: 0.9 10*3/uL (ref 0.1–1.0)
Monocytes Relative: 10 %
Neutro Abs: 6.4 10*3/uL (ref 1.7–7.7)
Neutrophils Relative %: 75 %
Platelets: 223 10*3/uL (ref 150–400)
RBC: 4.97 MIL/uL (ref 3.87–5.11)
RDW: 11.9 % (ref 11.5–15.5)
WBC: 8.6 10*3/uL (ref 4.0–10.5)
nRBC: 0 % (ref 0.0–0.2)

## 2021-05-10 LAB — I-STAT CHEM 8, ED
BUN: >130 mg/dL — ABNORMAL HIGH (ref 8–23)
Calcium, Ion: 0.73 mmol/L — CL (ref 1.15–1.40)
Chloride: 96 mmol/L — ABNORMAL LOW (ref 98–111)
Creatinine, Ser: 9.7 mg/dL — ABNORMAL HIGH (ref 0.44–1.00)
Glucose, Bld: 98 mg/dL (ref 70–99)
HCT: 46 % (ref 36.0–46.0)
Hemoglobin: 15.6 g/dL — ABNORMAL HIGH (ref 12.0–15.0)
Potassium: 4.5 mmol/L (ref 3.5–5.1)
Sodium: 128 mmol/L — ABNORMAL LOW (ref 135–145)
TCO2: 15 mmol/L — ABNORMAL LOW (ref 22–32)

## 2021-05-10 LAB — COMPREHENSIVE METABOLIC PANEL
ALT: 7 U/L (ref 0–44)
AST: 23 U/L (ref 15–41)
Albumin: 3.1 g/dL — ABNORMAL LOW (ref 3.5–5.0)
Alkaline Phosphatase: 61 U/L (ref 38–126)
Anion gap: 38 — ABNORMAL HIGH (ref 5–15)
BUN: 196 mg/dL — ABNORMAL HIGH (ref 8–23)
CO2: 9 mmol/L — ABNORMAL LOW (ref 22–32)
Calcium: 7.6 mg/dL — ABNORMAL LOW (ref 8.9–10.3)
Chloride: 88 mmol/L — ABNORMAL LOW (ref 98–111)
Creatinine, Ser: 9.18 mg/dL — ABNORMAL HIGH (ref 0.44–1.00)
GFR, Estimated: 4 mL/min — ABNORMAL LOW (ref >60)
Glucose, Bld: 93 mg/dL (ref 70–99)
Potassium: 4.5 mmol/L (ref 3.5–5.1)
Sodium: 135 mmol/L (ref 135–145)
Total Bilirubin: 1.5 mg/dL — ABNORMAL HIGH (ref 0.3–1.2)
Total Protein: 6.5 g/dL (ref 6.5–8.1)

## 2021-05-10 LAB — URINALYSIS, ROUTINE W REFLEX MICROSCOPIC
Bilirubin Urine: NEGATIVE
Glucose, UA: 50 mg/dL — AB
Ketones, ur: NEGATIVE mg/dL
Nitrite: NEGATIVE
Protein, ur: NEGATIVE mg/dL
Specific Gravity, Urine: 1.014 (ref 1.005–1.030)
pH: 5 (ref 5.0–8.0)

## 2021-05-10 LAB — RESP PANEL BY RT-PCR (FLU A&B, COVID) ARPGX2
Influenza A by PCR: NEGATIVE
Influenza B by PCR: NEGATIVE
SARS Coronavirus 2 by RT PCR: NEGATIVE

## 2021-05-10 LAB — TSH: TSH: 12.326 u[IU]/mL — ABNORMAL HIGH (ref 0.350–4.500)

## 2021-05-10 LAB — T4, FREE: Free T4: 0.69 ng/dL (ref 0.61–1.12)

## 2021-05-10 LAB — CBG MONITORING, ED
Glucose-Capillary: 62 mg/dL — ABNORMAL LOW (ref 70–99)
Glucose-Capillary: 226 mg/dL — ABNORMAL HIGH (ref 70–99)
Glucose-Capillary: 151 mg/dL — ABNORMAL HIGH (ref 70–99)
Glucose-Capillary: 111 mg/dL — ABNORMAL HIGH (ref 70–99)

## 2021-05-10 LAB — APTT: aPTT: 26 seconds (ref 24–36)

## 2021-05-10 LAB — PROTIME-INR
INR: 1 (ref 0.8–1.2)
Prothrombin Time: 13.6 seconds (ref 11.4–15.2)

## 2021-05-10 LAB — LACTIC ACID, PLASMA
Lactic Acid, Venous: 1.1 mmol/L (ref 0.5–1.9)
Lactic Acid, Venous: 1.2 mmol/L (ref 0.5–1.9)

## 2021-05-10 LAB — CK: Total CK: 111 U/L (ref 38–234)

## 2021-05-10 LAB — POC OCCULT BLOOD, ED: Fecal Occult Bld: POSITIVE — AB

## 2021-05-10 MED ORDER — LACTATED RINGERS IV SOLN: INTRAVENOUS | Status: DC

## 2021-05-10 MED ORDER — PANTOPRAZOLE SODIUM 40 MG PO TBEC
40.0000 mg | DELAYED_RELEASE_TABLET | Freq: Every day | ORAL | Status: DC
  Administered 2021-05-10 – 2021-05-14 (×4): 40 mg via ORAL
  Filled 2021-05-10 (×4): qty 1

## 2021-05-10 MED ORDER — SODIUM CHLORIDE 0.9% FLUSH
3.0000 mL | Freq: Two times a day (BID) | INTRAVENOUS | Status: DC
  Administered 2021-05-10 – 2021-05-13 (×7): 3 mL via INTRAVENOUS

## 2021-05-10 MED ORDER — VANCOMYCIN HCL 1000 MG/200ML IV SOLN: 1000.0000 mg | Freq: Once | INTRAVENOUS | Status: DC

## 2021-05-10 MED ORDER — ATORVASTATIN CALCIUM 40 MG PO TABS
40.0000 mg | ORAL_TABLET | Freq: Every day | ORAL | Status: DC
  Administered 2021-05-11 – 2021-05-14 (×4): 40 mg via ORAL
  Filled 2021-05-10 (×5): qty 1

## 2021-05-10 MED ORDER — SODIUM CHLORIDE 0.9 % IV BOLUS
1000.0000 mL | INTRAVENOUS | Status: AC
  Administered 2021-05-10: 1000 mL via INTRAVENOUS

## 2021-05-10 MED ORDER — SODIUM CHLORIDE 0.9 % IV SOLN
2.0000 g | Freq: Once | INTRAVENOUS | Status: AC
  Administered 2021-05-10: 2 g via INTRAVENOUS
  Filled 2021-05-10: qty 2

## 2021-05-10 MED ORDER — LACTATED RINGERS IV BOLUS (SEPSIS)
500.0000 mL | Freq: Once | INTRAVENOUS | Status: AC
  Administered 2021-05-10: 500 mL via INTRAVENOUS

## 2021-05-10 MED ORDER — ACETAMINOPHEN 325 MG PO TABS: 650.0000 mg | ORAL_TABLET | Freq: Four times a day (QID) | ORAL | Status: DC | PRN

## 2021-05-10 MED ORDER — VANCOMYCIN HCL 1500 MG/300ML IV SOLN
1500.0000 mg | Freq: Once | INTRAVENOUS | Status: AC
  Administered 2021-05-10: 1500 mg via INTRAVENOUS
  Filled 2021-05-10: qty 300

## 2021-05-10 MED ORDER — DEXTROSE 50 % IV SOLN
50.0000 mL | Freq: Once | INTRAVENOUS | Status: AC
  Administered 2021-05-10: 50 mL via INTRAVENOUS
  Filled 2021-05-10: qty 50

## 2021-05-10 MED ORDER — ONDANSETRON HCL 4 MG/2ML IJ SOLN
4.0000 mg | Freq: Four times a day (QID) | INTRAMUSCULAR | Status: DC | PRN
  Administered 2021-05-10: 4 mg via INTRAVENOUS
  Filled 2021-05-10: qty 2

## 2021-05-10 MED ORDER — HEPARIN SODIUM (PORCINE) 5000 UNIT/ML IJ SOLN
5000.0000 [IU] | Freq: Three times a day (TID) | INTRAMUSCULAR | Status: DC
  Administered 2021-05-11 – 2021-05-14 (×8): 5000 [IU] via SUBCUTANEOUS
  Filled 2021-05-10 (×9): qty 1

## 2021-05-10 MED ORDER — ALBUTEROL SULFATE (2.5 MG/3ML) 0.083% IN NEBU: 2.5000 mg | INHALATION_SOLUTION | Freq: Four times a day (QID) | RESPIRATORY_TRACT | Status: DC | PRN

## 2021-05-10 MED ORDER — SODIUM CHLORIDE 0.9 % IV SOLN
1.0000 g | INTRAVENOUS | Status: DC
  Administered 2021-05-11: 1 g via INTRAVENOUS
  Filled 2021-05-10 (×2): qty 1

## 2021-05-10 MED ORDER — SODIUM BICARBONATE 8.4 % IV SOLN
INTRAVENOUS | Status: DC
  Filled 2021-05-10 (×2): qty 1000

## 2021-05-10 MED ORDER — LACTATED RINGERS IV BOLUS (SEPSIS)
1000.0000 mL | Freq: Once | INTRAVENOUS | Status: AC
  Administered 2021-05-10: 1000 mL via INTRAVENOUS

## 2021-05-10 MED ORDER — VANCOMYCIN VARIABLE DOSE PER UNSTABLE RENAL FUNCTION (PHARMACIST DOSING): Status: DC

## 2021-05-10 MED ORDER — LEVOTHYROXINE SODIUM 100 MCG PO TABS
100.0000 ug | ORAL_TABLET | Freq: Every day | ORAL | Status: DC
  Administered 2021-05-11 – 2021-05-14 (×4): 100 ug via ORAL
  Filled 2021-05-10 (×4): qty 1

## 2021-05-10 MED ORDER — SODIUM BICARBONATE 8.4 % IV SOLN: INTRAVENOUS | Status: DC

## 2021-05-10 MED ORDER — METRONIDAZOLE 500 MG/100ML IV SOLN
500.0000 mg | Freq: Once | INTRAVENOUS | Status: AC
  Administered 2021-05-10: 500 mg via INTRAVENOUS
  Filled 2021-05-10: qty 100

## 2021-05-10 MED ORDER — ACETAMINOPHEN 650 MG RE SUPP: 650.0000 mg | Freq: Four times a day (QID) | RECTAL | Status: DC | PRN

## 2021-05-10 MED ORDER — DEXTROSE 50 % IV SOLN: 50.0000 mL | INTRAVENOUS | Status: DC | PRN

## 2021-05-10 NOTE — ED Notes (Addendum)
RN and ED [facility] attempted to I & O cath. Scant urine was drawn off the cath. MD Adela Lank notified.

## 2021-05-10 NOTE — ED Provider Notes (Signed)
[facility] EMERGENCY DEPARTMENT Provider Note   CSN: [id number] Arrival date & time: 05/10/21  9381     History Chief Complaint  Patient presents with   Code Sepsis    Kristina Gates is a 65 y.o. female.  65 yo F with a chief complaints of confusion and hypertension.  Patient lives alone by herself and normally communicates with her family via telephone.  They live in [place] and had last talked to her on Friday.  At that time she was not feeling well and told them that she did not really feel like eating or drinking.  Since then they have not been able to get in contact with her.  They called a family friend who was able to come over to her house this morning and found her sitting on the floor in a pool of her urine and feces.  With EMS her initial blood sugar was in the 20s.  Given oral glucose and a D10 infusion with some improvement.  Patient is mildly confused on arrival.  Is not sure what is going on.  Tells me that she has no pain anywhere.  Tells me that she did not fall as far she can tell.  The history is provided by the patient.  Illness Severity:  Moderate Onset quality:  Gradual Duration:  2 days Timing:  Constant Progression:  Worsening Chronicity:  New Associated symptoms: no chest pain, no congestion, no fever, no headaches, no myalgias, no nausea, no rhinorrhea, no shortness of breath, no vomiting and no wheezing       Past Medical History:  Diagnosis Date   Diabetes mellitus with renal complications (HCC) 2022   AKI in context of rhabdo   Hypercholesteremia    Hypertension    Postprocedural hypothyroidism     Patient Active Problem List   Diagnosis Date Noted   Neutropenia (HCC) 02/10/2021   Mass of left side of neck 02/02/2021   SVT (supraventricular tachycardia) (HCC)    Traumatic rhabdomyolysis (HCC) 01/22/2021   Diabetes mellitus with renal complications (HCC)    Essential hypertension 01/21/2021   Hyperglycemia due to type 2  diabetes mellitus (HCC) 01/21/2021   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/2021   Pure hypercholesterolemia 01/21/2021   Sepsis (HCC) 01/21/2021    Past Surgical History:  Procedure Laterality Date   COLON SURGERY       OB History   No obstetric history on file.     Family History  Problem Relation Age of Onset   Diabetes Mother    Hypertension Mother    Cancer Father    Stroke Father     Social History   Tobacco Use   Smoking status: Some Days    Pack years: 0.00    Types: Cigarettes   Smokeless tobacco: Never  Vaping Use   Vaping Use: Never used  Substance Use Topics   Alcohol use: Yes    Comment: occasional   Drug use: Never    Home Medications Prior to Admission medications   Medication Sig Start Date End Date Taking? Authorizing Provider  acetaminophen (TYLENOL) 325 MG tablet Take 650 mg by mouth every 6 (six) hours as needed for moderate pain.    [provider]  atorvastatin (LIPITOR) 40 MG tablet Take 40 mg by mouth daily. 01/19/21   [provider]  diltiazem (CARDIZEM CD) 180 MG 24 hr capsule Take 1 capsule (180 mg total) by mouth daily. 02/02/21   Regalado, Prentiss Bells, MD  docusate  sodium (COLACE) 100 MG capsule Take 1 capsule (100 mg total) by mouth 2 (two) times daily. Patient taking differently: Take 100 mg by mouth daily as needed. 02/01/21   Regalado, Belkys A, MD  hydrALAZINE (APRESOLINE) 10 MG tablet Take 10 mg by mouth 3 (three) times daily. For HTN    [provider]  losartan (COZAAR) 100 MG tablet Take 100 mg by mouth daily. 01/19/21   [provider]  magnesium oxide (MAG-OX) 400 (241.3 Mg) MG tablet Take 0.5 tablets (200 mg total) by mouth 2 (two) times daily. 02/01/21   Regalado, Belkys A, MD  methocarbamol (ROBAXIN) 500 MG tablet Take 1 tablet (500 mg total) by mouth every 8 (eight) hours as needed for muscle spasms. 02/01/21   Regalado, Belkys A, MD  Multiple Vitamin (MULTIVITAMIN WITH MINERALS) TABS tablet Take 1  tablet by mouth daily.    [provider]  pantoprazole (PROTONIX) 40 MG tablet Take 1 tablet (40 mg total) by mouth 2 (two) times daily. 02/01/21   Regalado, Belkys A, MD  sodium chloride (OCEAN) 0.65 % SOLN nasal spray Place 1 spray into both nostrils daily as needed for congestion.    [provider]  SYNTHROID 100 MCG tablet Take 100 mcg by mouth daily. 01/19/21   [provider]    Allergies    Metformin and related and Zestril [lisinopril]  Review of Systems   Review of Systems  Unable to perform ROS: Mental status change  Constitutional:  Negative for chills and fever.  HENT:  Negative for congestion and rhinorrhea.   Eyes:  Negative for redness and visual disturbance.  Respiratory:  Negative for shortness of breath and wheezing.   Cardiovascular:  Negative for chest pain and palpitations.  Gastrointestinal:  Negative for nausea and vomiting.  Genitourinary:  Negative for dysuria and urgency.  Musculoskeletal:  Negative for arthralgias and myalgias.  Skin:  Negative for pallor and wound.  Neurological:  Negative for dizziness and headaches.   Physical Exam Updated Vital Signs BP (!) 116/58   Pulse (!) 52   Temp 98.5 F (36.9 C) (Rectal)   Resp (!) 23   Wt 79.4 kg   SpO2 90%   BMI 31.00 kg/m   Physical Exam Vitals and nursing note reviewed.  Constitutional:      General: She is not in acute distress.    Appearance: She is well-developed. She is not diaphoretic.  HENT:     Head: Normocephalic and atraumatic.  Eyes:     Pupils: Pupils are equal, round, and reactive to light.  Cardiovascular:     Rate and Rhythm: Normal rate and regular rhythm.     Heart sounds: No murmur heard.   No friction rub. No gallop.  Pulmonary:     Effort: Pulmonary effort is normal.     Breath sounds: No wheezing or rales.  Abdominal:     General: There is no distension.     Palpations: Abdomen is soft.     Tenderness: There is no abdominal tenderness.   Musculoskeletal:        General: No tenderness.     Cervical back: Normal range of motion and neck supple.  Skin:    General: Skin is warm and dry.  Neurological:     Mental Status: She is alert.     Comments: Confused   Psychiatric:        Behavior: Behavior normal.    ED Results / Procedures / Treatments   Labs (all  labs ordered are listed, but only abnormal results are displayed) Labs Reviewed  COMPREHENSIVE METABOLIC PANEL - Abnormal; Notable for the following components:      Result Value   Chloride 88 (*)    CO2 9 (*)    BUN 196 (*)    Creatinine, Ser 9.18 (*)    Calcium 7.6 (*)    Albumin 3.1 (*)    Total Bilirubin 1.5 (*)    GFR, Estimated 4 (*)    Anion gap 38 (*)    All other components within normal limits  CBC WITH DIFFERENTIAL/PLATELET - Abnormal; Notable for the following components:   Hemoglobin 15.8 (*)    HCT 46.2 (*)    Abs Immature Granulocytes 0.10 (*)    All other components within normal limits  TSH - Abnormal; Notable for the following components:   TSH 12.326 (*)    All other components within normal limits  I-STAT CHEM 8, ED - Abnormal; Notable for the following components:   Sodium 128 (*)    Chloride 96 (*)    BUN >130 (*)    Creatinine, Ser 9.70 (*)    Calcium, Ion 0.73 (*)    TCO2 15 (*)    Hemoglobin 15.6 (*)    All other components within normal limits  CBG MONITORING, ED - Abnormal; Notable for the following components:   Glucose-Capillary 62 (*)    All other components within normal limits  CBG MONITORING, ED - Abnormal; Notable for the following components:   Glucose-Capillary 226 (*)    All other components within normal limits  CBG MONITORING, ED - Abnormal; Notable for the following components:   Glucose-Capillary 151 (*)    All other components within normal limits  RESP PANEL BY RT-PCR (FLU A&B, COVID) ARPGX2  CULTURE, BLOOD (ROUTINE X 2)  CULTURE, BLOOD (ROUTINE X 2)  URINE CULTURE  LACTIC ACID, PLASMA  PROTIME-INR   APTT  CK  T4, FREE  LACTIC ACID, PLASMA  URINALYSIS, ROUTINE W REFLEX MICROSCOPIC    EKG EKG Interpretation  Date/Time:  Monday May 10 2021 09:51:50 EDT Ventricular Rate:  65 PR Interval:  144 QRS Duration: 97 QT Interval:  421 QTC Calculation: 438 R Axis:   62 Text Interpretation: Sinus rhythm Probable left atrial enlargement Anteroseptal infarct, age indeterminate No significant change since last tracing Confirmed by Melene PlanFloyd, Josslynn Mentzer [phone]([id number]) on 05/10/2021 10:20:18 AM  Radiology DG Chest Port 1 View  Result Date: 05/10/2021 CLINICAL DATA:  Questionable sepsis EXAM: PORTABLE CHEST 1 VIEW COMPARISON:  01/21/2021 FINDINGS: The heart size and mediastinal contours are within normal limits. Both lungs are clear. Nonacute, callused fractures of the posterolateral left ribs. IMPRESSION: 1. No acute abnormality of the lungs in AP portable projection. 2. Nonacute, callused fractures of the posterolateral left ribs. Electronically Signed   By: Lauralyn PrimesAlex  Bibbey M.D.   On: 05/10/2021 11:42    Procedures Procedures  Procedure note: Ultrasound Guided Peripheral IV Ultrasound guided peripheral 1.88 inch angiocath IV placement performed by me. Indications: Nursing unable to place IV. Details: The antecubital fossa and upper arm were evaluated with a multifrequency linear probe. Patent brachial veins were noted. 1 attempt was made to cannulate a vein under realtime US guidance with successful cannulation of the vein and catheter placement. There is return of non-pulsatile dark red blood. The patient tolerated the procedure well without complications. Images archived electronically.  CPT codes: [id number] and [id number]  Medications Ordered in ED Medications  lactated ringers infusion (has no administration in  time range)  lactated ringers bolus 1,000 mL (0 mLs Intravenous Stopped 05/10/21 1235)    And  lactated ringers bolus 1,000 mL (0 mLs Intravenous Stopped 05/10/21 1235)    And  lactated ringers bolus 500 mL  (500 mLs Intravenous New Bag/Given 05/10/21 1236)  vancomycin (VANCOREADY) IVPB 1500 mg/300 mL (1,500 mg Intravenous New Bag/Given 05/10/21 1238)  ceFEPIme (MAXIPIME) 2 g in sodium chloride 0.9 % 100 mL IVPB (0 g Intravenous Stopped 05/10/21 1236)  metroNIDAZOLE (FLAGYL) IVPB 500 mg (500 mg Intravenous New Bag/Given 05/10/21 1052)  dextrose 50 % solution 50 mL (50 mLs Intravenous Given 05/10/21 1043)    ED Course  I have reviewed the triage vital signs and the nursing notes.  Pertinent labs & imaging results that were available during my care of the patient were reviewed by me and considered in my medical decision making (see chart for details).    MDM Rules/Calculators/A&P                          65 yo F with a cc of altered mental status.  Patient had been found on the ground after telling her family that she was not feeling well and not really eating or drinking.  Patient had what sounds like a similar admission about 3 months ago on record review.  We will obtain a laboratory evaluation.  She was hypothermic with EMS we will start code sepsis protocol with her hypotension.  Hyperglycemic here we will give a bolus of D50.  Reassess.  Some delay on second IV line, [place] placed by myself.   I-STAT labs concerning for acute renal failure.  Mild hyponatremia.  Given 30 cc/kg of IV fluids per sepsis protocol.  Lactic A normal.  Dry in and out cath.  Anuric. Discussed with Dr. Hyman Hopes, nephrology agrees with medical admission and they will evaluate at bedside.    CRITICAL CARE Performed by: Rae Roam   Total critical care time: 35 minutes  Critical care time was exclusive of separately billable procedures and treating other patients.  Critical care was necessary to treat or prevent imminent or life-threatening deterioration.  Critical care was time spent personally by me on the following activities: development of treatment plan with patient and/or surrogate as well as nursing,  discussions with consultants, evaluation of patient's response to treatment, examination of patient, obtaining history from patient or surrogate, ordering and performing treatments and interventions, ordering and review of laboratory studies, ordering and review of radiographic studies, pulse oximetry and re-evaluation of patient's condition.  The patients results and plan were reviewed and discussed.   Any x-rays performed were independently reviewed by myself.   Differential diagnosis were considered with the presenting HPI.  Medications  lactated ringers infusion (has no administration in time range)  lactated ringers bolus 1,000 mL (0 mLs Intravenous Stopped 05/10/21 1235)    And  lactated ringers bolus 1,000 mL (0 mLs Intravenous Stopped 05/10/21 1235)    And  lactated ringers bolus 500 mL (500 mLs Intravenous New Bag/Given 05/10/21 1236)  vancomycin (VANCOREADY) IVPB 1500 mg/300 mL (1,500 mg Intravenous New Bag/Given 05/10/21 1238)  ceFEPIme (MAXIPIME) 2 g in sodium chloride 0.9 % 100 mL IVPB (0 g Intravenous Stopped 05/10/21 1236)  metroNIDAZOLE (FLAGYL) IVPB 500 mg (500 mg Intravenous New Bag/Given 05/10/21 1052)  dextrose 50 % solution 50 mL (50 mLs Intravenous Given 05/10/21 1043)    Vitals:   05/10/21 1040 05/10/21 1108 05/10/21 1139  05/10/21 1200  BP: 104/66 (!) 92/42  (!) 116/58  Pulse: (!) 117 63  (!) 52  Resp: (!) 24 (!) 23  (!) 23  Temp:   98.5 F (36.9 C)   TempSrc:   Rectal   SpO2: (!) 75% 100%  90%  Weight:        Final diagnoses:  Hypovolemic shock (HCC)  AKI (acute kidney injury) (HCC)  Hypoglycemia    Admission/ observation were discussed with the admitting physician, patient and/or family and they are comfortable with the plan.    Final Clinical Impression(s) / ED Diagnoses Final diagnoses:  Hypovolemic shock (HCC)  AKI (acute kidney injury) (HCC)  Hypoglycemia    Rx / DC Orders ED Discharge Orders     None        Melene Plan, DO 05/10/21  1246

## 2021-05-10 NOTE — ED Triage Notes (Signed)
BIB [facility] after neighbor called to report that pt had increase confusion and slurred speech. Per EMS, CBG was 32 on scene, pt given D10 ( 10 g), CBG 109 resolved speech issues. EMS also bolused 250 cc of NS. Pt family endorses pt has increased weakness/confusion since Wednesday.    HX: UTI, DM

## 2021-05-10 NOTE — ED Notes (Signed)
Patient soiled the bed with watery feces present

## 2021-05-10 NOTE — Consult Note (Addendum)
[facility] Renal Consultation Note  Requesting MD:  Indication for Consultation:   HPI: Kristina Gates is a 65 y.o. female.  Who has a history of diabetes type 2, hypertension.  She was admitted recently to [facility] 01/21/2021 and discharged 02/01/2021.  During hospitalization it was noted that she had recurrent episodes of SVT.  Cardiology was consulted and she was placed on Cardizem.  She was also found to have a UTI and possible sepsis.  She was discharged off antibiotics.  She also had an element of nontraumatic rhabdomyolysis with a CPK level of greater than 20,000.  She was brought back to the emergency room on 05/10/2021 after it was noted that she had failed to contact her family since Friday.  She had no appetite had not been eating and drinking.  When the emergency room came in and found her to have a blood glucose of 20.  She was brought to the emergency room and thought to be severely dehydrated.  She was resuscitated with IV fluids.  Sodium 128 potassium 4.5 chloride 96 CO2 9 glucose 98 BUN greater than 130 creatinine 9.7 CPK 111.  Hemoglobin 15.6 WBC 8.6 platelets 223.  Free T4 0.6  Blood pressure 75/48 pulse 103 temperature 98.5 O2 sats 90% room air.  Resuscitation 2.1 L noted.  Atorvastatin 40 mg daily levothyroxine 100 mcg daily Protonix 40 mg daily.  Home medications diltiazem, hydralazine, losartan.  Creatinine, Ser  Date/Time Value Ref Range Status  05/10/2021 10:31 AM 9.70 (H) 0.44 - 1.00 mg/dL Final  86/57/8469 62:95 AM 9.18 (H) 0.44 - 1.00 mg/dL Final  28/41/3244 01:02 AM 0.66 0.44 - 1.00 mg/dL Final  72/53/6644 03:47 AM 0.68 0.44 - 1.00 mg/dL Final  42/59/5638 75:64 AM 0.55 0.44 - 1.00 mg/dL Final  33/29/5188 41:66 AM 0.46 0.44 - 1.00 mg/dL Final  05/20/1600 09:32 AM 0.50 0.44 - 1.00 mg/dL Final  35/57/3220 25:42 AM 0.51 0.44 - 1.00 mg/dL Final  70/62/3762 83:15 AM 0.56 0.44 - 1.00 mg/dL Final  17/61/6073 71:06 AM 0.66 0.44 - 1.00 mg/dL  Final  26/94/8546 27:03 AM 0.86 0.44 - 1.00 mg/dL Final  50/07/3817 29:93 PM 0.96 0.44 - 1.00 mg/dL Final  71/69/6789 38:10 AM 1.27 (H) 0.44 - 1.00 mg/dL Final     PMHx:   Past Medical History:  Diagnosis Date   Diabetes mellitus with renal complications (HCC) 2022   AKI in context of rhabdo   Hypercholesteremia    Hypertension    Postprocedural hypothyroidism     Past Surgical History:  Procedure Laterality Date   COLON SURGERY      Family Hx:  Family History  Problem Relation Age of Onset   Diabetes Mother    Hypertension Mother    Cancer Father    Stroke Father     Social History:  reports that she has been smoking cigarettes. She has never used smokeless tobacco. She reports current alcohol use. She reports that she does not use drugs.  Allergies:  Allergies  Allergen Reactions   Metformin And Related Cough   Zestril [Lisinopril] Cough    Medications: Prior to Admission medications   Medication Sig Start Date End Date Taking? Authorizing Provider  acetaminophen (TYLENOL) 500 MG tablet Take 500-1,000 mg by mouth every 6 (six) hours as needed for headache (pain).   Yes [provider]  atorvastatin (LIPITOR) 40 MG tablet Take 40 mg by mouth daily with supper. 01/19/21  Yes [provider]  diltiazem (CARDIZEM CD)  180 MG 24 hr capsule Take 1 capsule (180 mg total) by mouth daily. Patient taking differently: Take 180 mg by mouth every morning. 02/02/21  Yes Regalado, Belkys A, MD  docusate sodium (COLACE) 100 MG capsule Take 1 capsule (100 mg total) by mouth 2 (two) times daily. Patient taking differently: Take 100 mg by mouth 2 (two) times daily as needed (constipation). 02/01/21  Yes Regalado, Belkys A, MD  hydrALAZINE (APRESOLINE) 10 MG tablet Take 10 mg by mouth 3 (three) times daily with meals. For HTN   Yes [provider]  insulin aspart protamine - aspart (NOVOLOG MIX 70/30 FLEXPEN) (70-30) 100 UNIT/ML FlexPen Inject 12 Units into the  skin daily with supper.   Yes [provider]  levothyroxine (SYNTHROID) 100 MCG tablet Take 100 mcg by mouth daily before breakfast.   Yes [provider]  losartan (COZAAR) 100 MG tablet Take 100 mg by mouth every morning. 01/19/21  Yes [provider]  Multiple Vitamin (MULTIVITAMIN WITH MINERALS) TABS tablet Take 1 tablet by mouth daily with lunch.   Yes [provider]  pantoprazole (PROTONIX) 40 MG tablet Take 1 tablet (40 mg total) by mouth 2 (two) times daily. Patient not taking: No sig reported 02/01/21 05/10/21  Regalado, Jon BillingsBelkys A, MD    I have reviewed the patient's current medications.  Labs:  Results for orders placed or performed during the hospital encounter of 05/10/21 (from the past 48 hour(s))  CBG monitoring, ED     Status: Abnormal   Collection Time: 05/10/21  9:47 AM  Result Value Ref Range   Glucose-Capillary 62 (L) 70 - 99 mg/dL    Comment: Glucose reference range applies only to samples taken after fasting for at least 8 hours.  Resp Panel by RT-PCR (Flu A&B, Covid) Nasopharyngeal Swab     Status: None   Collection Time: 05/10/21  9:52 AM   Specimen: Nasopharyngeal Swab; Nasopharyngeal(NP) swabs in vial transport medium  Result Value Ref Range   SARS Coronavirus 2 by RT PCR NEGATIVE NEGATIVE    Comment: (NOTE) SARS-CoV-2 target nucleic acids are NOT DETECTED.  The SARS-CoV-2 RNA is generally detectable in upper respiratory specimens during the acute phase of infection. The lowest concentration of SARS-CoV-2 viral copies this assay can detect is 138 copies/mL. A negative result does not preclude SARS-Cov-2 infection and should not be used as the sole basis for treatment or other patient management decisions. A negative result may occur with  improper specimen collection/handling, submission of specimen other than nasopharyngeal swab, presence of viral mutation(s) within the areas targeted by this assay, and inadequate number of  viral copies(<138 copies/mL). A negative result must be combined with clinical observations, patient history, and epidemiological information. The expected result is Negative.  Fact Sheet for Patients:  BloggerCourse.comhttps://www.fda.gov/media/152166/download  Fact Sheet for Healthcare Providers:  SeriousBroker.ithttps://www.fda.gov/media/152162/download  This test is no t yet approved or cleared by the [place] FDA and  has been authorized for detection and/or diagnosis of SARS-CoV-2 by FDA under an Emergency Use Authorization (EUA). This EUA will remain  in effect (meaning this test can be used) for the duration of the COVID-19 declaration under Section 564(b)(1) of the Act, 21 U.S.C.section 360bbb-3(b)(1), unless the authorization is terminated  or revoked sooner.       Influenza A by PCR NEGATIVE NEGATIVE   Influenza B by PCR NEGATIVE NEGATIVE    Comment: (NOTE) The Xpert Xpress SARS-CoV-2/FLU/RSV plus assay is intended as an aid in the diagnosis of influenza  from Nasopharyngeal swab specimens and should not be used as a sole basis for treatment. Nasal washings and aspirates are unacceptable for Xpert Xpress SARS-CoV-2/FLU/RSV testing.  Fact Sheet for Patients: BloggerCourse.com  Fact Sheet for Healthcare Providers: SeriousBroker.it  This test is not yet approved or cleared by the [place] FDA and has been authorized for detection and/or diagnosis of SARS-CoV-2 by FDA under an Emergency Use Authorization (EUA). This EUA will remain in effect (meaning this test can be used) for the duration of the COVID-19 declaration under Section 564(b)(1) of the Act, 21 U.S.C. section 360bbb-3(b)(1), unless the authorization is terminated or revoked.  Performed at [facility], [address] [place], [place] [address]   TSH     Status: Abnormal   Collection Time: 05/10/21  9:54 AM  Result Value Ref Range   TSH 12.326 (H) 0.350 - 4.500 uIU/mL     Comment: Performed by a 3rd Generation assay with a functional sensitivity of <=0.01 uIU/mL. Performed at [facility], [address], [place], [place] [address]   Lactic acid, plasma     Status: None   Collection Time: 05/10/21 10:20 AM  Result Value Ref Range   Lactic Acid, Venous 1.1 0.5 - 1.9 mmol/L    Comment: Performed at [facility], [address], [place], [place] [address]  Comprehensive metabolic panel     Status: Abnormal   Collection Time: 05/10/21 10:20 AM  Result Value Ref Range   Sodium 135 135 - 145 mmol/L   Potassium 4.5 3.5 - 5.1 mmol/L   Chloride 88 (L) 98 - 111 mmol/L   CO2 9 (L) 22 - 32 mmol/L   Glucose, Bld 93 70 - 99 mg/dL    Comment: Glucose reference range applies only to samples taken after fasting for at least 8 hours.   BUN 196 (H) 8 - 23 mg/dL   Creatinine, Ser 4.19 (H) 0.44 - 1.00 mg/dL   Calcium 7.6 (L) 8.9 - 10.3 mg/dL   Total Protein 6.5 6.5 - 8.1 g/dL   Albumin 3.1 (L) 3.5 - 5.0 g/dL   AST 23 15 - 41 U/L   ALT 7 0 - 44 U/L   Alkaline Phosphatase 61 38 - 126 U/L   Total Bilirubin 1.5 (H) 0.3 - 1.2 mg/dL   GFR, Estimated 4 (L) >60 mL/min    Comment: (NOTE) Calculated using the CKD-EPI Creatinine Equation (2021)    Anion gap 38 (H) 5 - 15    Comment: REPEATED TO VERIFY Performed at [facility], [address], [place], [place] [address]   CBC WITH DIFFERENTIAL     Status: Abnormal   Collection Time: 05/10/21 10:20 AM  Result Value Ref Range   WBC 8.6 4.0 - 10.5 K/uL   RBC 4.97 3.87 - 5.11 MIL/uL   Hemoglobin 15.8 (H) 12.0 - 15.0 g/dL   HCT 79.8 (H) 92.1 - 19.4 %   MCV 93.0 80.0 - 100.0 fL   MCH 31.8 26.0 - 34.0 pg   MCHC 34.2 30.0 - 36.0 g/dL   RDW 17.4 08.1 - 44.8 %   Platelets 223 150 - 400 K/uL   nRBC 0.0 0.0 - 0.2 %   Neutrophils Relative % 75 %   Neutro Abs 6.4 1.7 - 7.7 K/uL   Lymphocytes Relative 14 %   Lymphs Abs 1.2 0.7 - 4.0 K/uL   Monocytes Relative 10 %   Monocytes Absolute 0.9 0.1 - 1.0 K/uL  Eosinophils Relative 0 %   Eosinophils Absolute 0.0 0.0 - 0.5 K/uL   Basophils Relative 0 %   Basophils Absolute 0.0 0.0 - 0.1 K/uL   Immature Granulocytes 1 %   Abs Immature Granulocytes 0.10 (H) 0.00 - 0.07 K/uL    Comment: Performed at [facility], [address], [place], [place] [address]  Protime-INR     Status: None   Collection Time: 05/10/21 10:20 AM  Result Value Ref Range   Prothrombin Time 13.6 11.4 - 15.2 seconds   INR 1.0 0.8 - 1.2    Comment: (NOTE) INR goal varies based on device and disease states. Performed at [facility], [address], [place], [place] [address]   APTT     Status: None   Collection Time: 05/10/21 10:20 AM  Result Value Ref Range   aPTT 26 24 - 36 seconds    Comment: Performed at [facility], [address] [place], [place] [address]  CK     Status: None   Collection Time: 05/10/21 10:20 AM  Result Value Ref Range   Total CK 111 38 - 234 U/L    Comment: Performed at [facility], [address], [place], [place] [address]  I-stat chem 8, ED (not at [facility] or [facility])     Status: Abnormal   Collection Time: 05/10/21 10:31 AM  Result Value Ref Range   Sodium 128 (L) 135 - 145 mmol/L   Potassium 4.5 3.5 - 5.1 mmol/L   Chloride 96 (L) 98 - 111 mmol/L   BUN >130 (H) 8 - 23 mg/dL   Creatinine, Ser 9.83 (H) 0.44 - 1.00 mg/dL   Glucose, Bld 98 70 - 99 mg/dL    Comment: Glucose reference range applies only to samples taken after fasting for at least 8 hours.   Calcium, Ion 0.73 (LL) 1.15 - 1.40 mmol/L   TCO2 15 (L) 22 - 32 mmol/L   Hemoglobin 15.6 (H) 12.0 - 15.0 g/dL   HCT 38.2 50.5 - 39.7 %   Comment NOTIFIED PHYSICIAN   T4, free     Status: None   Collection Time: 05/10/21 10:45 AM  Result Value Ref Range   Free T4 0.69 0.61 - 1.12 ng/dL    Comment: (NOTE) Biotin ingestion may interfere with free T4 tests. If the results are inconsistent with the TSH level, previous test results, or the clinical presentation, then  consider biotin interference. If needed, order repeat testing after stopping biotin. Performed at [facility], [address] [place], [place] [address]   CBG monitoring, ED     Status: Abnormal   Collection Time: 05/10/21 11:17 AM  Result Value Ref Range   Glucose-Capillary 226 (H) 70 - 99 mg/dL    Comment: Glucose reference range applies only to samples taken after fasting for at least 8 hours.   Comment 1 Notify RN   Lactic acid, plasma     Status: None   Collection Time: 05/10/21 11:52 AM  Result Value Ref Range   Lactic Acid, Venous 1.2 0.5 - 1.9 mmol/L    Comment: Performed at [facility], [address] [place], [place] [address]  CBG monitoring, ED     Status: Abnormal   Collection Time: 05/10/21 12:33 PM  Result Value Ref Range   Glucose-Capillary 151 (H) 70 - 99 mg/dL    Comment: Glucose reference range applies only to samples taken after fasting for at least  8 hours.     ROS:  A comprehensive review of systems was negative except for: Constitutional: positive for anorexia  Physical Exam: Vitals:   05/10/21 1200 05/10/21 1230  BP: (!) 116/58 (!) 106/47  Pulse: (!) 52 64  Resp: (!) 23 (!) 25  Temp:    SpO2: 90% 100%     General: No acute distress HEENT: Normocephalic atraumatic Eyes: Pupils round equal reactive to light Neck: Supple no thyromegaly or adenopathy Heart: JVP not elevated no murmurs rubs or gallop Lungs:  clear to auscultation Abdomen: Soft nontender bowel sounds present Extremities: No cyanosis clubbing or edema Skin: No skin rash or itching Neuro: Confused  Assessment/Plan: Acute kidney injury.  This seems to be in the setting of hypertension volume depletion and concurrent use of ARB.  It is unclear whether she has been taking her angiotensin receptor blocker.  We will check a renal ultrasound.  It does appear that the last serum creatinine was in the normal range in March 2022.  Will order urinalysis.  Avoid nephrotoxins renal  panel daily.  I have a concern about using vancomycin.  I would limit this to 1 dose.  Place Foley catheter monitor urine output.  There does not appear to be any evidence of rhabdomyolysis 2. Hypertension/volume  -appears to be volume depleted.  Would recommend replacement with IV fluids.  She has metabolic acidosis therefore sodium bicarbonate would be indicated. 3.  Metabolic acidosis.  We will start IV bicarbonate patient in positive fluid balance 4.  Hypotension.  This is possibly secondary to sepsis.  She does have a history of atrial fibrillation and was taking antihypertensive medications at home. 5.  Altered mental status probably secondary to acute kidney injury hypotension and sepsis 6.  Hyponatremia appears to be secondary to volume depletion   Garnetta Buddy 05/10/2021, 3:00 PM

## 2021-05-10 NOTE — Progress Notes (Signed)
Pharmacy Antibiotic Note  Kristina Gates is a 65 y.o. female admitted on 05/10/2021 with sepsis.  Pharmacy has been consulted for Vanc and Cefepime dosing. No history of kidney disease, likely AKI Scr 9.7   Plan: Cefepime 2 gm IV x 1, then 1 gm IV q24hr Vanc 1500 mg IV x 1, then vanc variable dosing based on levels due to AKI Monitor renal function, clinicals status, C&S and vanc levels as needed.  Weight: 79.4 kg (175 lb)  Temp (24hrs), Avg:98.4 F (36.9 C), Min:98.2 F (36.8 C), Max:98.5 F (36.9 C)  Recent Labs  Lab 05/10/21 1020 05/10/21 1031 05/10/21 1152  WBC 8.6  --   --   CREATININE 9.18* 9.70*  --   LATICACIDVEN 1.1  --  1.2    Estimated Creatinine Clearance: 5.6 mL/min (A) (by C-G formula based on SCr of 9.7 mg/dL (H)).    Allergies  Allergen Reactions   Metformin And Related Cough   Zestril [Lisinopril] Cough    Antimicrobials this admission: Vanc 6/20 >>  Cefepime 6/20 >>  Flagyl 6/20 >>   Thank you for allowing pharmacy to be a part of this patient's care.  Jeanella Cara, PharmD, [facility] Clinical Pharmacist Please see AMION for all Pharmacists' Contact Phone Numbers 05/10/2021, 2:15 PM

## 2021-05-10 NOTE — H&P (Addendum)
History and Physical    Kristina Gates QBV:[id number] DOB: 25-Mar-1956 DOA: 05/10/2021  Referring MD/NP/PA: Melene Plan, MD PCP: Dorisann Frames, MD  Patient coming from: Home(lives alone) via EMS  Chief Complaint: Confused with slurred speech  I have personally briefly reviewed patient's old medical records in [facility]   HPI: Kristina Gates is a 65 y.o. female with medical history significant of hypertension, hyperlipidemia, hypothyroidism, and diabetes mellitus type 2 presents after being found on her laying on the couch by her friend and former coworker this morning confused and with slurred speech.  History is mostly obtained from the patient's friend who found her this morning.  Apparently family had last spoken to the patient 3 days ago.  At that time she complained of feeling under the weather and did not feel like eating or drinking.  Family was not able to reach her over the weekend, but were able to get in contact with her friend to go check on her this morning.  She found her laying on the couch in urine and feces.  Patient was very confused and her speech was slurred.  Normally patient is alert and oriented x3.  She complains of pain in her abdomen, but denied having any falls.  Her friend noticed that the last date that she turned over her calendar was 4 days ago, and she can only marked off some of her meds 3 days ago.  En route with EMS patient was noted to have a glucose of 32 on the scene and had been given D10 with a repeat blood glucose 109.  EMS also bolused 250\\ ml normal saline IV fluids.  ED Course: Upon admission into the emergency department patient was seen to be afebrile, pulse 50-100, respiration 23-27, blood pressure 97/37-116/58, and O2 saturations currently maintained on room air.  Labs significant for CO2 9, BUN 196, creatinine 9.18, glucose checks as low as 62, albumin 3.1, total bilirubin 1.5 CK 111, TSH 12.326, and lactic acid 1.1.  She had been given 2.5 L  of lactated Ringer's, and amp of D50, vancomycin, cefepime, and metronidazole.  TRH was called to admit.   Review of Systems  Unable to perform ROS: Mental status change  Constitutional:  Positive for malaise/fatigue. Negative for fever.  HENT:  Negative for congestion and sinus pain.   Eyes:  Negative for photophobia.  Respiratory:  Negative for cough and shortness of breath.   Cardiovascular:  Negative for chest pain and leg swelling.  Gastrointestinal:  Positive for abdominal pain. Negative for nausea and vomiting.  Musculoskeletal:  Negative for falls.  Neurological:  Positive for weakness. Negative for focal weakness.  Psychiatric/Behavioral:  Positive for memory loss. Negative for substance abuse.    Past Medical History:  Diagnosis Date   Diabetes mellitus with renal complications (HCC) 2022   AKI in context of rhabdo   Hypercholesteremia    Hypertension    Postprocedural hypothyroidism     Past Surgical History:  Procedure Laterality Date   COLON SURGERY       reports that she has been smoking cigarettes. She has never used smokeless tobacco. She reports current alcohol use. She reports that she does not use drugs.  Allergies  Allergen Reactions   Metformin And Related Cough   Zestril [Lisinopril] Cough    Family History  Problem Relation Age of Onset   Diabetes Mother    Hypertension Mother    Cancer Father    Stroke Father  Prior to Admission medications   Medication Sig Start Date End Date Taking? Authorizing Provider  acetaminophen (TYLENOL) 325 MG tablet Take 650 mg by mouth every 6 (six) hours as needed for moderate pain.    [provider]  atorvastatin (LIPITOR) 40 MG tablet Take 40 mg by mouth daily. 01/19/21   [provider]  diltiazem (CARDIZEM CD) 180 MG 24 hr capsule Take 1 capsule (180 mg total) by mouth daily. 02/02/21   Regalado, Belkys A, MD  docusate sodium (COLACE) 100 MG capsule Take 1 capsule (100 mg total) by mouth 2  (two) times daily. Patient taking differently: Take 100 mg by mouth daily as needed. 02/01/21   Regalado, Belkys A, MD  hydrALAZINE (APRESOLINE) 10 MG tablet Take 10 mg by mouth 3 (three) times daily. For HTN    [provider]  losartan (COZAAR) 100 MG tablet Take 100 mg by mouth daily. 01/19/21   [provider]  magnesium oxide (MAG-OX) 400 (241.3 Mg) MG tablet Take 0.5 tablets (200 mg total) by mouth 2 (two) times daily. 02/01/21   Regalado, Belkys A, MD  methocarbamol (ROBAXIN) 500 MG tablet Take 1 tablet (500 mg total) by mouth every 8 (eight) hours as needed for muscle spasms. 02/01/21   Regalado, Belkys A, MD  Multiple Vitamin (MULTIVITAMIN WITH MINERALS) TABS tablet Take 1 tablet by mouth daily.    [provider]  pantoprazole (PROTONIX) 40 MG tablet Take 1 tablet (40 mg total) by mouth 2 (two) times daily. 02/01/21   Regalado, Belkys A, MD  sodium chloride (OCEAN) 0.65 % SOLN nasal spray Place 1 spray into both nostrils daily as needed for congestion.    [provider]  SYNTHROID 100 MCG tablet Take 100 mcg by mouth daily. 01/19/21   [provider]    Physical Exam:  Constitutional: Elderly female who appears to be ill but able to follow commands at this time Vitals:   05/10/21 1040 05/10/21 1108 05/10/21 1139 05/10/21 1200  BP: 104/66 (!) 92/42  (!) 116/58  Pulse: (!) 117 63  (!) 52  Resp: (!) 24 (!) 23  (!) 23  Temp:   98.5 F (36.9 C)   TempSrc:   Rectal   SpO2: (!) 75% 100%  90%  Weight:       Eyes: PERRL, lids and conjunctivae normal ENMT: Mucous membranes are dry.  Posterior pharynx clear of any exudate or lesions. Neck: normal, supple, no masses, no thyromegaly Respiratory: clear to auscultation bilaterally, no wheezing, no crackles. Normal respiratory effort. No accessory muscle use.  Cardiovascular: Regular rate and rhythm, no murmurs / rubs / gallops. No extremity edema. 2+ pedal pulses. No carotid bruits.  Abdomen: Tenderness  to palpation of the lower abdomen. Musculoskeletal: no clubbing / cyanosis. No joint deformity upper and lower extremities. Good ROM, no contractures. Normal muscle tone.  Skin: no rashes, lesions, ulcers. No induration Neurologic: CN 2-12 grossly intact. Sensation intact, DTR normal. Strength 5/5 in all 4.  Psychiatric:  Alert and oriented x person and place but not to time.  Normal mood.     Labs on Admission: I have personally reviewed following labs and imaging studies  CBC: Recent Labs  Lab 05/10/21 1020 05/10/21 1031  WBC 8.6  --   NEUTROABS 6.4  --   HGB 15.8* 15.6*  HCT 46.2* 46.0  MCV 93.0  --   PLT 223  --    Basic Metabolic Panel: Recent Labs  Lab 05/10/21 1020 05/10/21 1031  NA 135 128*  K 4.5 4.5  CL 88* 96*  CO2 9*  --   GLUCOSE 93 98  BUN 196* >130*  CREATININE 9.18* 9.70*  CALCIUM 7.6*  --    GFR: Estimated Creatinine Clearance: 5.6 mL/min (A) (by C-G formula based on SCr of 9.7 mg/dL (H)). Liver Function Tests: Recent Labs  Lab 05/10/21 1020  AST 23  ALT 7  ALKPHOS 61  BILITOT 1.5*  PROT 6.5  ALBUMIN 3.1*   No results for input(s): LIPASE, AMYLASE in the last 168 hours. No results for input(s): AMMONIA in the last 168 hours. Coagulation Profile: Recent Labs  Lab 05/10/21 1020  INR 1.0   Cardiac Enzymes: Recent Labs  Lab 05/10/21 1020  CKTOTAL 111   BNP (last 3 results) No results for input(s): PROBNP in the last 8760 hours. HbA1C: No results for input(s): HGBA1C in the last 72 hours. CBG: Recent Labs  Lab 05/10/21 0947 05/10/21 1117 05/10/21 1233  GLUCAP 62* 226* 151*   Lipid Profile: No results for input(s): CHOL, HDL, LDLCALC, TRIG, CHOLHDL, LDLDIRECT in the last 72 hours. Thyroid Function Tests: Recent Labs    05/10/21 0954 05/10/21 1045  TSH 12.326*  --   FREET4  --  0.69   Anemia Panel: No results for input(s): VITAMINB12, FOLATE, FERRITIN, TIBC, IRON, RETICCTPCT in the last 72 hours. Urine analysis:     Component Value Date/Time   COLORURINE AMBER (A) 01/21/2021 1310   APPEARANCEUR CLOUDY (A) 01/21/2021 1310   LABSPEC 1.020 01/21/2021 1310   PHURINE 5.0 01/21/2021 1310   GLUCOSEU NEGATIVE 01/21/2021 1310   HGBUR LARGE (A) 01/21/2021 1310   BILIRUBINUR NEGATIVE 01/21/2021 1310   KETONESUR 5 (A) 01/21/2021 1310   PROTEINUR 100 (A) 01/21/2021 1310   NITRITE NEGATIVE 01/21/2021 1310   LEUKOCYTESUR TRACE (A) 01/21/2021 1310   Sepsis Labs: No results found for this or any previous visit (from the past 240 hour(s)).   Radiological Exams on Admission: DG Chest Port 1 View  Result Date: 05/10/2021 CLINICAL DATA:  Questionable sepsis EXAM: PORTABLE CHEST 1 VIEW COMPARISON:  01/21/2021 FINDINGS: The heart size and mediastinal contours are within normal limits. Both lungs are clear. Nonacute, callused fractures of the posterolateral left ribs. IMPRESSION: 1. No acute abnormality of the lungs in AP portable projection. 2. Nonacute, callused fractures of the posterolateral left ribs. Electronically Signed   By: Lauralyn Primes M.D.   On: 05/10/2021 11:42    EKG: Independently reviewed.  Sinus rhythm at 65 bpm with QTC 438  Assessment/Plan Acute renal failure with Metabolic acidosis with elevated anion: Patient presents with CO2 9, BUN 196, creatinine 9.18, and anion gap 38.  Baseline creatinine previously was 0.66 on 01/2021. CK was 111 for which rhabdomyolysis seems possibly less likely.  Suspecting prerenal cause of symptoms given been BUN to creatinine ratio greater than 20. -Admit to a progressive bed -Strict I&Os -Please Foley catheter -Check urinalysis once able -Check renal ultrasound -Continue lactated Ringer's at 150 mL/h -Sodium bicarb 150 mEq in 5% dextrose at 50 mL/h -Avoid nephrotoxic agents -Appreciate nephrology consultative services, will follow for any additional recommendations  Acute metabolic encephalopathy dysarthria: Patient was found to be acutely altered and confused with  slurred speech.  At baseline patient is reportedly alert and oriented x3.  Question if symptoms secondary to uremia. -Check CT scan of the brain without contrast -Neurochecks -May warrant further work-up if symptoms persist -Speech therapy consult  SIRS: Patient was noted to be tachycardic and tachypneic  on admission with WBC and lactic acid within normal limits.  Question possibility of underlying infection for [place] patient was started on empiric antibiotics of vancomycin, cefepime, and metronidazole.   -Continue cefepime and discontinued continuation of vancomycin due to possible nephrotoxicity -Follow-up cultures  Hypotension: Acute.  Blood pressures noted to trend as low as 97/37 after initial IV fluids had been given. -Hold home blood pressure medications -Bolus 1 L of IV fluids -Goal MAP greater than 65  Diabetes mellitus type 2 with hypoglycemia: Acute.  In route with EMS patient's glucose was noted to be 32 and had been given D10.  In the ED patient's blood sugars dropped again down to 62-patient was given a amp of D50 with improvement and blood sugars.  Her last hemoglobin A1c was 5.5 in 01/2021.  She reportedly is on NovoLog 70/30 possibly 12 units night.  Unclear when she last took this medicine. -Hypoglycemic protocol -Hold home insulin regimen -CBGs every 4 hours -Amp of D50 as needed for low blood sugars  Hyperlipidemia -Continue atorvastatin  Postsurgical hypothyroidism: TSH was 12.326 with free T4 0.69.  Home medications include levothyroxine 100 mcg daily. -Continue levothyroxine -Recheck TSH in 4 to 6 weeks   GI prophylaxis: Protonix DVT prophylaxis: Heparin Code Status: Full Family Communication: Patient's friend updated at bedside Disposition Plan: To be determined Consults called: Nephrology Admission status: Inpatient, require more than 2 midnight stay due to severity of patient's kidney injury and need for close inpatient monitoring of urine output Clydie Braunondell A  Kylie Simmonds MD [facility]   If 7PM-7AM, please contact night-coverage   05/10/2021, 12:40 PM

## 2021-05-10 NOTE — ED Notes (Signed)
Got patient undressed into a gown on the monitor did ekg shown to Dr Adela Lank patient is resting with call bell in reach

## 2021-05-10 NOTE — ED Notes (Signed)
Notified provider about pt BP. 

## 2021-05-10 NOTE — Progress Notes (Signed)
Abdominal x-ray revealed concern for multiple dilated loops of bowel concerning for possible partial small bowel obstruction.  Patient was made n.p.o. and orders placed to recheck abdominal x-ray in a.m.  Urinalysis did not show significant concern for infection.  Continue cefepime and de-escalate when medically appropriate if no other source of infection found.

## 2021-05-11 ENCOUNTER — Encounter (HOSPITAL_COMMUNITY): Payer: Self-pay | Admitting: Internal Medicine

## 2021-05-11 ENCOUNTER — Inpatient Hospital Stay (HOSPITAL_COMMUNITY): Payer: Medicare HMO

## 2021-05-11 ENCOUNTER — Other Ambulatory Visit: Payer: Self-pay

## 2021-05-11 LAB — GLUCOSE, CAPILLARY
Glucose-Capillary: 125 mg/dL — ABNORMAL HIGH (ref 70–99)
Glucose-Capillary: 127 mg/dL — ABNORMAL HIGH (ref 70–99)
Glucose-Capillary: 129 mg/dL — ABNORMAL HIGH (ref 70–99)
Glucose-Capillary: 125 mg/dL — ABNORMAL HIGH (ref 70–99)
Glucose-Capillary: 156 mg/dL — ABNORMAL HIGH (ref 70–99)

## 2021-05-11 LAB — CBC
HCT: 46.3 % — ABNORMAL HIGH (ref 36.0–46.0)
Hemoglobin: 16.9 g/dL — ABNORMAL HIGH (ref 12.0–15.0)
MCH: 32.4 pg (ref 26.0–34.0)
MCHC: 36.5 g/dL — ABNORMAL HIGH (ref 30.0–36.0)
MCV: 88.9 fL (ref 80.0–100.0)
Platelets: 127 10*3/uL — ABNORMAL LOW (ref 150–400)
RBC: 5.21 MIL/uL — ABNORMAL HIGH (ref 3.87–5.11)
RDW: 11.8 % (ref 11.5–15.5)
WBC: 11.3 10*3/uL — ABNORMAL HIGH (ref 4.0–10.5)
nRBC: 0 % (ref 0.0–0.2)

## 2021-05-11 LAB — BASIC METABOLIC PANEL
Anion gap: 20 — ABNORMAL HIGH (ref 5–15)
BUN: 125 mg/dL — ABNORMAL HIGH (ref 8–23)
CO2: 20 mmol/L — ABNORMAL LOW (ref 22–32)
Calcium: 8.3 mg/dL — ABNORMAL LOW (ref 8.9–10.3)
Chloride: 102 mmol/L (ref 98–111)
Creatinine, Ser: 2.6 mg/dL — ABNORMAL HIGH (ref 0.44–1.00)
GFR, Estimated: 20 mL/min — ABNORMAL LOW (ref >60)
Glucose, Bld: 127 mg/dL — ABNORMAL HIGH (ref 70–99)
Potassium: 2.9 mmol/L — ABNORMAL LOW (ref 3.5–5.1)
Sodium: 142 mmol/L (ref 135–145)

## 2021-05-11 LAB — COMPREHENSIVE METABOLIC PANEL
ALT: 14 U/L (ref 0–44)
AST: 27 U/L (ref 15–41)
Albumin: 2.8 g/dL — ABNORMAL LOW (ref 3.5–5.0)
Alkaline Phosphatase: 57 U/L (ref 38–126)
Anion gap: 27 — ABNORMAL HIGH (ref 5–15)
BUN: 158 mg/dL — ABNORMAL HIGH (ref 8–23)
CO2: 12 mmol/L — ABNORMAL LOW (ref 22–32)
Calcium: 8.2 mg/dL — ABNORMAL LOW (ref 8.9–10.3)
Chloride: 101 mmol/L (ref 98–111)
Creatinine, Ser: 4.71 mg/dL — ABNORMAL HIGH (ref 0.44–1.00)
GFR, Estimated: 10 mL/min — ABNORMAL LOW (ref >60)
Glucose, Bld: 109 mg/dL — ABNORMAL HIGH (ref 70–99)
Potassium: 4.4 mmol/L (ref 3.5–5.1)
Sodium: 140 mmol/L (ref 135–145)
Total Bilirubin: 1.3 mg/dL — ABNORMAL HIGH (ref 0.3–1.2)
Total Protein: 6 g/dL — ABNORMAL LOW (ref 6.5–8.1)

## 2021-05-11 LAB — URINE CULTURE: Culture: 10000 — AB

## 2021-05-11 LAB — PHOSPHORUS: Phosphorus: 9.1 mg/dL — ABNORMAL HIGH (ref 2.5–4.6)

## 2021-05-11 LAB — C DIFFICILE QUICK SCREEN W PCR REFLEX
C Diff antigen: NEGATIVE
C Diff interpretation: NOT DETECTED
C Diff toxin: NEGATIVE

## 2021-05-11 MED ORDER — KCL IN DEXTROSE-NACL 40-5-0.45 MEQ/L-%-% IV SOLN
INTRAVENOUS | Status: DC
  Filled 2021-05-11 (×4): qty 1000

## 2021-05-11 MED ORDER — DIATRIZOATE MEGLUMINE & SODIUM 66-10 % PO SOLN
90.0000 mL | Freq: Once | ORAL | Status: AC
  Administered 2021-05-11: 90 mL via ORAL
  Filled 2021-05-11: qty 90

## 2021-05-11 MED ORDER — CHLORHEXIDINE GLUCONATE CLOTH 2 % EX PADS
6.0000 | MEDICATED_PAD | Freq: Every day | CUTANEOUS | Status: DC
  Administered 2021-05-11 – 2021-05-13 (×3): 6 via TOPICAL

## 2021-05-11 MED ORDER — POTASSIUM CHLORIDE 10 MEQ/100ML IV SOLN
10.0000 meq | INTRAVENOUS | Status: AC
  Administered 2021-05-11 (×2): 10 meq via INTRAVENOUS
  Filled 2021-05-11 (×2): qty 100

## 2021-05-11 NOTE — Plan of Care (Signed)

## 2021-05-11 NOTE — Progress Notes (Signed)
[facility] ROUNDING NOTE   Subjective:   Interval History: Kristina Gates is a 65 y.o. female.  Who has a history of diabetes type 2, hypertension.  She was admitted recently to [facility] 01/21/2021 and discharged 02/01/2021.  During hospitalization it was noted that she had recurrent episodes of SVT.  Cardiology was consulted and she was placed on Cardizem.  She was also found to have a UTI and possible sepsis.  She was discharged off antibiotics.  She also had an element of nontraumatic rhabdomyolysis with a CPK level of greater than 20,000.  She was brought back to the emergency room on 05/10/2021 after it was noted that she had failed to contact her family since Friday.  She had no appetite had not been eating and drinking.  When the emergency room came in and found her to have a blood glucose of 20.  She was brought to the emergency room and thought to be severely dehydrated.  She was resuscitated with IV fluids.  Home medications included diltiazem hydralazine losartan.  In the emergency room she was found to be severely hypertensive with a blood pressure 75/48.  Blood pressure 111/48 pulse 65 temperature 97.8 O2 sats 98% room air.  Urine output 2400 cc 05/11/2021  Sodium 140 potassium 4.4 chloride 101 CO2 12 BUN 158 creatinine 4.71 glucose 109 phosphorus 9.1 albumin 2.8 hemoglobin 16.9    Objective:  Vital signs in last 24 hours:  Temp:  [97.6 F (36.4 C)-98.5 F (36.9 C)] 97.8 F (36.6 C) (06/21 1124) Pulse Rate:  [57-76] 65 (06/21 1124) Resp:  [13-20] 17 (06/21 1124) BP: (97-117)/(37-61) 111/48 (06/21 1124) SpO2:  [96 %-100 %] 99 % (06/21 1124)  Weight change:  Filed Weights   05/10/21 0956  Weight: 79.4 kg    Intake/Output: I/O last 3 completed shifts: In: 3000 [IV Piggyback:3000] Out: 1450 [Urine:1450]   Intake/Output this shift:  Total I/O In: 657.1 [I.V.:557.1; IV Piggyback:100] Out: 950 [Urine:950]  CVS- RRR no murmurs rubs or gallops RS-  CTA no wheezes or rales ABD- BS present soft non-distended EXT- no edema   Basic Metabolic Panel: Recent Labs  Lab 05/10/21 1020 05/10/21 1031 05/11/21 0142  NA 135 128* 140  K 4.5 4.5 4.4  CL 88* 96* 101  CO2 9*  --  12*  GLUCOSE 93 98 109*  BUN 196* >130* 158*  CREATININE 9.18* 9.70* 4.71*  CALCIUM 7.6*  --  8.2*  PHOS  --   --  9.1*    Liver Function Tests: Recent Labs  Lab 05/10/21 1020 05/11/21 0142  AST 23 27  ALT 7 14  ALKPHOS 61 57  BILITOT 1.5* 1.3*  PROT 6.5 6.0*  ALBUMIN 3.1* 2.8*   No results for input(s): LIPASE, AMYLASE in the last 168 hours. No results for input(s): AMMONIA in the last 168 hours.  CBC: Recent Labs  Lab 05/10/21 1020 05/10/21 1031 05/11/21 0142  WBC 8.6  --  11.3*  NEUTROABS 6.4  --   --   HGB 15.8* 15.6* 16.9*  HCT 46.2* 46.0 46.3*  MCV 93.0  --  88.9  PLT 223  --  127*    Cardiac Enzymes: Recent Labs  Lab 05/10/21 1020  CKTOTAL 111    BNP: Invalid input(s): POCBNP  CBG: Recent Labs  Lab 05/10/21 1233 05/10/21 2238 05/11/21 0334 05/11/21 0748 05/11/21 1201  GLUCAP 151* 111* 125* 127* 129*    Microbiology: Results for orders placed or performed during the hospital  encounter of 05/10/21  Resp Panel by RT-PCR (Flu A&B, Covid) Nasopharyngeal Swab     Status: None   Collection Time: 05/10/21  9:52 AM   Specimen: Nasopharyngeal Swab; Nasopharyngeal(NP) swabs in vial transport medium  Result Value Ref Range Status   SARS Coronavirus 2 by RT PCR NEGATIVE NEGATIVE Final    Comment: (NOTE) SARS-CoV-2 target nucleic acids are NOT DETECTED.  The SARS-CoV-2 RNA is generally detectable in upper respiratory specimens during the acute phase of infection. The lowest concentration of SARS-CoV-2 viral copies this assay can detect is 138 copies/mL. A negative result does not preclude SARS-Cov-2 infection and should not be used as the sole basis for treatment or other patient management decisions. A negative result may  occur with  improper specimen collection/handling, submission of specimen other than nasopharyngeal swab, presence of viral mutation(s) within the areas targeted by this assay, and inadequate number of viral copies(<138 copies/mL). A negative result must be combined with clinical observations, patient history, and epidemiological information. The expected result is Negative.  Fact Sheet for Patients:  BloggerCourse.com  Fact Sheet for Healthcare Providers:  SeriousBroker.it  This test is no t yet approved or cleared by the [place] FDA and  has been authorized for detection and/or diagnosis of SARS-CoV-2 by FDA under an Emergency Use Authorization (EUA). This EUA will remain  in effect (meaning this test can be used) for the duration of the COVID-19 declaration under Section 564(b)(1) of the Act, 21 U.S.C.section 360bbb-3(b)(1), unless the authorization is terminated  or revoked sooner.       Influenza A by PCR NEGATIVE NEGATIVE Final   Influenza B by PCR NEGATIVE NEGATIVE Final    Comment: (NOTE) The Xpert Xpress SARS-CoV-2/FLU/RSV plus assay is intended as an aid in the diagnosis of influenza from Nasopharyngeal swab specimens and should not be used as a sole basis for treatment. Nasal washings and aspirates are unacceptable for Xpert Xpress SARS-CoV-2/FLU/RSV testing.  Fact Sheet for Patients: BloggerCourse.com  Fact Sheet for Healthcare Providers: SeriousBroker.it  This test is not yet approved or cleared by the [place] FDA and has been authorized for detection and/or diagnosis of SARS-CoV-2 by FDA under an Emergency Use Authorization (EUA). This EUA will remain in effect (meaning this test can be used) for the duration of the COVID-19 declaration under Section 564(b)(1) of the Act, 21 U.S.C. section 360bbb-3(b)(1), unless the authorization is terminated  or revoked.  Performed at [facility], [address], [place], [place] [address]   Blood Culture (routine x 2)     Status: None (Preliminary result)   Collection Time: 05/10/21  9:52 AM   Specimen: BLOOD LEFT ARM  Result Value Ref Range Status   Specimen Description BLOOD LEFT ARM  Final   Special Requests   Final    BOTTLES DRAWN AEROBIC ONLY Blood Culture results may not be optimal due to an inadequate volume of blood received in culture bottles   Culture   Final    NO GROWTH < 24 HOURS Performed at [facility], [address], [place], [place] [address]    Report Status PENDING  Incomplete  Blood Culture (routine x 2)     Status: None (Preliminary result)   Collection Time: 05/10/21  9:57 AM   Specimen: BLOOD  Result Value Ref Range Status   Specimen Description BLOOD LEFT ANTECUBITAL  Final   Special Requests   Final    BOTTLES DRAWN AEROBIC AND ANAEROBIC Blood Culture results may  not be optimal due to an inadequate volume of blood received in culture bottles   Culture   Final    NO GROWTH < 24 HOURS Performed at [facility], [address], [place], [place] [address]    Report Status PENDING  Incomplete  Urine culture     Status: Abnormal   Collection Time: 05/10/21  3:34 PM   Specimen: In/Out Cath Urine  Result Value Ref Range Status   Specimen Description IN/OUT CATH URINE  Final   Special Requests   Final    NONE Performed at [facility], [address] [place], [place] [address]    Culture (A)  Final    10,000 COLONIES/mL MULTIPLE SPECIES PRESENT, SUGGEST RECOLLECTION   Report Status 05/11/2021 FINAL  Final  C Difficile Quick Screen w PCR reflex     Status: None   Collection Time: 05/10/21  9:00 PM   Specimen: STOOL  Result Value Ref Range Status   C Diff antigen NEGATIVE NEGATIVE Final   C Diff toxin NEGATIVE NEGATIVE Final   C Diff interpretation No C. difficile detected.  Final    Comment: Performed at [facility],  [address] [place], [place] [address]    Coagulation Studies: Recent Labs    05/10/21 1020  LABPROT 13.6  INR 1.0    Urinalysis: Recent Labs    05/10/21 1600  COLORURINE YELLOW  LABSPEC 1.014  PHURINE 5.0  GLUCOSEU 50*  HGBUR SMALL*  BILIRUBINUR NEGATIVE  KETONESUR NEGATIVE  PROTEINUR NEGATIVE  NITRITE NEGATIVE  LEUKOCYTESUR TRACE*      Imaging: DG Abd 1 View  Result Date: 05/10/2021 CLINICAL DATA:  Abdominal pain.  Hypotension. ED notes: patient has a medical history significant of hypertension, hyperlipidemia, hypothyroidism, and diabetes mellitus type 2 presents after being found on her laying on the couch by her friend and former coworker this morning confused and with slurred speech. History is mostly obtained from the patient's friend who found her this morning. Apparently family had last spoken to the patient 3 days ago. At that time she complained of feeling under the weather and did not feel like eating or drinking. EXAM: ABDOMEN - 1 VIEW COMPARISON:  None. FINDINGS: Dilated loops of small bowel are noted in the central abdomen. Scattered air seen within a nondistended colon. Aortoiliac and femoral artery atherosclerotic calcifications. Soft tissues otherwise unremarkable. IMPRESSION: 1. Dilated small bowel loops consistent with partial small bowel obstruction versus a small bowel adynamic ileus. Electronically Signed   By: Amie Portland M.D.   On: 05/10/2021 17:44   CT HEAD WO CONTRAST  Result Date: 05/10/2021 CLINICAL DATA:  65 year old female with weakness and confusion for 5 days. EXAM: CT HEAD WITHOUT CONTRAST TECHNIQUE: Contiguous axial images were obtained from the base of the skull through the vertex without intravenous contrast. COMPARISON:  01/21/2021 CT FINDINGS: Brain: No evidence of acute infarction, hemorrhage, hydrocephalus, extra-axial collection or mass lesion/mass effect. Vascular: Carotid atherosclerotic calcifications are noted. Skull: Normal. Negative  for fracture or focal lesion. Sinuses/Orbits: No acute finding. Other: None. IMPRESSION: No evidence of acute intracranial abnormality. Electronically Signed   By: Harmon Pier M.D.   On: 05/10/2021 15:28   US RENAL  Result Date: 05/10/2021 CLINICAL DATA:  Acute renal failure. EXAM: RENAL / URINARY TRACT ULTRASOUND COMPLETE COMPARISON:  None. FINDINGS: Right Kidney: Renal measurements: 11.5 x 4.4 x 5.8 cm = volume: 150.9 mL. Normal parenchymal echogenicity. Upper pole cyst measuring 2.1 x 2.1 x  2.5 cm. No other masses, no stones and no hydronephrosis. Left Kidney: Renal measurements: 12.5 x 6.9 x 5.6 cm = volume: 254 mL. Normal parenchymal echogenicity. Assessment somewhat limited due to a poor acoustic window. No convincing mass or stone. No hydronephrosis. Bladder: Bladder distended.  No wall thickening, mass or stone. Other: None. IMPRESSION: 1. No acute findings.  No hydronephrosis. 2. 2.5 cm cyst arises from the upper pole the right kidney. No other renal abnormalities. 3. Distended bladder. Electronically Signed   By: Amie Portland M.D.   On: 05/10/2021 17:33   DG Chest Port 1 View  Result Date: 05/10/2021 CLINICAL DATA:  Questionable sepsis EXAM: PORTABLE CHEST 1 VIEW COMPARISON:  01/21/2021 FINDINGS: The heart size and mediastinal contours are within normal limits. Both lungs are clear. Nonacute, callused fractures of the posterolateral left ribs. IMPRESSION: 1. No acute abnormality of the lungs in AP portable projection. 2. Nonacute, callused fractures of the posterolateral left ribs. Electronically Signed   By: Lauralyn Primes M.D.   On: 05/10/2021 11:42   DG Abd Portable 1V  Result Date: 05/11/2021 CLINICAL DATA:  Partial small bowel obstruction EXAM: PORTABLE ABDOMEN - 1 VIEW COMPARISON:  Portable exam 0736 hours compared to 05/10/2021 FINDINGS: Air-filled dilated small bowel loops in the mid abdomen again seen. Paucity of colonic gas. No bowel wall thickening. Osseous structures stable. No  urinary tract calcification. IMPRESSION: Persistent small bowel distension. Electronically Signed   By: Ulyses Southward M.D.   On: 05/11/2021 10:22     Medications:    ceFEPime (MAXIPIME) IV Stopped (05/11/21 1028)   sodium bicarbonate 150 mEq in D5W infusion 50 mL/hr at 05/11/21 1000    atorvastatin  40 mg Oral Q supper   Chlorhexidine Gluconate Cloth  6 each Topical Daily   diatrizoate meglumine-sodium  90 mL Oral Once   heparin  5,000 Units Subcutaneous Q8H   levothyroxine  100 mcg Oral QAC breakfast   pantoprazole  40 mg Oral Daily   sodium chloride flush  3 mL Intravenous Q12H   acetaminophen **OR** acetaminophen, albuterol, dextrose, ondansetron (ZOFRAN) IV  Assessment/ Plan:  Acute kidney injury.  This seems to be in the setting of hypertension volume depletion and concurrent use of ARB.  It is unclear whether she has been taking her angiotensin receptor blocker.  No hydronephrosis.  It does appear that the last serum creatinine was in the normal range in March 2022.  Urine bland.  Continue to avoid nephrotoxins renal panel daily.  I have a concern about using vancomycin.  I would limit this to 1 dose.  Place Foley catheter monitor urine output.  There does not appear to be any evidence of rhabdomyolysis.  Creatinine appears to be improving 2. Hypertension/volume  -appears to be volume depleted.  Would recommend replacement with IV fluids.  She has metabolic acidosis therefore sodium bicarbonate would be indicated.  Continue sodium bicarbonate 3.  Metabolic acidosis.  We will start IV bicarbonate patient in positive fluid balance 4.  Hypotension.  Resolved.  Continue to hold antihypertensive medication. 5.  Altered mental status probably secondary to acute kidney injury hypotension and sepsis 6.  Hyponatremia resolved    LOS: 1 Garnetta Buddy @TODAY @12 :38 PM

## 2021-05-11 NOTE — Progress Notes (Signed)
PROGRESS NOTE  Kristina Gates  WUJ:[id number] DOB: 05-Dec-1955 DOA: 05/10/2021 PCP: Dorisann Frames, MD   Brief Narrative: Kristina Gates is a 65 y.o. female with a history of T2DM, HTN, HLD, hypothyroidism who was found at home alone confused on the cough in urine and feces. EMS arrived, found CBG to be 32, improved to 109 with dextrose. She remained confused in the ED, complaining only of abdominal pain. She was afebrile with acute renal failure (SCr 9.18, BUN 186), metabolic acidosis (bicarb 9), and hyponatremia (Na 128) among other abnormalities including albumin 3.1, total bilirubin 1.5, CK 111, TSH 12.326, and lactic acid 1.1. IV fluids, empiric antibiotics were given with nephrology consulting, started on bicarbonate infusion.   Assessment & Plan: Principal Problem:   Acute renal failure (ARF) (HCC) Active Problems:   Postoperative hypothyroidism   Pure hypercholesterolemia   Type 2 diabetes mellitus with hypoglycemia without coma (HCC)   Metabolic acidosis  Acute renal failure with metabolic acidosis: Combination of prerenal (dehydration), postrenal with distended bladder, and possibly hypotension and ARB-related. UA is bland without RBCs, WBCs or mention of casts. U/S with distended bladder but no hydronephrosis.  - Improving with foley and fluids, both of which will continue. Remains acidotic, so plan to continue bicarb gtt. - Nephrology consultation appreciate.  - Avoid hypotension.  - Minimize nephrotoxins.   Acute metabolic encephalopathy: Multifactorial, due to renal failure, severe (and possibly sustained) hypoglycemia. Remains confused. CT head nonacute.  - Continue delirium precautions and optimizing metabolic profile.  - SLP once able to take po.   Partial SBO: Pt complained of abd pain, though this is improved. AXR showing distended SB loops and is stable on recheck this AM, possible adynamic ileus. Exam is very benign. No N/V, so will not require NG at this time.  -  Administer gastrografin, delay repeat XR x8 hours.   Hyponatremia: Corrected briskly.  - Defer to nephrology regarding tonicity of continued IVF.   Hypotension on essential HTN: Possibly due to dehydration as this has improved with IV fluids.  - Continue IVF and monitoring in PCU - Hold home ARB, diltiazem, hydralazine  SIRS: Leukocytosis to 11.3 on 6/21 without nidus of infection currently evident.  - Follow culture data, hopeful to DC abx if negative x48 hours.   T2DM with hypoglycemia: HbA1c in March 2022 was 5.5%.  - Continue D5-containing IVF.  - CBG q4h. Will hold insulins now and probably at discharge.  Hypothyroidism: TSH 12.326 with normal free T4.  - Continue synthroid at dose, recheck TFTs in 4-6 weeks.   HLD:  - Continue statin  Thrombocytopenia:  - Trend.  +FOBT: Unclear significance without gross bleeding and no anemia.  Obesity: Estimated body mass index is 31 kg/m as calculated from the following:   Height as of 02/15/21:  (1.6 m).   Weight as of this encounter: 79.4 kg.  DVT prophylaxis: Heparin Code Status: Full Family Communication: None at bedside Disposition Plan:  Status is: Inpatient  Remains inpatient appropriate because:Altered mental status, Ongoing diagnostic testing needed not appropriate for outpatient work up, and Inpatient level of care appropriate due to severity of illness  Dispo: The patient is from: Home              Anticipated d/c is to:  TBD              Patient currently is not medically stable to d/c.   Difficult to place patient No  Consultants:  Nephrology  Procedures:  None  Antimicrobials: Vancomycin, cefepime, flagyl   Subjective: Pt knows she's in [facility] but not the day or year or why she's here. Can't think of anyone's name for me to call. She denies abdominal pain to me, doesn't mention feeling hungry, denies nausea or vomiting.   Objective: Vitals:   05/10/21 2159 05/11/21 0045 05/11/21  0331 05/11/21 0749  BP: (!) 109/47 117/61 (!) 112/50 (!) 113/46  Pulse: (!) 57 72 75 67  Resp: 13  18 20   Temp: 98.1 F (36.7 C)  98.5 F (36.9 C) 97.6 F (36.4 C)  TempSrc: Oral Oral Oral Oral  SpO2: 99%   96%  Weight:        Intake/Output Summary (Last 24 hours) at 05/11/2021 0903 Last data filed at 05/11/2021 05/13/2021 Gross per 24 hour  Intake 3000 ml  Output 1450 ml  Net 1550 ml   Filed Weights   05/10/21 0956  Weight: 79.4 kg    Gen: 65 y.o. female in no distress Pulm: Non-labored breathing room air. Clear to auscultation bilaterally.  CV: Regular rate and rhythm. No murmur, rub, or gallop. No JVD, no pitting pedal edema. GI: Abdomen soft, not at all tender and nondistended. Normoactive bowel sounds. No organomegaly or masses felt. Ext: Warm, no deformities Skin: No rashes, lesions or ulcers Neuro: Alert and incompletely oriented. MAEW, no slurred speech at this time.  Psych: Judgement and insight appear Impaired. Calm.  Data Reviewed: I have personally reviewed following labs and imaging studies  CBC: Recent Labs  Lab 05/10/21 1020 05/10/21 1031 05/11/21 0142  WBC 8.6  --  11.3*  NEUTROABS 6.4  --   --   HGB 15.8* 15.6* 16.9*  HCT 46.2* 46.0 46.3*  MCV 93.0  --  88.9  PLT 223  --  127*   Basic Metabolic Panel: Recent Labs  Lab 05/10/21 1020 05/10/21 1031 05/11/21 0142  NA 135 128* 140  K 4.5 4.5 4.4  CL 88* 96* 101  CO2 9*  --  12*  GLUCOSE 93 98 109*  BUN 196* >130* 158*  CREATININE 9.18* 9.70* 4.71*  CALCIUM 7.6*  --  8.2*  PHOS  --   --  9.1*   GFR: Estimated Creatinine Clearance: 11.6 mL/min (A) (by C-G formula based on SCr of 4.71 mg/dL (H)). Liver Function Tests: Recent Labs  Lab 05/10/21 1020 05/11/21 0142  AST 23 27  ALT 7 14  ALKPHOS 61 57  BILITOT 1.5* 1.3*  PROT 6.5 6.0*  ALBUMIN 3.1* 2.8*   No results for input(s): LIPASE, AMYLASE in the last 168 hours. No results for input(s): AMMONIA in the last 168 hours. Coagulation  Profile: Recent Labs  Lab 05/10/21 1020  INR 1.0   Cardiac Enzymes: Recent Labs  Lab 05/10/21 1020  CKTOTAL 111   BNP (last 3 results) No results for input(s): PROBNP in the last 8760 hours. HbA1C: No results for input(s): HGBA1C in the last 72 hours. CBG: Recent Labs  Lab 05/10/21 1117 05/10/21 1233 05/10/21 2238 05/11/21 0334 05/11/21 0748  GLUCAP 226* 151* 111* 125* 127*   Lipid Profile: No results for input(s): CHOL, HDL, LDLCALC, TRIG, CHOLHDL, LDLDIRECT in the last 72 hours. Thyroid Function Tests: Recent Labs    05/10/21 0954 05/10/21 1045  TSH 12.326*  --   FREET4  --  0.69   Anemia Panel: No results for input(s): VITAMINB12, FOLATE, FERRITIN, TIBC, IRON, RETICCTPCT in the last 72 hours. Urine analysis:    Component Value Date/Time  COLORURINE YELLOW 05/10/2021 1600   APPEARANCEUR HAZY (A) 05/10/2021 1600   LABSPEC 1.014 05/10/2021 1600   PHURINE 5.0 05/10/2021 1600   GLUCOSEU 50 (A) 05/10/2021 1600   HGBUR SMALL (A) 05/10/2021 1600   BILIRUBINUR NEGATIVE 05/10/2021 1600   KETONESUR NEGATIVE 05/10/2021 1600   PROTEINUR NEGATIVE 05/10/2021 1600   NITRITE NEGATIVE 05/10/2021 1600   LEUKOCYTESUR TRACE (A) 05/10/2021 1600   Recent Results (from the past 240 hour(s))  Resp Panel by RT-PCR (Flu A&B, Covid) Nasopharyngeal Swab     Status: None   Collection Time: 05/10/21  9:52 AM   Specimen: Nasopharyngeal Swab; Nasopharyngeal(NP) swabs in vial transport medium  Result Value Ref Range Status   SARS Coronavirus 2 by RT PCR NEGATIVE NEGATIVE Final    Comment: (NOTE) SARS-CoV-2 target nucleic acids are NOT DETECTED.  The SARS-CoV-2 RNA is generally detectable in upper respiratory specimens during the acute phase of infection. The lowest concentration of SARS-CoV-2 viral copies this assay can detect is 138 copies/mL. A negative result does not preclude SARS-Cov-2 infection and should not be used as the sole basis for treatment or other patient  management decisions. A negative result may occur with  improper specimen collection/handling, submission of specimen other than nasopharyngeal swab, presence of viral mutation(s) within the areas targeted by this assay, and inadequate number of viral copies(<138 copies/mL). A negative result must be combined with clinical observations, patient history, and epidemiological information. The expected result is Negative.  Fact Sheet for Patients:  BloggerCourse.com  Fact Sheet for Healthcare Providers:  SeriousBroker.it  This test is no t yet approved or cleared by the [place] FDA and  has been authorized for detection and/or diagnosis of SARS-CoV-2 by FDA under an Emergency Use Authorization (EUA). This EUA will remain  in effect (meaning this test can be used) for the duration of the COVID-19 declaration under Section 564(b)(1) of the Act, 21 U.S.C.section 360bbb-3(b)(1), unless the authorization is terminated  or revoked sooner.       Influenza A by PCR NEGATIVE NEGATIVE Final   Influenza B by PCR NEGATIVE NEGATIVE Final    Comment: (NOTE) The Xpert Xpress SARS-CoV-2/FLU/RSV plus assay is intended as an aid in the diagnosis of influenza from Nasopharyngeal swab specimens and should not be used as a sole basis for treatment. Nasal washings and aspirates are unacceptable for Xpert Xpress SARS-CoV-2/FLU/RSV testing.  Fact Sheet for Patients: BloggerCourse.com  Fact Sheet for Healthcare Providers: SeriousBroker.it  This test is not yet approved or cleared by the [place] FDA and has been authorized for detection and/or diagnosis of SARS-CoV-2 by FDA under an Emergency Use Authorization (EUA). This EUA will remain in effect (meaning this test can be used) for the duration of the COVID-19 declaration under Section 564(b)(1) of the Act, 21 U.S.C. section 360bbb-3(b)(1),  unless the authorization is terminated or revoked.  Performed at [facility], [address], [place], [place] [address]   Blood Culture (routine x 2)     Status: None (Preliminary result)   Collection Time: 05/10/21  9:52 AM   Specimen: BLOOD LEFT ARM  Result Value Ref Range Status   Specimen Description BLOOD LEFT ARM  Final   Special Requests   Final    BOTTLES DRAWN AEROBIC ONLY Blood Culture results may not be optimal due to an inadequate volume of blood received in culture bottles   Culture   Final    NO GROWTH < 24 HOURS Performed at [facility], [address] [place], [place] [address]    Report Status PENDING  Incomplete  Blood Culture (routine x 2)     Status: None (Preliminary result)   Collection Time: 05/10/21  9:57 AM   Specimen: BLOOD  Result Value Ref Range Status   Specimen Description BLOOD LEFT ANTECUBITAL  Final   Special Requests   Final    BOTTLES DRAWN AEROBIC AND ANAEROBIC Blood Culture results may not be optimal due to an inadequate volume of blood received in culture bottles   Culture   Final    NO GROWTH < 24 HOURS Performed at [facility], [address] [place], [place] [address]    Report Status PENDING  Incomplete  C Difficile Quick Screen w PCR reflex     Status: None   Collection Time: 05/10/21  9:00 PM   Specimen: STOOL  Result Value Ref Range Status   C Diff antigen NEGATIVE NEGATIVE Final   C Diff toxin NEGATIVE NEGATIVE Final   C Diff interpretation No C. difficile detected.  Final    Comment: Performed at [facility], [address], [place], [place] [address]      Radiology Studies: DG Abd 1 View  Result Date: 05/10/2021 CLINICAL DATA:  Abdominal pain.  Hypotension. ED notes: patient has a medical history significant of hypertension, hyperlipidemia, hypothyroidism, and diabetes mellitus type 2 presents after being found on her laying on the couch by her friend and former coworker this morning confused and  with slurred speech. History is mostly obtained from the patient's friend who found her this morning. Apparently family had last spoken to the patient 3 days ago. At that time she complained of feeling under the weather and did not feel like eating or drinking. EXAM: ABDOMEN - 1 VIEW COMPARISON:  None. FINDINGS: Dilated loops of small bowel are noted in the central abdomen. Scattered air seen within a nondistended colon. Aortoiliac and femoral artery atherosclerotic calcifications. Soft tissues otherwise unremarkable. IMPRESSION: 1. Dilated small bowel loops consistent with partial small bowel obstruction versus a small bowel adynamic ileus. Electronically Signed   By: Amie Portland M.D.   On: 05/10/2021 17:44   CT HEAD WO CONTRAST  Result Date: 05/10/2021 CLINICAL DATA:  65 year old female with weakness and confusion for 5 days. EXAM: CT HEAD WITHOUT CONTRAST TECHNIQUE: Contiguous axial images were obtained from the base of the skull through the vertex without intravenous contrast. COMPARISON:  01/21/2021 CT FINDINGS: Brain: No evidence of acute infarction, hemorrhage, hydrocephalus, extra-axial collection or mass lesion/mass effect. Vascular: Carotid atherosclerotic calcifications are noted. Skull: Normal. Negative for fracture or focal lesion. Sinuses/Orbits: No acute finding. Other: None. IMPRESSION: No evidence of acute intracranial abnormality. Electronically Signed   By: Harmon Pier M.D.   On: 05/10/2021 15:28   US RENAL  Result Date: 05/10/2021 CLINICAL DATA:  Acute renal failure. EXAM: RENAL / URINARY TRACT ULTRASOUND COMPLETE COMPARISON:  None. FINDINGS: Right Kidney: Renal measurements: 11.5 x 4.4 x 5.8 cm = volume: 150.9 mL. Normal parenchymal echogenicity. Upper pole cyst measuring 2.1 x 2.1 x 2.5 cm. No other masses, no stones and no hydronephrosis. Left Kidney: Renal measurements: 12.5 x 6.9 x 5.6 cm = volume: 254 mL. Normal parenchymal echogenicity. Assessment somewhat limited due to a poor  acoustic window. No convincing mass or stone. No hydronephrosis. Bladder: Bladder distended.  No wall thickening, mass or stone. Other: None. IMPRESSION: 1. No acute findings.  No hydronephrosis. 2. 2.5 cm cyst arises from the upper  pole the right kidney. No other renal abnormalities. 3. Distended bladder. Electronically Signed   By: Amie Portlandavid  Ormond M.D.   On: 05/10/2021 17:33   DG Chest Port 1 View  Result Date: 05/10/2021 CLINICAL DATA:  Questionable sepsis EXAM: PORTABLE CHEST 1 VIEW COMPARISON:  01/21/2021 FINDINGS: The heart size and mediastinal contours are within normal limits. Both lungs are clear. Nonacute, callused fractures of the posterolateral left ribs. IMPRESSION: 1. No acute abnormality of the lungs in AP portable projection. 2. Nonacute, callused fractures of the posterolateral left ribs. Electronically Signed   By: Lauralyn PrimesAlex  Bibbey M.D.   On: 05/10/2021 11:42    Scheduled Meds:  atorvastatin  40 mg Oral Q supper   Chlorhexidine Gluconate Cloth  6 each Topical Daily   heparin  5,000 Units Subcutaneous Q8H   levothyroxine  100 mcg Oral QAC breakfast   pantoprazole  40 mg Oral Daily   sodium chloride flush  3 mL Intravenous Q12H   Continuous Infusions:  ceFEPime (MAXIPIME) IV     sodium bicarbonate 150 mEq in D5W infusion 50 mL/hr at 05/10/21 1602     LOS: 1 day   Time spent: 35 minutes.  Tyrone Nineyan B Adriaan Maltese, MD [facility] [email or web address] 05/11/2021, 9:03 AM

## 2021-05-11 NOTE — Progress Notes (Signed)
SLP Cancellation Note  Patient Details Name: Kristina Gates MRN: [id number] DOB: 1956/09/22   Cancelled treatment:       Reason Eval/Treat Not Completed: Medical issues which prohibited therapy. Pt is NPO for potential small bowel obstruction   Kristina Gates, Riley Nearing 05/11/2021, 8:45 AM

## 2021-05-11 NOTE — Progress Notes (Signed)
Initial Nutrition Assessment  DOCUMENTATION CODES:   Obesity unspecified  INTERVENTION:   -RD will follow for diet advancement and add supplement as appropriate  NUTRITION DIAGNOSIS:   Inadequate oral intake related to altered GI function as evidenced by NPO status.  GOAL:   Patient will meet greater than or equal to 90% of their needs  MONITOR:   Diet advancement, Labs, Weight trends, Skin, I & O's  REASON FOR ASSESSMENT:   Malnutrition Screening Tool    ASSESSMENT:   Kristina Gates is a 65 y.o. female with medical history significant of hypertension, hyperlipidemia, hypothyroidism, and diabetes mellitus type 2 presents after being found on her laying on the couch by her friend and former coworker this morning confused and with slurred speech.  History is mostly obtained from the patient's friend who found her this morning.  Apparently family had last spoken to the patient 3 days ago.  At that time she complained of feeling under the weather and did not feel like eating or drinking.  Family was not able to reach her over the weekend, but were able to get in contact with her friend to go check on her this morning.  She found her laying on the couch in urine and feces.  Patient was very confused and her speech was slurred.  Normally patient is alert and oriented x3.  She complains of pain in her abdomen, but denied having any falls.  Her friend noticed that the last date that she turned over her calendar was 4 days ago, and she can only marked off some of her meds 3 days ago.  Pt admitted with acute renal failure with metabolic acidosis and acute metabolic encephalopathy.   6/20- s/p KUB- revealed concern for multiple dilated loops of bowel concerning for possible partial small bowel obstruction  Reviewed I/O's: +1.6 L x 24 hours  UOP: 1.5 L x 24 hours  Case discussed with RN, pt remains NPO and awaiting further KUB.   Spoke with pt at bedside, who complains that she is  hungry. Pt a poor historian, answers most questions with "I don't know" or "I don't remember". She denies any nausea, vomiting, or abdominal pain. PTA she reports good appetite, however, unable to provide accurate diet recall. Per pt, she has been eating a lot of fruit.   Pt endorses wt loss. She reports UBW is around 175#. She is unsure when wt loss started. Per wt hx, pt has experienced a 2.3% wt loss over the past 3 months, which is not significant for time frame.  Discussed rationale for NPO status and potential for diet advancement.  Medications reviewed and include sodium bicarbonate 150 mEq in dextrose 5% infusion @ 50 ml/hr.   Lab Results  Component Value Date   HGBA1C 5.5 01/21/2021   PTA DM medications are none.   Labs reviewed: Phos: 9.1, CBGS: 111-127 (inpatient orders for glycemic control are none).    NUTRITION - FOCUSED PHYSICAL EXAM:  Flowsheet Row Most Recent Value  Orbital Region No depletion  Upper Arm Region No depletion  Thoracic and Lumbar Region No depletion  Buccal Region No depletion  Temple Region No depletion  Clavicle Bone Region No depletion  Clavicle and Acromion Bone Region No depletion  Scapular Bone Region No depletion  Dorsal Hand No depletion  Patellar Region No depletion  Anterior Thigh Region No depletion  Posterior Calf Region No depletion  Edema (RD Assessment) None  Hair Reviewed  Eyes Reviewed  Mouth Reviewed  Skin Reviewed  Nails Reviewed       Diet Order:   Diet Order             Diet NPO time specified  Diet effective now                   EDUCATION NEEDS:   Education needs have been addressed  Skin:  Skin Assessment: Reviewed RN Assessment  Last BM:  05/10/21  Height:   Ht Readings from Last 1 Encounters:  02/15/21 5\' 3"  (1.6 m)    Weight:   Wt Readings from Last 1 Encounters:  05/10/21 79.4 kg    Ideal Body Weight:  52.3 kg  BMI:  Body mass index is 31 kg/m.  Estimated Nutritional Needs:    Kcal:  1600-1800  Protein:  80-95 grams  Fluid:  > 1.6 L    05/12/21, RD, LDN, CDCES Registered Dietitian II Certified Diabetes Care and Education Specialist Please refer to [facility] for RD and/or RD on-call/weekend/after hours pager

## 2021-05-12 DIAGNOSIS — N179 Acute kidney failure, unspecified: Principal | ICD-10-CM

## 2021-05-12 LAB — RENAL FUNCTION PANEL
Albumin: 2.8 g/dL — ABNORMAL LOW (ref 3.5–5.0)
Anion gap: 13 (ref 5–15)
BUN: 96 mg/dL — ABNORMAL HIGH (ref 8–23)
CO2: 22 mmol/L (ref 22–32)
Calcium: 8.2 mg/dL — ABNORMAL LOW (ref 8.9–10.3)
Chloride: 107 mmol/L (ref 98–111)
Creatinine, Ser: 1.7 mg/dL — ABNORMAL HIGH (ref 0.44–1.00)
GFR, Estimated: 33 mL/min — ABNORMAL LOW (ref >60)
Glucose, Bld: 190 mg/dL — ABNORMAL HIGH (ref 70–99)
Phosphorus: 3.1 mg/dL (ref 2.5–4.6)
Potassium: 2.9 mmol/L — ABNORMAL LOW (ref 3.5–5.1)
Sodium: 142 mmol/L (ref 135–145)

## 2021-05-12 LAB — GLUCOSE, CAPILLARY
Glucose-Capillary: 135 mg/dL — ABNORMAL HIGH (ref 70–99)
Glucose-Capillary: 157 mg/dL — ABNORMAL HIGH (ref 70–99)
Glucose-Capillary: 171 mg/dL — ABNORMAL HIGH (ref 70–99)
Glucose-Capillary: 175 mg/dL — ABNORMAL HIGH (ref 70–99)
Glucose-Capillary: 180 mg/dL — ABNORMAL HIGH (ref 70–99)
Glucose-Capillary: 200 mg/dL — ABNORMAL HIGH (ref 70–99)
Glucose-Capillary: 207 mg/dL — ABNORMAL HIGH (ref 70–99)

## 2021-05-12 LAB — HEMOGLOBIN A1C
Hgb A1c MFr Bld: 5.4 % (ref 4.8–5.6)
Mean Plasma Glucose: 108 mg/dL

## 2021-05-12 MED ORDER — SODIUM CHLORIDE 0.9 % IV SOLN
2.0000 g | Freq: Two times a day (BID) | INTRAVENOUS | Status: DC
  Administered 2021-05-12: 2 g via INTRAVENOUS
  Filled 2021-05-12 (×2): qty 2

## 2021-05-12 MED ORDER — HALOPERIDOL LACTATE 5 MG/ML IJ SOLN
2.5000 mg | Freq: Once | INTRAMUSCULAR | Status: AC
  Administered 2021-05-12: 2.5 mg via INTRAVENOUS
  Filled 2021-05-12: qty 1

## 2021-05-12 MED ORDER — POTASSIUM CHLORIDE 10 MEQ/100ML IV SOLN: 10.0000 meq | INTRAVENOUS | Status: DC

## 2021-05-12 MED ORDER — POTASSIUM CHLORIDE 10 MEQ/100ML IV SOLN
10.0000 meq | INTRAVENOUS | Status: AC
  Administered 2021-05-12 (×4): 10 meq via INTRAVENOUS
  Filled 2021-05-12 (×3): qty 100

## 2021-05-12 NOTE — Progress Notes (Signed)
Pharmacy Antibiotic Note  Kristina Gates is a 65 y.o. female admitted on 05/10/2021 with sepsis.  Pharmacy has been consulted for Cefepime dosing.   ID: Afebrile. WBC 8.6>11.3 up.LA WNL, Scr down to 1.7  Vanco 6/20 x 1 Cefepime 6/20>>  6/20: BC x 2>>NGTD 6/20: UCx:  10,000 mult species 6/20: Cdiff neg 6/20: COVID/Flu:  neg  Plan: Increase Cefepime to 2g IV q12hrs.    Weight: 79.4 kg (175 lb)  Temp (24hrs), Avg:98.3 F (36.8 C), Min:97.6 F (36.4 C), Max:99 F (37.2 C)  Recent Labs  Lab 05/10/21 1020 05/10/21 1031 05/10/21 1152 05/11/21 0142 05/11/21 1508 05/12/21 0119  WBC 8.6  --   --  11.3*  --   --   CREATININE 9.18* 9.70*  --  4.71* 2.60* 1.70*  LATICACIDVEN 1.1  --  1.2  --   --   --     Estimated Creatinine Clearance: 32 mL/min (A) (by C-G formula based on SCr of 1.7 mg/dL (H)).    Allergies  Allergen Reactions   Metformin And Related Cough   Zestril [Lisinopril] Cough   Kristina Gates S. Kristina Gates, PharmD, BCPS Clinical Staff Pharmacist [email or web address]  Kristina Gates 05/12/2021 9:28 AM

## 2021-05-12 NOTE — Evaluation (Signed)
Physical Therapy Evaluation Patient Details Name: Kristina Gates MRN: [id number] DOB: 03-19-1956 Today's Date: 05/12/2021   History of Present Illness  Pt is 65 y.o. female presenting to ED on 6/20 with AMS and slurred speech after being found down on the couch for unknown duration of time by her friend. Patient admitted with SIRS, acute renal failure with metabolic acidosis and acute metabolic encephalopathy. Recent hosptial admission 3/20222 with UTI, urosepsis, AKI, rhabdo adn multiple L rib fx after being found down by her daughter. PMHx significant for HTN, HLD, thyroid disease and DMII.  Clinical Impression   Pt admitted with above diagnosis. Prior to recent admissions pt was independent at home.  Today, she was able to ambulate 60'x2 but with significant confusion and poor safety awareness.  Pt's LE strength is good but does present with decreased balance and safety. Will benefit from acute PT.  Unsafe to return home alone, so recommending SNF at this time.  Pt currently with functional limitations due to the deficits listed below (see PT Problem List). Pt will benefit from skilled PT to increase their independence and safety with mobility to allow discharge to the venue listed below.       Follow Up Recommendations SNF;Supervision/Assistance - 24 hour    Equipment Recommendations  None recommended by PT    Recommendations for Other Services       Precautions / Restrictions Precautions Precautions: Fall Precaution Comments: High fall risk Restrictions Weight Bearing Restrictions: No      Mobility  Bed Mobility Overal bed mobility: Needs Assistance             General bed mobility comments: in chair at arrival    Transfers Overall transfer level: Needs assistance Equipment used: None Transfers: Sit to/from Stand Sit to Stand: Min guard Stand pivot transfers: Supervision       General transfer comment: min guard for safety; performed 3 x during  session  Ambulation/Gait Ambulation/Gait assistance: Min assist Gait Distance (Feet): 60 Feet (60'x2) Assistive device: Rolling walker (2 wheeled) Gait Pattern/deviations: Step-through pattern;Decreased stride length;Drifts right/left Gait velocity: decreased   General Gait Details: Pt requiring min A for balance and RW management; multiple cues for safety and avoiding objects with RW;  Stairs            Wheelchair Mobility    Modified Rankin (Stroke Patients Only)       Balance Overall balance assessment: Needs assistance Sitting-balance support: No upper extremity supported Sitting balance-Leahy Scale: Good     Standing balance support: No upper extremity supported;Bilateral upper extremity supported Standing balance-Leahy Scale: Fair Standing balance comment: Could take a few steps without RW but utilized RW for hallway ambulation                             Pertinent Vitals/Pain Pain Assessment: No/denies pain    Home Living Family/patient expects to be discharged to:: Private residence Living Arrangements: Alone Available Help at Discharge: Family;Friend(s);Available PRN/intermittently Type of Home: House Home Access: Stairs to enter   Entrance Stairs-Number of Steps: threshold Home Layout: Two level;Able to live on main level with bedroom/bathroom   Additional Comments: patient perfers to take baths, but will complete standing showers as well.    Prior Function Level of Independence: Independent         Comments: Patient independent with ADL, home mangement, meals, medication managment, bill payment. No AD for mobility in home/community dwellings.  Hand Dominance   Dominant Hand: Right    Extremity/Trunk Assessment   Upper Extremity Assessment Upper Extremity Assessment: Defer to OT evaluation    Lower Extremity Assessment Lower Extremity Assessment: LLE deficits/detail;RLE deficits/detail RLE Deficits / Details: ROM WFL;  MMT 5/5 RLE Sensation: WNL RLE Coordination: WNL LLE Deficits / Details: ROM WFL; MMT 5/5 LLE Sensation: WNL LLE Coordination: WNL    Cervical / Trunk Assessment Cervical / Trunk Assessment: Normal  Communication   Communication: No difficulties  Cognition Arousal/Alertness: Awake/alert Behavior During Therapy: Restless;Impulsive Overall Cognitive Status: Impaired/Different from baseline Area of Impairment: Orientation;Attention;Memory;Following commands;Safety/judgement;Awareness;Problem solving                 Orientation Level: Disoriented to;Time;Place;Situation Current Attention Level: Selective Memory: Decreased short-term memory Following Commands: Follows one step commands consistently;Follows multi-step commands with increased time Safety/Judgement: Decreased awareness of safety Awareness: Intellectual Problem Solving: Slow processing;Requires verbal cues General Comments: Pt impulisve requiring multiple cues to wait for therapist to organize lines.  Very motivated      General Comments General comments (skin integrity, edema, etc.): VSS.  Visual fields intact, EOEM intact    Exercises     Assessment/Plan    PT Assessment Patient needs continued PT services  PT Problem List Decreased strength;Decreased mobility;Decreased safety awareness;Decreased range of motion;Decreased coordination;Decreased knowledge of precautions;Decreased activity tolerance;Decreased cognition;Decreased balance;Decreased knowledge of use of DME       PT Treatment Interventions DME instruction;Therapeutic activities;Gait training;Therapeutic exercise;Patient/family education;Stair training;Balance training;Functional mobility training;Cognitive remediation;Neuromuscular re-education    PT Goals (Current goals can be found in the Care Plan section)  Acute Rehab PT Goals Patient Stated Goal: Patient in agreement with SNF rehab stating "I cannot go home like this". PT Goal Formulation:  With patient Time For Goal Achievement: 05/26/21 Potential to Achieve Goals: Good Additional Goals Additional Goal #1: Pt will score >19 on DGI to indicate lower fall risk    Frequency Min 3X/week   Barriers to discharge Decreased caregiver support      Co-evaluation               AM-PAC PT "6 Clicks" Mobility  Outcome Measure Help needed turning from your back to your side while in a flat bed without using bedrails?: A Little Help needed moving from lying on your back to sitting on the side of a flat bed without using bedrails?: A Little Help needed moving to and from a bed to a chair (including a wheelchair)?: A Little Help needed standing up from a chair using your arms (e.g., wheelchair or bedside chair)?: A Little Help needed to walk in hospital room?: A Little Help needed climbing 3-5 steps with a railing? : A Little 6 Click Score: 18    End of Session Equipment Utilized During Treatment: Gait belt Activity Tolerance: Patient tolerated treatment well Patient left: with chair alarm set;in chair;with call bell/phone within reach Nurse Communication: Mobility status PT Visit Diagnosis: Unsteadiness on feet (R26.81);Other abnormalities of gait and mobility (R26.89)    Time: 1634-1700 PT Time Calculation (min) (ACUTE ONLY): 26 min   Charges:   PT Evaluation $PT Eval Low Complexity: 1 Low PT Treatments $Gait Training: 8-22 mins        Anise Salvo, PT Acute Rehab Services Pager [phone] [facility] [phone]   Rayetta Humphrey 05/12/2021, 5:20 PM

## 2021-05-12 NOTE — Evaluation (Signed)
Occupational Therapy Evaluation Patient Details Name: Kristina Gates MRN: [id number] DOB: 24-Apr-1956 Today's Date: 05/12/2021    History of Present Illness 65 y.o. female presenting to ED on 6/20 with AMS and slurred speech after being found down on the couch for unknown duration of time by her friend. Patient admitted with SIRS, acute renal failure with metabolic acidosis and acute metabolic encephalopathy. Recent hosptial admission 3/20222 with UTI, urosepsis, AKI, rhabdo adn multiple L rib fx after being found down by her daughter. PMHx significant for HTN, HLD, thyroid disease and DMII.   Clinical Impression   PTA patient was living alone in a private residence and was grossly I with ADLs/IADLs without AD. Patient currently functioning below baseline demonstrating observed ADLs including LB dressing and grooming standing at sink level with supervision A grossly for safety. Patient also limited by deficits listed below including cognitive deficits scoring 18/28 on SBT indicating impaired cognition. Patient would benefit from continued acute OT services in prep for safe d/c to next level of care. Patient states that she only has PRN supervision/assist and would be in agreement with SNF rehab prior to return home. OT will continue to follow acutely.      Follow Up Recommendations  SNF;Supervision/Assistance - 24 hour    Equipment Recommendations  None recommended by OT    Recommendations for Other Services       Precautions / Restrictions Precautions Precautions: Fall Precaution Comments: High fall risk Restrictions Weight Bearing Restrictions: No      Mobility Bed Mobility Overal bed mobility: Needs Assistance             General bed mobility comments: Patient attempting to get out of bed upon entry. Seated semi-EOB at time of OT arrival.    Transfers Overall transfer level: Needs assistance Equipment used: None Transfers: Sit to/from [employer] Sit to  Stand: Supervision Stand pivot transfers: Supervision       General transfer comment: Supervision A for safety. Does not require external assist.    Balance Overall balance assessment: Mild deficits observed, not formally tested                                         ADL either performed or assessed with clinical judgement   ADL Overall ADL's : Needs assistance/impaired     Grooming: Supervision/safety;Standing               Lower Body Dressing: Supervision/safety;Sit to/from stand Lower Body Dressing Details (indicate cue type and reason): Supervision A for safety. Patient instructed to take off 1 sock but took off both indicating poor attention and decreased command following. Toilet Transfer: Radiographer, therapeutic Details (indicate cue type and reason): Simulated with transfer to recliner with supervision A for safety.         Functional mobility during ADLs: Supervision/safety General ADL Comments: Patient limited by cognitive deficits and generalized weakness.     Vision Baseline Vision/History: Wears glasses Wears Glasses: At all times Patient Visual Report: No change from baseline Vision Assessment?: No apparent visual deficits     Perception     Praxis      Pertinent Vitals/Pain Pain Assessment: No/denies pain     Hand Dominance Right   Extremity/Trunk Assessment Upper Extremity Assessment Upper Extremity Assessment: Generalized weakness   Lower Extremity Assessment Lower Extremity Assessment: Defer to PT evaluation   Cervical / Trunk Assessment  Cervical / Trunk Assessment: Normal   Communication Communication Communication: No difficulties   Cognition Arousal/Alertness: Awake/alert Behavior During Therapy: Restless Overall Cognitive Status: Impaired/Different from baseline Area of Impairment: Orientation;Attention;Memory;Following commands;Safety/judgement;Awareness;Problem solving                  Orientation Level: Disoriented to;Time Current Attention Level: Selective Memory: Decreased short-term memory Following Commands: Follows one step commands consistently;Follows multi-step commands with increased time Safety/Judgement: Decreased awareness of safety Awareness: Emergent Problem Solving: Slow processing;Requires verbal cues General Comments: Patient attempting to get out of bed alone upon entry. Education provide on need to push call bell. Patient expressed verbal understanding but required continued education at conclusion of treatment session. Please refer below for SBT score and interpretation.   General Comments  SBT 18/28 indicating presence of cognitive deficits. Patient self aware of inability to return home independently at this time given cognitive deficits.    Exercises     Shoulder Instructions      Home Living Family/patient expects to be discharged to:: Private residence Living Arrangements: Alone Available Help at Discharge: Family;Friend(s);Available PRN/intermittently Type of Home: House Home Access: Stairs to enter     Home Layout: Two level;Able to live on main level with bedroom/bathroom     Bathroom Shower/Tub: Chief Strategy Officer: Standard Bathroom Accessibility: Yes              Prior Functioning/Environment Level of Independence: Independent        Comments: Patient independent with ADL, home mangement, meals, medication managment, bill payment. No AD for mobility in home/community dwellings.        OT Problem List: Decreased strength;Impaired balance (sitting and/or standing);Decreased cognition;Decreased safety awareness      OT Treatment/Interventions: Self-care/ADL training;Therapeutic exercise;Therapeutic activities;Cognitive remediation/compensation;Patient/family education;Balance training    OT Goals(Current goals can be found in the care plan section) Acute Rehab OT Goals Patient Stated Goal: Patient in  agreement with SNF rehab stating "I cannot go home like this". OT Goal Formulation: With patient Time For Goal Achievement: 05/26/21 Potential to Achieve Goals: Good ADL Goals Additional ADL Goal #1: Patient will complete ADLs with Mod I with good safety awareness. Additional ADL Goal #2: Patient will score <4/28 on SBT indicating improved cognition in prep for safe return to PLOF. Additional ADL Goal #3: Patient will complete pillbox test in 5 min with <2 errors in prep for safe return to PLOF.  OT Frequency: Min 2X/week   Barriers to D/C: Decreased caregiver support  Lives alone       Co-evaluation              AM-PAC OT "6 Clicks" Daily Activity     Outcome Measure Help from another person eating meals?: None Help from another person taking care of personal grooming?: A Little Help from another person toileting, which includes using toliet, bedpan, or urinal?: A Little Help from another person bathing (including washing, rinsing, drying)?: A Little Help from another person to put on and taking off regular upper body clothing?: A Little Help from another person to put on and taking off regular lower body clothing?: A Little 6 Click Score: 19   End of Session Equipment Utilized During Treatment: Gait belt Nurse Communication: Mobility status;Other (comment) (Decreased cognition.)  Activity Tolerance: Patient tolerated treatment well Patient left: in chair;with call bell/phone within reach;with chair alarm set  OT Visit Diagnosis: Unsteadiness on feet (R26.81);Muscle weakness (generalized) (M62.81);History of falling (Z91.81)  Time: 7322-0254 OT Time Calculation (min): 19 min Charges:  OT General Charges $OT Visit: 1 Visit OT Evaluation $OT Eval Low Complexity: 1 Low  Arnelle Nale H. OTR/L Supplemental OT, Department of rehab services [phone]  Rehan Holness R H. 05/12/2021, 2:39 PM

## 2021-05-12 NOTE — Evaluation (Signed)
Clinical/Bedside Swallow Evaluation Patient Details  Name: Kristina Gates MRN: [id number] Date of Birth: May 13, 1956  Today's Date: 05/12/2021 Time: SLP Start Time (ACUTE ONLY): 1110 SLP Stop Time (ACUTE ONLY): 1120 SLP Time Calculation (min) (ACUTE ONLY): 10 min  Past Medical History:  Past Medical History:  Diagnosis Date   Diabetes mellitus with renal complications (HCC) 2022   AKI in context of rhabdo   Hypercholesteremia    Hypertension    Postprocedural hypothyroidism    Past Surgical History:  Past Surgical History:  Procedure Laterality Date   COLON SURGERY     HPI:  Kristina Gates is a 65 y.o. female with a history of T2DM, HTN, HLD, hypothyroidism who was found at home alone confused on the cough in urine and feces. EMS arrived, found CBG to be 32, improved to 109 with dextrose. She remained confused in the ED, complaining only of abdominal pain. She was afebrile with acute renal failure (SCr 9.18, BUN 186), metabolic acidosis (bicarb 9), and hyponatremia (Na 128) among other abnormalities. Pt also presents with dysarthria. CT head negative. Being additionally worked up for potential ileus.   Assessment / Plan / Recommendation Clinical Impression  Pt found to have no signs of dysphagia or aspiraiton. She is still mildly confused and has slight dysarthria though there is no overt oral motor deficiencies on exam. Recommend pt continue clear liquid diet until OK for solids per MD, then ok to advance to regualr solids. Pt reports struggling to swallow pills and often times chews them. Advised her and RN to try pills whole in puree if desired. No SLP f/u needed will sign off.      Aspiration Risk       Diet Recommendation Regular;Thin liquid (initially clear liquids.)   Liquid Administration via: Cup;Straw Medication Administration: Whole meds with puree Compensations: Minimize environmental distractions Postural Changes: Seated upright at 90 degrees    Other   Recommendations Oral Care Recommendations: Patient independent with oral care   Follow up Recommendations None      Frequency and Duration            Prognosis        Swallow Study   General HPI: Kristina Gates is a 65 y.o. female with a history of T2DM, HTN, HLD, hypothyroidism who was found at home alone confused on the cough in urine and feces. EMS arrived, found CBG to be 32, improved to 109 with dextrose. She remained confused in the ED, complaining only of abdominal pain. She was afebrile with acute renal failure (SCr 9.18, BUN 186), metabolic acidosis (bicarb 9), and hyponatremia (Na 128) among other abnormalities. Pt also presents with dysarthria. CT head negative. Being additionally worked up for potential ileus. Type of Study: Bedside Swallow Evaluation Previous Swallow Assessment: none Diet Prior to this Study: NPO Temperature Spikes Noted: No Respiratory Status: Room air History of Recent Intubation: No Behavior/Cognition: Alert;Cooperative;Confused Oral Cavity Assessment: Within Functional Limits Oral Care Completed by SLP: No Oral Cavity - Dentition: Adequate natural dentition Vision: Functional for self-feeding Self-Feeding Abilities: Able to feed self Patient Positioning: Upright in bed Baseline Vocal Quality: Normal Volitional Cough: Strong Volitional Swallow: Able to elicit    Oral/Motor/Sensory Function Overall Oral Motor/Sensory Function: Within functional limits   Ice Chips     Thin Liquid Thin Liquid: Within functional limits    Nectar Thick Nectar Thick Liquid: Not tested   Honey Thick Honey Thick Liquid: Not tested   Puree Puree: Within functional limits Presentation:  Spoon   Solid     Solid: Within functional limits     Harlon Ditty, MA CCC-SLP  Acute Rehabilitation Services Pager [phone] Office [phone]  Claudine Mouton 05/12/2021,11:29 AM

## 2021-05-12 NOTE — Progress Notes (Signed)
PROGRESS NOTE  Kristina Gates  PZW:[id number] DOB: 07/12/1956 DOA: 05/10/2021 PCP: Dorisann Frames, MD   Brief Narrative: Kristina Gates is a 65 y.o. female with a history of T2DM, HTN, HLD, hypothyroidism who was found at home alone confused on the cough in urine and feces. EMS arrived, found CBG to be 32, improved to 109 with dextrose. She remained confused in the ED, complaining only of abdominal pain. She was afebrile with acute renal failure (SCr 9.18, BUN 186), metabolic acidosis (bicarb 9), and hyponatremia (Na 128) among other abnormalities including albumin 3.1, total bilirubin 1.5, CK 111, TSH 12.326, and lactic acid 1.1. IV fluids, empiric antibiotics were given with nephrology consulting, started on bicarbonate infusion.   Assessment & Plan: Principal Problem:   Acute renal failure (ARF) (HCC) Active Problems:   Postoperative hypothyroidism   Pure hypercholesterolemia   Type 2 diabetes mellitus with hypoglycemia without coma (HCC)   Metabolic acidosis  Acute renal failure with metabolic acidosis: Combination of prerenal (dehydration), postrenal with distended bladder, and possibly hypotension and ARB-related. UA is bland without RBCs, WBCs or mention of casts. U/S with distended bladder but no hydronephrosis.  - Improving with foley and fluids, both of which will continue. Acidosis resolved, changed IVF to avoid overly aggressive Na correction. Creatinine continues to improve. Will start clear and continue IVF. No evidence of overload at this time.  - Nephrology consultation appreciated - Avoid hypotension.  - Minimize nephrotoxins.   Acute metabolic encephalopathy: Multifactorial, due to renal failure, severe (and possibly sustained) hypoglycemia. Remains confused. CT head nonacute.  - Continue delirium precautions and optimizing metabolic profile.  - SLP once able to take po.   Adynamic ileus: Having BMs. Gastrografin made it to the colon on delayed film on my personal  interpretation of AXR. Abd exam very benign.  - Start clears, ok to proceed with SLP evaluation.  - No indication for NGT  Hyponatremia: Corrected briskly.  - Stabilized on hypotonic fluids, will continue and wean as renal function normalizes and po intake picks up.   Hypotension on essential HTN: Possibly due to dehydration as this has improved with IV fluids.  - Continue IVF and monitoring in PCU - Hold home ARB, diltiazem, hydralazine  SIRS: Leukocytosis to 11.3 on 6/21 without nidus of infection currently evident.  - Follow culture data, hopeful to DC abx if negative x48 hours.   T2DM with hypoglycemia: HbA1c in March 2022 was 5.5%.  - Continue D5-containing IVF.  - CBG q4h. Will hold insulins now and probably at discharge.  Hypothyroidism: TSH 12.326 with normal free T4.  - Continue synthroid at dose, recheck TFTs in 4-6 weeks.   HLD:  - Continue statin  Thrombocytopenia:  - Trend.  +FOBT: Unclear significance without gross bleeding and no anemia. - Monitor CBC  Obesity: Estimated body mass index is 31 kg/m as calculated from the following:   Height as of 02/15/21: 5\' 3"  (1.6 m).   Weight as of this encounter: 79.4 kg.  DVT prophylaxis: Heparin Code Status: Full Family Communication: Sister by phone daily Disposition Plan:  Status is: Inpatient  Remains inpatient appropriate because:Altered mental status, Ongoing diagnostic testing needed not appropriate for outpatient work up, and Inpatient level of care appropriate due to severity of illness  Dispo: The patient is from: Home              Anticipated d/c is to:  TBD  Patient currently is not medically stable to d/c.   Difficult to place patient No  Consultants:  Nephrology  Procedures:  None  Antimicrobials: Vancomycin, cefepime, flagyl   Subjective: Doesn't remember me from yesterday, states her belly still hurts some, but thinks it's better. Knows details about her sister/family, but  doesn't recall events leading to hospitalization. Significant urine output/24hrs. +BMs.  Objective: Vitals:   05/11/21 2300 05/12/21 0018 05/12/21 0400 05/12/21 0756  BP: 116/61 (!) 117/54 110/67 (!) 121/52  Pulse: 74 69 71 69  Resp: 20 20 20 20   Temp:  98.8 F (37.1 C) 97.6 F (36.4 C) 98 F (36.7 C)  TempSrc:  Axillary Oral Oral  SpO2: 97% 100% 98% 98%  Weight:        Intake/Output Summary (Last 24 hours) at 05/12/2021 1414 Last data filed at 05/12/2021 1109 Gross per 24 hour  Intake 453.47 ml  Output 2500 ml  Net -2046.53 ml   Filed Weights   05/10/21 0956  Weight: 79.4 kg   Gen: 65 y.o. female in no distress Pulm: Nonlabored breathing room air. Clear. CV: Regular rate and rhythm. No murmur, rub, or gallop. No JVD, no dependent edema. GI: Abdomen soft, not at all tender, non-distended, with normoactive bowel sounds.  Ext: Warm, no deformities Skin: No rashes, lesions or ulcers on visualized skin. Neuro: Alert and incompletely oriented. No focal neurological deficits. Psych: Judgement and insight appear fair. Mood euthymic & affect congruent. Behavior is appropriate.    Data Reviewed: I have personally reviewed following labs and imaging studies  CBC: Recent Labs  Lab 05/10/21 1020 05/10/21 1031 05/11/21 0142  WBC 8.6  --  11.3*  NEUTROABS 6.4  --   --   HGB 15.8* 15.6* 16.9*  HCT 46.2* 46.0 46.3*  MCV 93.0  --  88.9  PLT 223  --  127*   Basic Metabolic Panel: Recent Labs  Lab 05/10/21 1020 05/10/21 1031 05/11/21 0142 05/11/21 1508 05/12/21 0119  NA 135 128* 140 142 142  K 4.5 4.5 4.4 2.9* 2.9*  CL 88* 96* 101 102 107  CO2 9*  --  12* 20* 22  GLUCOSE 93 98 109* 127* 190*  BUN 196* >130* 158* 125* 96*  CREATININE 9.18* 9.70* 4.71* 2.60* 1.70*  CALCIUM 7.6*  --  8.2* 8.3* 8.2*  PHOS  --   --  9.1*  --  3.1   GFR: Estimated Creatinine Clearance: 32 mL/min (A) (by C-G formula based on SCr of 1.7 mg/dL (H)). Liver Function Tests: Recent Labs  Lab  05/10/21 1020 05/11/21 0142 05/12/21 0119  AST 23 27  --   ALT 7 14  --   ALKPHOS 61 57  --   BILITOT 1.5* 1.3*  --   PROT 6.5 6.0*  --   ALBUMIN 3.1* 2.8* 2.8*   No results for input(s): LIPASE, AMYLASE in the last 168 hours. No results for input(s): AMMONIA in the last 168 hours. Coagulation Profile: Recent Labs  Lab 05/10/21 1020  INR 1.0   Cardiac Enzymes: Recent Labs  Lab 05/10/21 1020  CKTOTAL 111   BNP (last 3 results) No results for input(s): PROBNP in the last 8760 hours. HbA1C: Recent Labs    05/10/21 1020  HGBA1C 5.4   CBG: Recent Labs  Lab 05/11/21 1946 05/12/21 0032 05/12/21 0557 05/12/21 0753 05/12/21 1126  GLUCAP 156* 180* 157* 207* 135*   Lipid Profile: No results for input(s): CHOL, HDL, LDLCALC, TRIG, CHOLHDL, LDLDIRECT in the last 72  hours. Thyroid Function Tests: Recent Labs    05/10/21 0954 05/10/21 1045  TSH 12.326*  --   FREET4  --  0.69   Anemia Panel: No results for input(s): VITAMINB12, FOLATE, FERRITIN, TIBC, IRON, RETICCTPCT in the last 72 hours. Urine analysis:    Component Value Date/Time   COLORURINE YELLOW 05/10/2021 1600   APPEARANCEUR HAZY (A) 05/10/2021 1600   LABSPEC 1.014 05/10/2021 1600   PHURINE 5.0 05/10/2021 1600   GLUCOSEU 50 (A) 05/10/2021 1600   HGBUR SMALL (A) 05/10/2021 1600   BILIRUBINUR NEGATIVE 05/10/2021 1600   KETONESUR NEGATIVE 05/10/2021 1600   PROTEINUR NEGATIVE 05/10/2021 1600   NITRITE NEGATIVE 05/10/2021 1600   LEUKOCYTESUR TRACE (A) 05/10/2021 1600   Recent Results (from the past 240 hour(s))  Resp Panel by RT-PCR (Flu A&B, Covid) Nasopharyngeal Swab     Status: None   Collection Time: 05/10/21  9:52 AM   Specimen: Nasopharyngeal Swab; Nasopharyngeal(NP) swabs in vial transport medium  Result Value Ref Range Status   SARS Coronavirus 2 by RT PCR NEGATIVE NEGATIVE Final    Comment: (NOTE) SARS-CoV-2 target nucleic acids are NOT DETECTED.  The SARS-CoV-2 RNA is generally detectable  in upper respiratory specimens during the acute phase of infection. The lowest concentration of SARS-CoV-2 viral copies this assay can detect is 138 copies/mL. A negative result does not preclude SARS-Cov-2 infection and should not be used as the sole basis for treatment or other patient management decisions. A negative result may occur with  improper specimen collection/handling, submission of specimen other than nasopharyngeal swab, presence of viral mutation(s) within the areas targeted by this assay, and inadequate number of viral copies(<138 copies/mL). A negative result must be combined with clinical observations, patient history, and epidemiological information. The expected result is Negative.  Fact Sheet for Patients:  BloggerCourse.comhttps://www.fda.gov/media/152166/download  Fact Sheet for Healthcare Providers:  SeriousBroker.ithttps://www.fda.gov/media/152162/download  This test is no t yet approved or cleared by the [place] FDA and  has been authorized for detection and/or diagnosis of SARS-CoV-2 by FDA under an Emergency Use Authorization (EUA). This EUA will remain  in effect (meaning this test can be used) for the duration of the COVID-19 declaration under Section 564(b)(1) of the Act, 21 U.S.C.section 360bbb-3(b)(1), unless the authorization is terminated  or revoked sooner.       Influenza A by PCR NEGATIVE NEGATIVE Final   Influenza B by PCR NEGATIVE NEGATIVE Final    Comment: (NOTE) The Xpert Xpress SARS-CoV-2/FLU/RSV plus assay is intended as an aid in the diagnosis of influenza from Nasopharyngeal swab specimens and should not be used as a sole basis for treatment. Nasal washings and aspirates are unacceptable for Xpert Xpress SARS-CoV-2/FLU/RSV testing.  Fact Sheet for Patients: BloggerCourse.comhttps://www.fda.gov/media/152166/download  Fact Sheet for Healthcare Providers: SeriousBroker.ithttps://www.fda.gov/media/152162/download  This test is not yet approved or cleared by the [place] FDA and has  been authorized for detection and/or diagnosis of SARS-CoV-2 by FDA under an Emergency Use Authorization (EUA). This EUA will remain in effect (meaning this test can be used) for the duration of the COVID-19 declaration under Section 564(b)(1) of the Act, 21 U.S.C. section 360bbb-3(b)(1), unless the authorization is terminated or revoked.  Performed at [facility], [address] [place], [place] [address]   Blood Culture (routine x 2)     Status: None (Preliminary result)   Collection Time: 05/10/21  9:52 AM   Specimen: BLOOD LEFT ARM  Result Value Ref Range Status   Specimen Description BLOOD LEFT ARM  Final   Special Requests   Final    BOTTLES DRAWN AEROBIC ONLY Blood Culture results may not be optimal due to an inadequate volume of blood received in culture bottles   Culture   Final    NO GROWTH 2 DAYS Performed at [facility], [address], [place], [place] [address]    Report Status PENDING  Incomplete  Blood Culture (routine x 2)     Status: None (Preliminary result)   Collection Time: 05/10/21  9:57 AM   Specimen: BLOOD  Result Value Ref Range Status   Specimen Description BLOOD LEFT ANTECUBITAL  Final   Special Requests   Final    BOTTLES DRAWN AEROBIC AND ANAEROBIC Blood Culture results may not be optimal due to an inadequate volume of blood received in culture bottles   Culture   Final    NO GROWTH 2 DAYS Performed at [facility], [address] [place], [place] [address]    Report Status PENDING  Incomplete  Urine culture     Status: Abnormal   Collection Time: 05/10/21  3:34 PM   Specimen: In/Out Cath Urine  Result Value Ref Range Status   Specimen Description IN/OUT CATH URINE  Final   Special Requests   Final    NONE Performed at [facility], [address] [place], [place] [address]    Culture (A)  Final    10,000 COLONIES/mL MULTIPLE SPECIES PRESENT, SUGGEST RECOLLECTION   Report Status 05/11/2021 FINAL  Final  C  Difficile Quick Screen w PCR reflex     Status: None   Collection Time: 05/10/21  9:00 PM   Specimen: STOOL  Result Value Ref Range Status   C Diff antigen NEGATIVE NEGATIVE Final   C Diff toxin NEGATIVE NEGATIVE Final   C Diff interpretation No C. difficile detected.  Final    Comment: Performed at [facility], [address] [place], [place] [address]      Radiology Studies: DG Abd 1 View  Result Date: 05/10/2021 CLINICAL DATA:  Abdominal pain.  Hypotension. ED notes: patient has a medical history significant of hypertension, hyperlipidemia, hypothyroidism, and diabetes mellitus type 2 presents after being found on her laying on the couch by her friend and former coworker this morning confused and with slurred speech. History is mostly obtained from the patient's friend who found her this morning. Apparently family had last spoken to the patient 3 days ago. At that time she complained of feeling under the weather and did not feel like eating or drinking. EXAM: ABDOMEN - 1 VIEW COMPARISON:  None. FINDINGS: Dilated loops of small bowel are noted in the central abdomen. Scattered air seen within a nondistended colon. Aortoiliac and femoral artery atherosclerotic calcifications. Soft tissues otherwise unremarkable. IMPRESSION: 1. Dilated small bowel loops consistent with partial small bowel obstruction versus a small bowel adynamic ileus. Electronically Signed   By: Amie Portland M.D.   On: 05/10/2021 17:44   CT HEAD WO CONTRAST  Result Date: 05/10/2021 CLINICAL DATA:  65 year old female with weakness and confusion for 5 days. EXAM: CT HEAD WITHOUT CONTRAST TECHNIQUE: Contiguous axial images were obtained from the base of the skull through the vertex without intravenous contrast. COMPARISON:  01/21/2021 CT FINDINGS: Brain: No evidence of acute infarction, hemorrhage, hydrocephalus, extra-axial collection or mass lesion/mass effect. Vascular: Carotid atherosclerotic calcifications are noted.  Skull: Normal. Negative for fracture or focal lesion. Sinuses/Orbits: No acute finding. Other: None. IMPRESSION: No evidence  of acute intracranial abnormality. Electronically Signed   By: Harmon Pier M.D.   On: 05/10/2021 15:28   US RENAL  Result Date: 05/10/2021 CLINICAL DATA:  Acute renal failure. EXAM: RENAL / URINARY TRACT ULTRASOUND COMPLETE COMPARISON:  None. FINDINGS: Right Kidney: Renal measurements: 11.5 x 4.4 x 5.8 cm = volume: 150.9 mL. Normal parenchymal echogenicity. Upper pole cyst measuring 2.1 x 2.1 x 2.5 cm. No other masses, no stones and no hydronephrosis. Left Kidney: Renal measurements: 12.5 x 6.9 x 5.6 cm = volume: 254 mL. Normal parenchymal echogenicity. Assessment somewhat limited due to a poor acoustic window. No convincing mass or stone. No hydronephrosis. Bladder: Bladder distended.  No wall thickening, mass or stone. Other: None. IMPRESSION: 1. No acute findings.  No hydronephrosis. 2. 2.5 cm cyst arises from the upper pole the right kidney. No other renal abnormalities. 3. Distended bladder. Electronically Signed   By: Amie Portland M.D.   On: 05/10/2021 17:33   DG Abd Portable 1V  Result Date: 05/11/2021 CLINICAL DATA:  8 hour delayed image EXAM: PORTABLE ABDOMEN - 1 VIEW COMPARISON:  CT 01/21/2021, radiograph 05/11/2021, 620 2022 FINDINGS: Enteral contrast visible within the stomach and small bowel. Diffuse dilatation of multiple small bowel loops up to 4.2 cm. Probable small amount of contrast within the right colon IMPRESSION: 1. Contrast within stomach and diffusely dilated small bowel with possible small amount of contrast in the right colon. Electronically Signed   By: Jasmine Pang M.D.   On: 05/11/2021 23:12   DG Abd Portable 1V  Result Date: 05/11/2021 CLINICAL DATA:  Partial small bowel obstruction EXAM: PORTABLE ABDOMEN - 1 VIEW COMPARISON:  Portable exam 0736 hours compared to 05/10/2021 FINDINGS: Air-filled dilated small bowel loops in the mid abdomen again seen.  Paucity of colonic gas. No bowel wall thickening. Osseous structures stable. No urinary tract calcification. IMPRESSION: Persistent small bowel distension. Electronically Signed   By: Ulyses Southward M.D.   On: 05/11/2021 10:22    Scheduled Meds:  atorvastatin  40 mg Oral Q supper   Chlorhexidine Gluconate Cloth  6 each Topical Daily   heparin  5,000 Units Subcutaneous Q8H   levothyroxine  100 mcg Oral QAC breakfast   pantoprazole  40 mg Oral Daily   sodium chloride flush  3 mL Intravenous Q12H   Continuous Infusions:  ceFEPime (MAXIPIME) IV 2 g (05/12/21 1130)   dextrose 5 % and 0.45 % NaCl with KCl 40 mEq/L 100 mL/hr at 05/12/21 0457     LOS: 2 days   Time spent: 35 minutes.  Tyrone Nine, MD [facility] [email or web address] 05/12/2021, 2:14 PM

## 2021-05-12 NOTE — Progress Notes (Signed)
[facility] ROUNDING NOTE   Subjective:   Interval History: Kristina Gates is a 65 y.o. female.  Who has a history of diabetes type 2, hypertension.  She was admitted recently to [facility] 01/21/2021 and discharged 02/01/2021.  During hospitalization it was noted that she had recurrent episodes of SVT.  Cardiology was consulted and she was placed on Cardizem.  She was also found to have a UTI and possible sepsis.  She was discharged off antibiotics.  She also had an element of nontraumatic rhabdomyolysis with a CPK level of greater than 20,000.  She was brought back to the emergency room on 05/10/2021 after it was noted that she had failed to contact her family since Friday.  She had no appetite had not been eating and drinking.  When the emergency room came in and found her to have a blood glucose of 20.  She was brought to the emergency room and thought to be severely dehydrated.  She was resuscitated with IV fluids.  Home medications included diltiazem hydralazine losartan.  In the emergency room she was found to be severely hypertensive with a blood pressure 75/48.  Blood pressure 121/53 pulse 68 temperature 98 O2 sats 99% room air  Urine output 3900 cc 05/12/2019   Sodium 142 potassium 2.9 chloride 107 CO2 22 BUN 96 creatinine 1.7 glucose 190 calcium 8.2 glucose 190    Objective:  Vital signs in last 24 hours:  Temp:  [97.6 F (36.4 C)-99 F (37.2 C)] 98 F (36.7 C) (06/22 0756) Pulse Rate:  [65-74] 69 (06/22 0756) Resp:  [17-20] 20 (06/22 0756) BP: (105-121)/(46-67) 121/52 (06/22 0756) SpO2:  [97 %-100 %] 98 % (06/22 0756)  Weight change:  Filed Weights   05/10/21 0956  Weight: 79.4 kg    Intake/Output: I/O last 3 completed shifts: In: 1110.6 [I.V.:910.6; IV Piggyback:200] Out: 3900 [Urine:3900]   Intake/Output this shift:  Total I/O In: -  Out: 1000 [Urine:1000]  CVS- RRR no murmurs rubs or gallops RS- CTA no wheezes or rales ABD- BS present  soft non-distended EXT- no edema   Basic Metabolic Panel: Recent Labs  Lab 05/10/21 1020 05/10/21 1031 05/11/21 0142 05/11/21 1508 05/12/21 0119  NA 135 128* 140 142 142  K 4.5 4.5 4.4 2.9* 2.9*  CL 88* 96* 101 102 107  CO2 9*  --  12* 20* 22  GLUCOSE 93 98 109* 127* 190*  BUN 196* >130* 158* 125* 96*  CREATININE 9.18* 9.70* 4.71* 2.60* 1.70*  CALCIUM 7.6*  --  8.2* 8.3* 8.2*  PHOS  --   --  9.1*  --  3.1     Liver Function Tests: Recent Labs  Lab 05/10/21 1020 05/11/21 0142 05/12/21 0119  AST 23 27  --   ALT 7 14  --   ALKPHOS 61 57  --   BILITOT 1.5* 1.3*  --   PROT 6.5 6.0*  --   ALBUMIN 3.1* 2.8* 2.8*    No results for input(s): LIPASE, AMYLASE in the last 168 hours. No results for input(s): AMMONIA in the last 168 hours.  CBC: Recent Labs  Lab 05/10/21 1020 05/10/21 1031 05/11/21 0142  WBC 8.6  --  11.3*  NEUTROABS 6.4  --   --   HGB 15.8* 15.6* 16.9*  HCT 46.2* 46.0 46.3*  MCV 93.0  --  88.9  PLT 223  --  127*     Cardiac Enzymes: Recent Labs  Lab 05/10/21 1020  CKTOTAL 111  BNP: Invalid input(s): POCBNP  CBG: Recent Labs  Lab 05/11/21 1525 05/11/21 1946 05/12/21 0032 05/12/21 0557 05/12/21 0753  GLUCAP 125* 156* 180* 157* 207*     Microbiology: Results for orders placed or performed during the hospital encounter of 05/10/21  Resp Panel by RT-PCR (Flu A&B, Covid) Nasopharyngeal Swab     Status: None   Collection Time: 05/10/21  9:52 AM   Specimen: Nasopharyngeal Swab; Nasopharyngeal(NP) swabs in vial transport medium  Result Value Ref Range Status   SARS Coronavirus 2 by RT PCR NEGATIVE NEGATIVE Final    Comment: (NOTE) SARS-CoV-2 target nucleic acids are NOT DETECTED.  The SARS-CoV-2 RNA is generally detectable in upper respiratory specimens during the acute phase of infection. The lowest concentration of SARS-CoV-2 viral copies this assay can detect is 138 copies/mL. A negative result does not preclude  SARS-Cov-2 infection and should not be used as the sole basis for treatment or other patient management decisions. A negative result may occur with  improper specimen collection/handling, submission of specimen other than nasopharyngeal swab, presence of viral mutation(s) within the areas targeted by this assay, and inadequate number of viral copies(<138 copies/mL). A negative result must be combined with clinical observations, patient history, and epidemiological information. The expected result is Negative.  Fact Sheet for Patients:  BloggerCourse.com  Fact Sheet for Healthcare Providers:  SeriousBroker.it  This test is no t yet approved or cleared by the [place] FDA and  has been authorized for detection and/or diagnosis of SARS-CoV-2 by FDA under an Emergency Use Authorization (EUA). This EUA will remain  in effect (meaning this test can be used) for the duration of the COVID-19 declaration under Section 564(b)(1) of the Act, 21 U.S.C.section 360bbb-3(b)(1), unless the authorization is terminated  or revoked sooner.       Influenza A by PCR NEGATIVE NEGATIVE Final   Influenza B by PCR NEGATIVE NEGATIVE Final    Comment: (NOTE) The Xpert Xpress SARS-CoV-2/FLU/RSV plus assay is intended as an aid in the diagnosis of influenza from Nasopharyngeal swab specimens and should not be used as a sole basis for treatment. Nasal washings and aspirates are unacceptable for Xpert Xpress SARS-CoV-2/FLU/RSV testing.  Fact Sheet for Patients: BloggerCourse.com  Fact Sheet for Healthcare Providers: SeriousBroker.it  This test is not yet approved or cleared by the [place] FDA and has been authorized for detection and/or diagnosis of SARS-CoV-2 by FDA under an Emergency Use Authorization (EUA). This EUA will remain in effect (meaning this test can be used) for the duration of  the COVID-19 declaration under Section 564(b)(1) of the Act, 21 U.S.C. section 360bbb-3(b)(1), unless the authorization is terminated or revoked.  Performed at [facility], [address], [place], [place] [address]   Blood Culture (routine x 2)     Status: None (Preliminary result)   Collection Time: 05/10/21  9:52 AM   Specimen: BLOOD LEFT ARM  Result Value Ref Range Status   Specimen Description BLOOD LEFT ARM  Final   Special Requests   Final    BOTTLES DRAWN AEROBIC ONLY Blood Culture results may not be optimal due to an inadequate volume of blood received in culture bottles   Culture   Final    NO GROWTH 2 DAYS Performed at [facility], [address] [place], [place] [address]    Report Status PENDING  Incomplete  Blood Culture (routine x 2)     Status: None (Preliminary result)   Collection Time: 05/10/21  9:57 AM   Specimen: BLOOD  Result Value Ref Range Status   Specimen Description BLOOD LEFT ANTECUBITAL  Final   Special Requests   Final    BOTTLES DRAWN AEROBIC AND ANAEROBIC Blood Culture results may not be optimal due to an inadequate volume of blood received in culture bottles   Culture   Final    NO GROWTH 2 DAYS Performed at [facility], [address], [place], [place] [address]    Report Status PENDING  Incomplete  Urine culture     Status: Abnormal   Collection Time: 05/10/21  3:34 PM   Specimen: In/Out Cath Urine  Result Value Ref Range Status   Specimen Description IN/OUT CATH URINE  Final   Special Requests   Final    NONE Performed at [facility], [address] [place], [place] [address]    Culture (A)  Final    10,000 COLONIES/mL MULTIPLE SPECIES PRESENT, SUGGEST RECOLLECTION   Report Status 05/11/2021 FINAL  Final  C Difficile Quick Screen w PCR reflex     Status: None   Collection Time: 05/10/21  9:00 PM   Specimen: STOOL  Result Value Ref Range Status   C Diff antigen NEGATIVE NEGATIVE Final   C Diff toxin  NEGATIVE NEGATIVE Final   C Diff interpretation No C. difficile detected.  Final    Comment: Performed at [facility], [address] [place], [place] [address]    Coagulation Studies: Recent Labs    05/10/21 1020  LABPROT 13.6  INR 1.0     Urinalysis: Recent Labs    05/10/21 1600  COLORURINE YELLOW  LABSPEC 1.014  PHURINE 5.0  GLUCOSEU 50*  HGBUR SMALL*  BILIRUBINUR NEGATIVE  KETONESUR NEGATIVE  PROTEINUR NEGATIVE  NITRITE NEGATIVE  LEUKOCYTESUR TRACE*       Imaging: DG Abd 1 View  Result Date: 05/10/2021 CLINICAL DATA:  Abdominal pain.  Hypotension. ED notes: patient has a medical history significant of hypertension, hyperlipidemia, hypothyroidism, and diabetes mellitus type 2 presents after being found on her laying on the couch by her friend and former coworker this morning confused and with slurred speech. History is mostly obtained from the patient's friend who found her this morning. Apparently family had last spoken to the patient 3 days ago. At that time she complained of feeling under the weather and did not feel like eating or drinking. EXAM: ABDOMEN - 1 VIEW COMPARISON:  None. FINDINGS: Dilated loops of small bowel are noted in the central abdomen. Scattered air seen within a nondistended colon. Aortoiliac and femoral artery atherosclerotic calcifications. Soft tissues otherwise unremarkable. IMPRESSION: 1. Dilated small bowel loops consistent with partial small bowel obstruction versus a small bowel adynamic ileus. Electronically Signed   By: Amie Portland M.D.   On: 05/10/2021 17:44   CT HEAD WO CONTRAST  Result Date: 05/10/2021 CLINICAL DATA:  65 year old female with weakness and confusion for 5 days. EXAM: CT HEAD WITHOUT CONTRAST TECHNIQUE: Contiguous axial images were obtained from the base of the skull through the vertex without intravenous contrast. COMPARISON:  01/21/2021 CT FINDINGS: Brain: No evidence of acute infarction, hemorrhage,  hydrocephalus, extra-axial collection or mass lesion/mass effect. Vascular: Carotid atherosclerotic calcifications are noted. Skull: Normal. Negative for fracture or focal lesion. Sinuses/Orbits: No acute finding. Other: None. IMPRESSION: No evidence of acute intracranial abnormality. Electronically Signed   By: Harmon Pier M.D.   On: 05/10/2021 15:28   US RENAL  Result Date: 05/10/2021 CLINICAL  DATA:  Acute renal failure. EXAM: RENAL / URINARY TRACT ULTRASOUND COMPLETE COMPARISON:  None. FINDINGS: Right Kidney: Renal measurements: 11.5 x 4.4 x 5.8 cm = volume: 150.9 mL. Normal parenchymal echogenicity. Upper pole cyst measuring 2.1 x 2.1 x 2.5 cm. No other masses, no stones and no hydronephrosis. Left Kidney: Renal measurements: 12.5 x 6.9 x 5.6 cm = volume: 254 mL. Normal parenchymal echogenicity. Assessment somewhat limited due to a poor acoustic window. No convincing mass or stone. No hydronephrosis. Bladder: Bladder distended.  No wall thickening, mass or stone. Other: None. IMPRESSION: 1. No acute findings.  No hydronephrosis. 2. 2.5 cm cyst arises from the upper pole the right kidney. No other renal abnormalities. 3. Distended bladder. Electronically Signed   By: Amie Portland M.D.   On: 05/10/2021 17:33   DG Abd Portable 1V  Result Date: 05/11/2021 CLINICAL DATA:  8 hour delayed image EXAM: PORTABLE ABDOMEN - 1 VIEW COMPARISON:  CT 01/21/2021, radiograph 05/11/2021, 620 2022 FINDINGS: Enteral contrast visible within the stomach and small bowel. Diffuse dilatation of multiple small bowel loops up to 4.2 cm. Probable small amount of contrast within the right colon IMPRESSION: 1. Contrast within stomach and diffusely dilated small bowel with possible small amount of contrast in the right colon. Electronically Signed   By: Jasmine Pang M.D.   On: 05/11/2021 23:12   DG Abd Portable 1V  Result Date: 05/11/2021 CLINICAL DATA:  Partial small bowel obstruction EXAM: PORTABLE ABDOMEN - 1 VIEW COMPARISON:   Portable exam 0736 hours compared to 05/10/2021 FINDINGS: Air-filled dilated small bowel loops in the mid abdomen again seen. Paucity of colonic gas. No bowel wall thickening. Osseous structures stable. No urinary tract calcification. IMPRESSION: Persistent small bowel distension. Electronically Signed   By: Ulyses Southward M.D.   On: 05/11/2021 10:22     Medications:    ceFEPime (MAXIPIME) IV     dextrose 5 % and 0.45 % NaCl with KCl 40 mEq/L 100 mL/hr at 05/12/21 0457   potassium chloride 10 mEq (05/12/21 0930)    atorvastatin  40 mg Oral Q supper   Chlorhexidine Gluconate Cloth  6 each Topical Daily   heparin  5,000 Units Subcutaneous Q8H   levothyroxine  100 mcg Oral QAC breakfast   pantoprazole  40 mg Oral Daily   sodium chloride flush  3 mL Intravenous Q12H   acetaminophen **OR** acetaminophen, albuterol, dextrose, ondansetron (ZOFRAN) IV  Assessment/ Plan:  Acute kidney injury.  This seems to be in the setting of hypertension volume depletion and concurrent use of ARB.  It is unclear whether she has been taking her angiotensin receptor blocker.  No hydronephrosis.  It does appear that the last serum creatinine was in the normal range in March 2022.  Urine bland.  Continue to avoid nephrotoxins renal panel daily.  I have a concern about using vancomycin.  I would limit this to 1 dose.  Place Foley catheter monitor urine output.  There does not appear to be any evidence of rhabdomyolysis.  Creatinine appears to be improving. 2. Hypertension/volume  -appears to be volume depleted.  Would recommend replacement with IV fluids.  She has metabolic acidosis therefore sodium bicarbonate would be indicated.  IV fluids switched to D5 half-normal saline 3.  Metabolic acidosis.  IV fluids switched to D5 half normal saline 4.  Hypotension.  Resolved.  Continue to hold antihypertensive medication. 5.  Altered mental status probably secondary to acute kidney injury hypotension and sepsis 6.  Hyponatremia  resolved  7.  Hypokalemia replete.    LOS: 2 Garnetta Buddy  :18 AM

## 2021-05-12 NOTE — Progress Notes (Signed)
[facility] night shift.  The nursing staff reports that the patient is frequently trying to get out of bed.  She has already pulled out to IV catheters.  They are requesting something to help her restlessness. No QT interval prolongation seen on ECG. Haloperidol 2.5 mg IVP x1 dose ordered.   Sanda Klein, MD.

## 2021-05-12 NOTE — Consult Note (Signed)
   [facility] Inpatient Consult   05/12/2021  NAKSHATRA KLOSE 1956/07/02 [id number]  [facility] [[facility]] Patient: [facility]  Electronic Medical Record reviewed for transition of care.  Patient is transitioning to a skilled nursing level of care to [place].  Needs are to be met at the skilled facility.  For questions or referrals, please contact:   Natividad Brood, RN BSN [facility] Liaison  [phone] business mobile phone Toll free office [phone]  Fax number: [phone] [email or web address]

## 2021-05-13 LAB — CBC
HCT: 43.8 % (ref 36.0–46.0)
Hemoglobin: 14.6 g/dL (ref 12.0–15.0)
MCH: 31.5 pg (ref 26.0–34.0)
MCHC: 33.3 g/dL (ref 30.0–36.0)
MCV: 94.6 fL (ref 80.0–100.0)
Platelets: 155 10*3/uL (ref 150–400)
RBC: 4.63 MIL/uL (ref 3.87–5.11)
RDW: 11.8 % (ref 11.5–15.5)
WBC: 7.2 10*3/uL (ref 4.0–10.5)
nRBC: 0 % (ref 0.0–0.2)

## 2021-05-13 LAB — RENAL FUNCTION PANEL
Albumin: 2.9 g/dL — ABNORMAL LOW (ref 3.5–5.0)
Anion gap: 9 (ref 5–15)
BUN: 59 mg/dL — ABNORMAL HIGH (ref 8–23)
CO2: 25 mmol/L (ref 22–32)
Calcium: 8.2 mg/dL — ABNORMAL LOW (ref 8.9–10.3)
Chloride: 107 mmol/L (ref 98–111)
Creatinine, Ser: 1.05 mg/dL — ABNORMAL HIGH (ref 0.44–1.00)
GFR, Estimated: 58 mL/min — ABNORMAL LOW (ref >60)
Glucose, Bld: 135 mg/dL — ABNORMAL HIGH (ref 70–99)
Phosphorus: 1.3 mg/dL — ABNORMAL LOW (ref 2.5–4.6)
Potassium: 3.4 mmol/L — ABNORMAL LOW (ref 3.5–5.1)
Sodium: 141 mmol/L (ref 135–145)

## 2021-05-13 LAB — GLUCOSE, CAPILLARY
Glucose-Capillary: 144 mg/dL — ABNORMAL HIGH (ref 70–99)
Glucose-Capillary: 115 mg/dL — ABNORMAL HIGH (ref 70–99)
Glucose-Capillary: 163 mg/dL — ABNORMAL HIGH (ref 70–99)
Glucose-Capillary: 111 mg/dL — ABNORMAL HIGH (ref 70–99)
Glucose-Capillary: 115 mg/dL — ABNORMAL HIGH (ref 70–99)

## 2021-05-13 MED ORDER — LOPERAMIDE HCL 2 MG PO CAPS
4.0000 mg | ORAL_CAPSULE | Freq: Once | ORAL | Status: AC
  Administered 2021-05-13: 4 mg via ORAL
  Filled 2021-05-13: qty 2

## 2021-05-13 MED ORDER — K PHOS MONO-SOD PHOS DI & MONO 155-852-130 MG PO TABS
500.0000 mg | ORAL_TABLET | Freq: Three times a day (TID) | ORAL | Status: DC
  Administered 2021-05-13 – 2021-05-14 (×4): 500 mg via ORAL
  Filled 2021-05-13 (×5): qty 2

## 2021-05-13 NOTE — Progress Notes (Signed)
PROGRESS NOTE  Kristina Gates  GLO:[id number] DOB: 1956/08/20 DOA: 05/10/2021 PCP: Dorisann Frames, MD   Brief Narrative: Kristina Gates is a 65 y.o. female with a history of T2DM, HTN, HLD, hypothyroidism who was found at home alone confused on the cough in urine and feces. EMS arrived, found CBG to be 32, improved to 109 with dextrose. She remained confused in the ED, complaining only of abdominal pain. She was afebrile with acute renal failure (SCr 9.18, BUN 186), metabolic acidosis (bicarb 9), and hyponatremia (Na 128) among other abnormalities including albumin 3.1, total bilirubin 1.5, CK 111, TSH 12.326, and lactic acid 1.1. IV fluids, empiric antibiotics were given with nephrology consulting, started on bicarbonate infusion. Antibiotics stopped after cultures negative. Renal function improving dramatically. Diet slowly advanced with improvement.   Assessment & Plan: Principal Problem:   Acute renal failure (ARF) (HCC) Active Problems:   Postoperative hypothyroidism   Pure hypercholesterolemia   Type 2 diabetes mellitus with hypoglycemia without coma (HCC)   Metabolic acidosis  Acute renal failure with metabolic acidosis: Combination of prerenal (dehydration), postrenal with distended bladder, and possibly hypotension and ARB-related. UA is bland without RBCs, WBCs or mention of casts. U/S with distended bladder but no hydronephrosis.  - Improving with foley and fluids. Will DC foley and monitor UOP closely. ADAT and wean down IVF.  - Avoid hypotension.  - Minimize nephrotoxins.   Acute metabolic encephalopathy: Multifactorial, due to renal failure, severe (and possibly sustained) hypoglycemia. Remains confused. CT head nonacute.  - Continue delirium precautions and optimizing metabolic profile.  - Improving today  Hypophosphatemia:  - Supplement and monitor  Adynamic ileus: Having BMs. Gastrografin made it to the colon on delayed film on my personal interpretation of AXR. Abd  exam very benign.  - Advance diet. Abd remains benign and having BMs.   Hyponatremia: Corrected briskly.  - Stabilized, normal.  Hypotension on essential HTN: Possibly due to dehydration as this has improved with IV fluids.  - Hold home ARB, diltiazem, hydralazine  SIRS: Leukocytosis to 11.3 on 6/21 without nidus of infection currently evident.  - DC abx with negative cultures.  T2DM with hypoglycemia: HbA1c in March 2022 was 5.5%.  - Continue D5-containing IVF.  - CBG q4h. Will hold insulins now and probably at discharge.  Hypothyroidism: TSH 12.326 with normal free T4.  - Continue synthroid at dose, recheck TFTs in 4-6 weeks.   HLD:  - Continue statin  Thrombocytopenia:  - Trend.  +FOBT: Unclear significance without gross bleeding and no anemia. - Monitor CBC  Obesity: Estimated body mass index is 31 kg/m as calculated from the following:   Height as of 02/15/21: 5\' 3"  (1.6 m).   Weight as of this encounter: 79.4 kg.  DVT prophylaxis: Heparin Code Status: Full Family Communication: Sister by phone daily Disposition Plan:  Status is: Inpatient  Remains inpatient appropriate because:Altered mental status, Ongoing diagnostic testing needed not appropriate for outpatient work up, and Inpatient level of care appropriate due to severity of illness  Dispo: The patient is from: Home              Anticipated d/c is to: SNF, likely 6/24.               Patient currently is not medically stable to d/c.   Difficult to place patient No  Consultants:  Nephrology  Procedures:  None  Antimicrobials: Vancomycin, cefepime, flagyl   Subjective: Much more oriented today, up to chair, interactive. No pain.  Eating well, wants to advance diet. Having 3 BMs in past day.   Objective: Vitals:   05/12/21 1900 05/12/21 2313 05/13/21 0259 05/13/21 0749  BP: (!) 123/55 (!) 107/55 (!) 114/56 129/69  Pulse: 70 68 65 64  Resp: 20 17 18 15   Temp: 97.9 F (36.6 C) 98 F (36.7 C)  98.5 F (36.9 C) 97.9 F (36.6 C)  TempSrc: Oral Oral Oral   SpO2:  98% 98% 100%  Weight:        Intake/Output Summary (Last 24 hours) at 05/13/2021 0957 Last data filed at 05/12/2021 1815 Gross per 24 hour  Intake 1200 ml  Output 1000 ml  Net 200 ml   Filed Weights   05/10/21 0956  Weight: 79.4 kg   Gen: 65 y.o. female in no distress Pulm: Nonlabored breathing room air. Clear. CV: Regular rate and rhythm. No murmur, rub, or gallop. No JVD, no dependent edema. GI: Abdomen soft, non-tender, non-distended, with normoactive bowel sounds.  Ext: Warm, no deformities Skin: No rashes, lesions or ulcers on visualized skin. Neuro: Alert and oriented to place, time, person, and somewhat to situation without focal neurological deficits. Psych: Judgement and insight appear marginal. Mood euthymic & affect congruent. Behavior is appropriate.    Data Reviewed: I have personally reviewed following labs and imaging studies  CBC: Recent Labs  Lab 05/10/21 1020 05/10/21 1031 05/11/21 0142 05/13/21 0213  WBC 8.6  --  11.3* 7.2  NEUTROABS 6.4  --   --   --   HGB 15.8* 15.6* 16.9* 14.6  HCT 46.2* 46.0 46.3* 43.8  MCV 93.0  --  88.9 94.6  PLT 223  --  127* 155   Basic Metabolic Panel: Recent Labs  Lab 05/10/21 1020 05/10/21 1031 05/11/21 0142 05/11/21 1508 05/12/21 0119 05/13/21 0213  NA 135 128* 140 142 142 141  K 4.5 4.5 4.4 2.9* 2.9* 3.4*  CL 88* 96* 101 102 107 107  CO2 9*  --  12* 20* 22 25  GLUCOSE 93 98 109* 127* 190* 135*  BUN 196* >130* 158* 125* 96* 59*  CREATININE 9.18* 9.70* 4.71* 2.60* 1.70* 1.05*  CALCIUM 7.6*  --  8.2* 8.3* 8.2* 8.2*  PHOS  --   --  9.1*  --  3.1 1.3*   GFR: Estimated Creatinine Clearance: 51.9 mL/min (A) (by C-G formula based on SCr of 1.05 mg/dL (H)). Liver Function Tests: Recent Labs  Lab 05/10/21 1020 05/11/21 0142 05/12/21 0119 05/13/21 0213  AST 23 27  --   --   ALT 7 14  --   --   ALKPHOS 61 57  --   --   BILITOT 1.5* 1.3*  --    --   PROT 6.5 6.0*  --   --   ALBUMIN 3.1* 2.8* 2.8* 2.9*   No results for input(s): LIPASE, AMYLASE in the last 168 hours. No results for input(s): AMMONIA in the last 168 hours. Coagulation Profile: Recent Labs  Lab 05/10/21 1020  INR 1.0   Cardiac Enzymes: Recent Labs  Lab 05/10/21 1020  CKTOTAL 111   BNP (last 3 results) No results for input(s): PROBNP in the last 8760 hours. HbA1C: Recent Labs    05/10/21 1020  HGBA1C 5.4   CBG: Recent Labs  Lab 05/12/21 1709 05/12/21 1926 05/12/21 2311 05/13/21 0258 05/13/21 0744  GLUCAP 175* 200* 171* 144* 115*   Lipid Profile: No results for input(s): CHOL, HDL, LDLCALC, TRIG, CHOLHDL, LDLDIRECT in the last 72 hours. Thyroid  Function Tests: Recent Labs    05/10/21 1045  FREET4 0.69   Anemia Panel: No results for input(s): VITAMINB12, FOLATE, FERRITIN, TIBC, IRON, RETICCTPCT in the last 72 hours. Urine analysis:    Component Value Date/Time   COLORURINE YELLOW 05/10/2021 1600   APPEARANCEUR HAZY (A) 05/10/2021 1600   LABSPEC 1.014 05/10/2021 1600   PHURINE 5.0 05/10/2021 1600   GLUCOSEU 50 (A) 05/10/2021 1600   HGBUR SMALL (A) 05/10/2021 1600   BILIRUBINUR NEGATIVE 05/10/2021 1600   KETONESUR NEGATIVE 05/10/2021 1600   PROTEINUR NEGATIVE 05/10/2021 1600   NITRITE NEGATIVE 05/10/2021 1600   LEUKOCYTESUR TRACE (A) 05/10/2021 1600   Recent Results (from the past 240 hour(s))  Resp Panel by RT-PCR (Flu A&B, Covid) Nasopharyngeal Swab     Status: None   Collection Time: 05/10/21  9:52 AM   Specimen: Nasopharyngeal Swab; Nasopharyngeal(NP) swabs in vial transport medium  Result Value Ref Range Status   SARS Coronavirus 2 by RT PCR NEGATIVE NEGATIVE Final    Comment: (NOTE) SARS-CoV-2 target nucleic acids are NOT DETECTED.  The SARS-CoV-2 RNA is generally detectable in upper respiratory specimens during the acute phase of infection. The lowest concentration of SARS-CoV-2 viral copies this assay can detect  is 138 copies/mL. A negative result does not preclude SARS-Cov-2 infection and should not be used as the sole basis for treatment or other patient management decisions. A negative result may occur with  improper specimen collection/handling, submission of specimen other than nasopharyngeal swab, presence of viral mutation(s) within the areas targeted by this assay, and inadequate number of viral copies(<138 copies/mL). A negative result must be combined with clinical observations, patient history, and epidemiological information. The expected result is Negative.  Fact Sheet for Patients:  BloggerCourse.com  Fact Sheet for Healthcare Providers:  SeriousBroker.it  This test is no t yet approved or cleared by the [place] FDA and  has been authorized for detection and/or diagnosis of SARS-CoV-2 by FDA under an Emergency Use Authorization (EUA). This EUA will remain  in effect (meaning this test can be used) for the duration of the COVID-19 declaration under Section 564(b)(1) of the Act, 21 U.S.C.section 360bbb-3(b)(1), unless the authorization is terminated  or revoked sooner.       Influenza A by PCR NEGATIVE NEGATIVE Final   Influenza B by PCR NEGATIVE NEGATIVE Final    Comment: (NOTE) The Xpert Xpress SARS-CoV-2/FLU/RSV plus assay is intended as an aid in the diagnosis of influenza from Nasopharyngeal swab specimens and should not be used as a sole basis for treatment. Nasal washings and aspirates are unacceptable for Xpert Xpress SARS-CoV-2/FLU/RSV testing.  Fact Sheet for Patients: BloggerCourse.com  Fact Sheet for Healthcare Providers: SeriousBroker.it  This test is not yet approved or cleared by the [place] FDA and has been authorized for detection and/or diagnosis of SARS-CoV-2 by FDA under an Emergency Use Authorization (EUA). This EUA will remain in effect  (meaning this test can be used) for the duration of the COVID-19 declaration under Section 564(b)(1) of the Act, 21 U.S.C. section 360bbb-3(b)(1), unless the authorization is terminated or revoked.  Performed at [facility], [address], [place], [place] [address]   Blood Culture (routine x 2)     Status: None (Preliminary result)   Collection Time: 05/10/21  9:52 AM   Specimen: BLOOD LEFT ARM  Result Value Ref Range Status   Specimen Description BLOOD LEFT ARM  Final   Special Requests   Final    BOTTLES DRAWN  AEROBIC ONLY Blood Culture results may not be optimal due to an inadequate volume of blood received in culture bottles   Culture   Final    NO GROWTH 2 DAYS Performed at [facility], [address] [place], [place] [address]    Report Status PENDING  Incomplete  Blood Culture (routine x 2)     Status: None (Preliminary result)   Collection Time: 05/10/21  9:57 AM   Specimen: BLOOD  Result Value Ref Range Status   Specimen Description BLOOD LEFT ANTECUBITAL  Final   Special Requests   Final    BOTTLES DRAWN AEROBIC AND ANAEROBIC Blood Culture results may not be optimal due to an inadequate volume of blood received in culture bottles   Culture   Final    NO GROWTH 2 DAYS Performed at [facility], [address] [place], [place] [address]    Report Status PENDING  Incomplete  Urine culture     Status: Abnormal   Collection Time: 05/10/21  3:34 PM   Specimen: In/Out Cath Urine  Result Value Ref Range Status   Specimen Description IN/OUT CATH URINE  Final   Special Requests   Final    NONE Performed at [facility], [address] [place], [place] [address]    Culture (A)  Final    10,000 COLONIES/mL MULTIPLE SPECIES PRESENT, SUGGEST RECOLLECTION   Report Status 05/11/2021 FINAL  Final  C Difficile Quick Screen w PCR reflex     Status: None   Collection Time: 05/10/21  9:00 PM   Specimen: STOOL  Result Value Ref Range Status   C Diff  antigen NEGATIVE NEGATIVE Final   C Diff toxin NEGATIVE NEGATIVE Final   C Diff interpretation No C. difficile detected.  Final    Comment: Performed at [facility], [address] [place], [place] [address]      Radiology Studies: DG Abd Portable 1V  Result Date: 05/11/2021 CLINICAL DATA:  8 hour delayed image EXAM: PORTABLE ABDOMEN - 1 VIEW COMPARISON:  CT 01/21/2021, radiograph 05/11/2021, 620 2022 FINDINGS: Enteral contrast visible within the stomach and small bowel. Diffuse dilatation of multiple small bowel loops up to 4.2 cm. Probable small amount of contrast within the right colon IMPRESSION: 1. Contrast within stomach and diffusely dilated small bowel with possible small amount of contrast in the right colon. Electronically Signed   By: Jasmine PangKim  Fujinaga M.D.   On: 05/11/2021 23:12    Scheduled Meds:  atorvastatin  40 mg Oral Q supper   Chlorhexidine Gluconate Cloth  6 each Topical Daily   heparin  5,000 Units Subcutaneous Q8H   levothyroxine  100 mcg Oral QAC breakfast   pantoprazole  40 mg Oral Daily   phosphorus  500 mg Oral TID   sodium chloride flush  3 mL Intravenous Q12H   Continuous Infusions:  dextrose 5 % and 0.45 % NaCl with KCl 40 mEq/L 100 mL/hr at 05/12/21 2133     LOS: 3 days   Time spent: 35 minutes.  Tyrone Nineyan B Aaliah Jorgenson, MD [facility] [email or web address] 05/13/2021, 9:57 AM

## 2021-05-13 NOTE — Progress Notes (Signed)
Pt repeatedly removing tele monitor and continues to get up unassisted. MD made aware, see new orders.

## 2021-05-13 NOTE — Progress Notes (Signed)
[facility] ROUNDING NOTE   Subjective:   Interval History: Kristina Gates is a 65 y.o. female.  Who has a history of diabetes type 2, hypertension.  She was admitted recently to [facility] 01/21/2021 and discharged 02/01/2021.  During hospitalization it was noted that she had recurrent episodes of SVT.  Cardiology was consulted and she was placed on Cardizem.  She was also found to have a UTI and possible sepsis.  She was discharged off antibiotics.  She also had an element of nontraumatic rhabdomyolysis with a CPK level of greater than 20,000.  She was brought back to the emergency room on 05/10/2021 after it was noted that she had failed to contact her family since Friday.  She had no appetite had not been eating and drinking.  When the emergency room came in and found her to have a blood glucose of 20.  She was brought to the emergency room and thought to be severely dehydrated.  She was resuscitated with IV fluids.  Home medications included diltiazem hydralazine losartan.  In the emergency room she was found to be severely hypertensive with a blood pressure 75/48.  Blood pressure 129/69 pulse 75 temperature 97.9 O2 sats 100% room air  Urine output 1000 cc 05/12/2021   Sodium 141 potassium 3.4 chloride 107 CO2 25 BUN 59 creatinine 1.05 glucose 135 calcium 8.2 hemoglobin 14.6    Objective:  Vital signs in last 24 hours:  Temp:  [97.8 F (36.6 C)-98.5 F (36.9 C)] 97.9 F (36.6 C) (06/23 0749) Pulse Rate:  [64-71] 64 (06/23 0749) Resp:  [14-20] 15 (06/23 0749) BP: (103-129)/(55-69) 129/69 (06/23 0749) SpO2:  [98 %-100 %] 100 % (06/23 0749)  Weight change:  Filed Weights   05/10/21 0956  Weight: 79.4 kg    Intake/Output: I/O last 3 completed shifts: In: 1200 [P.O.:1200] Out: 1650 [Urine:1650]   Intake/Output this shift:  No intake/output data recorded.  CVS- RRR no murmurs rubs or gallops RS- CTA no wheezes or rales ABD- BS present soft  non-distended EXT- no edema   Basic Metabolic Panel: Recent Labs  Lab 05/10/21 1020 05/10/21 1031 05/11/21 0142 05/11/21 1508 05/12/21 0119 05/13/21 0213  NA 135 128* 140 142 142 141  K 4.5 4.5 4.4 2.9* 2.9* 3.4*  CL 88* 96* 101 102 107 107  CO2 9*  --  12* 20* 22 25  GLUCOSE 93 98 109* 127* 190* 135*  BUN 196* >130* 158* 125* 96* 59*  CREATININE 9.18* 9.70* 4.71* 2.60* 1.70* 1.05*  CALCIUM 7.6*  --  8.2* 8.3* 8.2* 8.2*  PHOS  --   --  9.1*  --  3.1 1.3*     Liver Function Tests: Recent Labs  Lab 05/10/21 1020 05/11/21 0142 05/12/21 0119 05/13/21 0213  AST 23 27  --   --   ALT 7 14  --   --   ALKPHOS 61 57  --   --   BILITOT 1.5* 1.3*  --   --   PROT 6.5 6.0*  --   --   ALBUMIN 3.1* 2.8* 2.8* 2.9*    No results for input(s): LIPASE, AMYLASE in the last 168 hours. No results for input(s): AMMONIA in the last 168 hours.  CBC: Recent Labs  Lab 05/10/21 1020 05/10/21 1031 05/11/21 0142 05/13/21 0213  WBC 8.6  --  11.3* 7.2  NEUTROABS 6.4  --   --   --   HGB 15.8* 15.6* 16.9* 14.6  HCT 46.2* 46.0  46.3* 43.8  MCV 93.0  --  88.9 94.6  PLT 223  --  127* 155     Cardiac Enzymes: Recent Labs  Lab 05/10/21 1020  CKTOTAL 111     BNP: Invalid input(s): POCBNP  CBG: Recent Labs  Lab 05/12/21 1709 05/12/21 1926 05/12/21 2311 05/13/21 0258 05/13/21 0744  GLUCAP 175* 200* 171* 144* 115*     Microbiology: Results for orders placed or performed during the hospital encounter of 05/10/21  Resp Panel by RT-PCR (Flu A&B, Covid) Nasopharyngeal Swab     Status: None   Collection Time: 05/10/21  9:52 AM   Specimen: Nasopharyngeal Swab; Nasopharyngeal(NP) swabs in vial transport medium  Result Value Ref Range Status   SARS Coronavirus 2 by RT PCR NEGATIVE NEGATIVE Final    Comment: (NOTE) SARS-CoV-2 target nucleic acids are NOT DETECTED.  The SARS-CoV-2 RNA is generally detectable in upper respiratory specimens during the acute phase of infection. The  lowest concentration of SARS-CoV-2 viral copies this assay can detect is 138 copies/mL. A negative result does not preclude SARS-Cov-2 infection and should not be used as the sole basis for treatment or other patient management decisions. A negative result may occur with  improper specimen collection/handling, submission of specimen other than nasopharyngeal swab, presence of viral mutation(s) within the areas targeted by this assay, and inadequate number of viral copies(<138 copies/mL). A negative result must be combined with clinical observations, patient history, and epidemiological information. The expected result is Negative.  Fact Sheet for Patients:  BloggerCourse.com  Fact Sheet for Healthcare Providers:  SeriousBroker.it  This test is no t yet approved or cleared by the [place] FDA and  has been authorized for detection and/or diagnosis of SARS-CoV-2 by FDA under an Emergency Use Authorization (EUA). This EUA will remain  in effect (meaning this test can be used) for the duration of the COVID-19 declaration under Section 564(b)(1) of the Act, 21 U.S.C.section 360bbb-3(b)(1), unless the authorization is terminated  or revoked sooner.       Influenza A by PCR NEGATIVE NEGATIVE Final   Influenza B by PCR NEGATIVE NEGATIVE Final    Comment: (NOTE) The Xpert Xpress SARS-CoV-2/FLU/RSV plus assay is intended as an aid in the diagnosis of influenza from Nasopharyngeal swab specimens and should not be used as a sole basis for treatment. Nasal washings and aspirates are unacceptable for Xpert Xpress SARS-CoV-2/FLU/RSV testing.  Fact Sheet for Patients: BloggerCourse.com  Fact Sheet for Healthcare Providers: SeriousBroker.it  This test is not yet approved or cleared by the [place] FDA and has been authorized for detection and/or diagnosis of SARS-CoV-2 by FDA under  an Emergency Use Authorization (EUA). This EUA will remain in effect (meaning this test can be used) for the duration of the COVID-19 declaration under Section 564(b)(1) of the Act, 21 U.S.C. section 360bbb-3(b)(1), unless the authorization is terminated or revoked.  Performed at [facility], [address], [place], [place] [address]   Blood Culture (routine x 2)     Status: None (Preliminary result)   Collection Time: 05/10/21  9:52 AM   Specimen: BLOOD LEFT ARM  Result Value Ref Range Status   Specimen Description BLOOD LEFT ARM  Final   Special Requests   Final    BOTTLES DRAWN AEROBIC ONLY Blood Culture results may not be optimal due to an inadequate volume of blood received in culture bottles   Culture   Final    NO GROWTH 2 DAYS Performed at [facility], [address] [place], [place] [address]    Report Status PENDING  Incomplete  Blood Culture (routine x 2)     Status: None (Preliminary result)   Collection Time: 05/10/21  9:57 AM   Specimen: BLOOD  Result Value Ref Range Status   Specimen Description BLOOD LEFT ANTECUBITAL  Final   Special Requests   Final    BOTTLES DRAWN AEROBIC AND ANAEROBIC Blood Culture results may not be optimal due to an inadequate volume of blood received in culture bottles   Culture   Final    NO GROWTH 2 DAYS Performed at [facility], [address] [place], [place] [address]    Report Status PENDING  Incomplete  Urine culture     Status: Abnormal   Collection Time: 05/10/21  3:34 PM   Specimen: In/Out Cath Urine  Result Value Ref Range Status   Specimen Description IN/OUT CATH URINE  Final   Special Requests   Final    NONE Performed at [facility], [address] [place], [place] [address]    Culture (A)  Final    10,000 COLONIES/mL MULTIPLE SPECIES PRESENT, SUGGEST RECOLLECTION   Report Status 05/11/2021 FINAL  Final  C Difficile Quick Screen w PCR reflex     Status: None   Collection Time: 05/10/21   9:00 PM   Specimen: STOOL  Result Value Ref Range Status   C Diff antigen NEGATIVE NEGATIVE Final   C Diff toxin NEGATIVE NEGATIVE Final   C Diff interpretation No C. difficile detected.  Final    Comment: Performed at [facility], [address] [place], [place] [address]    Coagulation Studies: Recent Labs    05/10/21 1020  LABPROT 13.6  INR 1.0     Urinalysis: Recent Labs    05/10/21 1600  COLORURINE YELLOW  LABSPEC 1.014  PHURINE 5.0  GLUCOSEU 50*  HGBUR SMALL*  BILIRUBINUR NEGATIVE  KETONESUR NEGATIVE  PROTEINUR NEGATIVE  NITRITE NEGATIVE  LEUKOCYTESUR TRACE*       Imaging: DG Abd Portable 1V  Result Date: 05/11/2021 CLINICAL DATA:  8 hour delayed image EXAM: PORTABLE ABDOMEN - 1 VIEW COMPARISON:  CT 01/21/2021, radiograph 05/11/2021, 620 2022 FINDINGS: Enteral contrast visible within the stomach and small bowel. Diffuse dilatation of multiple small bowel loops up to 4.2 cm. Probable small amount of contrast within the right colon IMPRESSION: 1. Contrast within stomach and diffusely dilated small bowel with possible small amount of contrast in the right colon. Electronically Signed   By: Jasmine PangKim  Fujinaga M.D.   On: 05/11/2021 23:12     Medications:    dextrose 5 % and 0.45 % NaCl with KCl 40 mEq/L 100 mL/hr at 05/12/21 2133    atorvastatin  40 mg Oral Q supper   Chlorhexidine Gluconate Cloth  6 each Topical Daily   heparin  5,000 Units Subcutaneous Q8H   levothyroxine  100 mcg Oral QAC breakfast   pantoprazole  40 mg Oral Daily   phosphorus  500 mg Oral TID   sodium chloride flush  3 mL Intravenous Q12H   acetaminophen **OR** acetaminophen, albuterol, dextrose, ondansetron (ZOFRAN) IV  Assessment/ Plan:  Acute kidney injury.  This seems to be in the setting of hypertension volume depletion and concurrent use of ARB.  It is unclear whether she has been taking her angiotensin receptor blocker.  No hydronephrosis.  It does appear that the last serum  creatinine was in the normal  range in March 2022.  Urine bland.  Continue to avoid nephrotoxins renal panel daily.  I have a concern about using vancomycin.  I would limit this to 1 dose.  Place Foley catheter monitor urine output.  There does not appear to be any evidence of rhabdomyolysis.  Creatinine appears to be improving.  We will sign off at this point. 2. Hypertension/volume  -appears to be volume depleted.  Would recommend replacement with IV fluids.  She has metabolic acidosis therefore sodium bicarbonate would be indicated.  IV fluids switched to D5 half-normal saline 3.  Metabolic acidosis.  IV fluids switched to D5 half normal saline 4.  Hypotension.  Resolved.  Continue to hold antihypertensive medication. 5.  Altered mental status probably secondary to acute kidney injury hypotension and sepsis 6.  Hyponatremia resolved 7.  Hypokalemia replete.    LOS: 3 Garnetta Buddy @TODAY @9 :32 AM

## 2021-05-13 NOTE — Consult Note (Signed)
   [facility] Inpatient Consult   05/13/2021  Kristina Gates 07-15-1956 [id number]  [facility] review: Follow up note: update for transition barriers  Patient is awaiting insurance auth for skilled nursing transition.  Reviewed inpatient [facility] LCSW notes for further details for [facility] of care from hospital.  Charlesetta Shanks, RN BSN CCM [facility] Liaison  [phone] business mobile phone Toll free office [phone]  Fax number: [phone] [email or web address]

## 2021-05-13 NOTE — Progress Notes (Signed)
Pt refused ,medication, educated on importance of medication. Will continue to monitor

## 2021-05-13 NOTE — NC FL2 (Signed)
[place] MEDICAID FL2 LEVEL OF CARE SCREENING TOOL     IDENTIFICATION  Patient Name: Kristina Gates Birthdate: Aug 10, 1956 Sex: female Admission Date (Current Location): 05/10/2021  [facility] and [place] Number:  Producer, television/film/video and Address:  [facility] [place], [address], [place], [place] [address]      Provider Number: [id number]  Attending Physician Name and Address:  Tyrone Nine, MD  Relative Name and Phone Number:  Allyson Sabal Sister [phone]    Current Level of Care: Hospital Recommended Level of Care: Skilled Nursing Facility Prior Approval Number:    Date Approved/Denied:   PASRR Number: [id number]  Discharge Plan: SNF    Current Diagnoses: Patient Active Problem List   Diagnosis Date Noted   Acute renal failure (ARF) (HCC) 05/10/2021   Metabolic acidosis 05/10/2021   Neutropenia (HCC) 02/10/2021   Mass of left side of neck 02/02/2021   SVT (supraventricular tachycardia) (HCC)    Traumatic rhabdomyolysis (HCC) 01/22/2021   Type 2 diabetes mellitus with hypoglycemia without coma (HCC)    Essential hypertension 01/21/2021   Hyperglycemia due to type 2 diabetes mellitus (HCC) 01/21/2021   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/2021   Pure hypercholesterolemia 01/21/2021   Sepsis (HCC) 01/21/2021    Orientation RESPIRATION BLADDER Height & Weight     Self, Time, Place  Normal Incontinent Weight: 175 lb (79.4 kg) Height:     BEHAVIORAL SYMPTOMS/MOOD NEUROLOGICAL BOWEL NUTRITION STATUS      Continent Diet (see discharge summary)  AMBULATORY STATUS COMMUNICATION OF NEEDS Skin   Limited Assist Verbally Normal                       Personal Care Assistance Level of Assistance  Bathing, Feeding, Dressing Bathing Assistance: Independent Feeding assistance: Independent Dressing Assistance: Independent     Functional Limitations Info  Sight, Hearing, Speech Sight Info: Adequate Hearing Info: Adequate Speech Info: Adequate     SPECIAL CARE FACTORS FREQUENCY  PT (By licensed PT), OT (By licensed OT)     PT Frequency: 5x week OT Frequency: 5x week            Contractures Contractures Info: Not present    Additional Factors Info  Code Status, Allergies Code Status Info: full Allergies Info: Metformin And Related, Zestril (Lisinopril)           Current Medications (05/13/2021):  This is the current hospital active medication list Current Facility-Administered Medications  Medication Dose Route Frequency Provider Last Rate Last Admin   acetaminophen (TYLENOL) tablet 650 mg  650 mg Oral Q6H PRN Clydie Braun, MD       Or   acetaminophen (TYLENOL) suppository 650 mg  650 mg Rectal Q6H PRN Madelyn Flavors A, MD       albuterol (PROVENTIL) (2.5 MG/3ML) 0.083% nebulizer solution 2.5 mg  2.5 mg Nebulization Q6H PRN Katrinka Blazing, Rondell A, MD       atorvastatin (LIPITOR) tablet 40 mg  40 mg Oral Q supper Madelyn Flavors A, MD   40 mg at 05/12/21 1703   Chlorhexidine Gluconate Cloth 2 % PADS 6 each  6 each Topical Daily Smith, Rondell A, MD   6 each at 05/13/21 0830   dextrose 5 % and 0.45 % NaCl with KCl 40 mEq/L infusion   Intravenous Continuous Tyrone Nine, MD 100 mL/hr at 05/12/21 2133 Restarted at 05/12/21 2133   dextrose 50 % solution 50 mL  50 mL Intravenous PRN Madelyn Flavors  A, MD       heparin injection 5,000 Units  5,000 Units Subcutaneous Q8H Madelyn Flavors A, MD   5,000 Units at 05/12/21 2024   levothyroxine (SYNTHROID) tablet 100 mcg  100 mcg Oral QAC breakfast Madelyn Flavors A, MD   100 mcg at 05/13/21 0830   ondansetron (ZOFRAN) injection 4 mg  4 mg Intravenous Q6H PRN Madelyn Flavors A, MD   4 mg at 05/10/21 1630   pantoprazole (PROTONIX) EC tablet 40 mg  40 mg Oral Daily Smith, Rondell A, MD   40 mg at 05/13/21 0830   phosphorus (K PHOS NEUTRAL) tablet 500 mg  500 mg Oral TID Tyrone Nine, MD   500 mg at 05/13/21 0830   sodium chloride flush (NS) 0.9 % injection 3 mL  3 mL Intravenous Q12H  Smith, Rondell A, MD   3 mL at 05/13/21 0830     Discharge Medications: Please see discharge summary for a list of discharge medications.  Relevant Imaging Results:  Relevant Lab Results:   Additional Information SSN# [id number] patient is vaccinated and boostered  Jadelyn Elks Jon, LCSW

## 2021-05-13 NOTE — Plan of Care (Signed)

## 2021-05-13 NOTE — TOC Initial Note (Signed)
Transition of Care [facility]) - Initial/Assessment Note    Patient Details  Name: Kristina Gates MRN: [id number] Date of Birth: 1956/04/16  [facility]) CM/SW Contact:    Joanne Chars, LCSW Phone Number: 05/13/2021, 1:22 PM  Clinical Narrative:    CSW met with pt regarding discharge plan.  Pt engaged but still appears somewhat confused with her responses.  Pt lives alone, her sister Einar Crow is in [place], permission given to speak with sister.  CSW discussed recommendation for SNF and pt response was vague but does eventually acknowledge she can't go home alone, was at [facility] recently, and would be open to a return.  Choice document given.  Current equipment in home: walker, shower chair.  CSW spoke with sister [place].  She was just cleared to leave isolation after having covid and is planning to fly to [place] over the weekend.  She is aware of pt confusion and is in process of talking with her about eventual move to [place] to stay with her.  She is in agreement with plan for pt to return to [facility] currently and did not want referral sent to other facilities.  CSW spoke with Perrin Smack at [facility] who did extend bed offer and also will initiate auth request for [facility].                 Expected Discharge Plan: Skilled Nursing Facility Barriers to Discharge: SNF Pending bed offer, Continued Medical Work up   Patient Goals and CMS Choice   CMS Medicare.gov Compare Post Acute Care list provided to:: Patient Choice offered to / list presented to : Patient  Expected Discharge Plan and Services Expected Discharge Plan: [place] Choice: [place] arrangements for the past 2 months: Single Family Home                                      Prior Living Arrangements/Services Living arrangements for the past 2 months: Single Family Home Lives with:: Self Patient language and need for interpreter  reviewed:: Yes Do you feel safe going back to the place where you live?: Yes      Need for Family Participation in Patient Care: Yes (Comment) Care giver support system in place?: Yes (comment) Current home services: Other (comment) (none) Criminal Activity/Legal Involvement Pertinent to Current Situation/Hospitalization: No - Comment as needed  Activities of Daily Living Home Assistive Devices/Equipment: None ADL Screening (condition at time of admission) Patient's cognitive ability adequate to safely complete daily activities?: Yes Is the patient deaf or have difficulty hearing?: No Does the patient have difficulty seeing, even when wearing glasses/contacts?: No Does the patient have difficulty concentrating, remembering, or making decisions?: No Patient able to express need for assistance with ADLs?: No Does the patient have difficulty dressing or bathing?: No Independently performs ADLs?: Yes (appropriate for developmental age) Does the patient have difficulty walking or climbing stairs?: No Weakness of Legs: Right Weakness of Arms/Hands: None  Permission Sought/Granted Permission sought to share information with : Family Supports Permission granted to share information with : Yes, Verbal Permission Granted  Share Information with NAME: sister Einar Crow  Permission granted to share info w AGENCY: SNF        Emotional Assessment Appearance:: Appears stated age Attitude/Demeanor/Rapport: Engaged Affect (typically observed): Pleasant Orientation: : Oriented to Self, Oriented to Place, Oriented  to Situation Alcohol / Substance Use: Not Applicable Psych Involvement: No (comment)  Admission diagnosis:  Hypovolemic shock (HCC) [R57.1] Hypoglycemia [E16.2] Acute renal failure (ARF) (HCC) [N17.9] AKI (acute kidney injury) ([place]) [N17.9] Acute kidney failure ([place]) [N17.9] Hypotension [I95.9] Patient Active Problem List   Diagnosis Date Noted   Acute renal failure (ARF) ([place])  76/28/3151   Metabolic acidosis 76/16/0737   Neutropenia ([place]) 02/10/2021   Mass of left side of neck 02/02/2021   SVT (supraventricular tachycardia) (HCC)    Traumatic rhabdomyolysis ([place]) 01/22/2021   Type 2 diabetes mellitus with hypoglycemia without coma ([place])    Essential hypertension 01/21/2021   Hyperglycemia due to type 2 diabetes mellitus ([place]) 01/21/2021   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 [place] [address] Phone: [phone] Fax: [phone]     Social Determinants of Health (SDOH) Interventions    Readmission Risk Interventions No flowsheet data found.

## 2021-05-13 NOTE — Progress Notes (Signed)
IV infiltrated. MD made aware of no IV access, said ok for now.

## 2021-05-14 ENCOUNTER — Ambulatory Visit: Payer: Medicare HMO | Admitting: Student

## 2021-05-14 DIAGNOSIS — E11649 Type 2 diabetes mellitus with hypoglycemia without coma: Secondary | ICD-10-CM | POA: Diagnosis not present

## 2021-05-14 DIAGNOSIS — M6281 Muscle weakness (generalized): Secondary | ICD-10-CM | POA: Diagnosis not present

## 2021-05-14 DIAGNOSIS — E08649 Diabetes mellitus due to underlying condition with hypoglycemia without coma: Secondary | ICD-10-CM | POA: Diagnosis not present

## 2021-05-14 DIAGNOSIS — Z03818 Encounter for observation for suspected exposure to other biological agents ruled out: Secondary | ICD-10-CM | POA: Diagnosis not present

## 2021-05-14 DIAGNOSIS — R5381 Other malaise: Secondary | ICD-10-CM | POA: Diagnosis not present

## 2021-05-14 DIAGNOSIS — E441 Mild protein-calorie malnutrition: Secondary | ICD-10-CM | POA: Diagnosis not present

## 2021-05-14 DIAGNOSIS — R2681 Unsteadiness on feet: Secondary | ICD-10-CM | POA: Diagnosis not present

## 2021-05-14 DIAGNOSIS — E872 Acidosis: Secondary | ICD-10-CM | POA: Diagnosis not present

## 2021-05-14 DIAGNOSIS — Z7401 Bed confinement status: Secondary | ICD-10-CM | POA: Diagnosis not present

## 2021-05-14 DIAGNOSIS — E1129 Type 2 diabetes mellitus with other diabetic kidney complication: Secondary | ICD-10-CM | POA: Diagnosis not present

## 2021-05-14 DIAGNOSIS — E7800 Pure hypercholesterolemia, unspecified: Secondary | ICD-10-CM | POA: Diagnosis not present

## 2021-05-14 DIAGNOSIS — R600 Localized edema: Secondary | ICD-10-CM | POA: Diagnosis not present

## 2021-05-14 DIAGNOSIS — E890 Postprocedural hypothyroidism: Secondary | ICD-10-CM | POA: Diagnosis not present

## 2021-05-14 DIAGNOSIS — E1165 Type 2 diabetes mellitus with hyperglycemia: Secondary | ICD-10-CM | POA: Diagnosis not present

## 2021-05-14 DIAGNOSIS — Z794 Long term (current) use of insulin: Secondary | ICD-10-CM | POA: Diagnosis not present

## 2021-05-14 DIAGNOSIS — R410 Disorientation, unspecified: Secondary | ICD-10-CM | POA: Diagnosis not present

## 2021-05-14 DIAGNOSIS — R531 Weakness: Secondary | ICD-10-CM | POA: Diagnosis not present

## 2021-05-14 DIAGNOSIS — I471 Supraventricular tachycardia: Secondary | ICD-10-CM | POA: Diagnosis not present

## 2021-05-14 DIAGNOSIS — R41841 Cognitive communication deficit: Secondary | ICD-10-CM | POA: Diagnosis not present

## 2021-05-14 DIAGNOSIS — J069 Acute upper respiratory infection, unspecified: Secondary | ICD-10-CM | POA: Diagnosis not present

## 2021-05-14 DIAGNOSIS — R278 Other lack of coordination: Secondary | ICD-10-CM | POA: Diagnosis not present

## 2021-05-14 DIAGNOSIS — N179 Acute kidney failure, unspecified: Secondary | ICD-10-CM | POA: Diagnosis not present

## 2021-05-14 LAB — RENAL FUNCTION PANEL
Albumin: 2.8 g/dL — ABNORMAL LOW (ref 3.5–5.0)
Anion gap: 10 (ref 5–15)
BUN: 39 mg/dL — ABNORMAL HIGH (ref 8–23)
CO2: 21 mmol/L — ABNORMAL LOW (ref 22–32)
Calcium: 8.1 mg/dL — ABNORMAL LOW (ref 8.9–10.3)
Chloride: 106 mmol/L (ref 98–111)
Creatinine, Ser: 0.93 mg/dL (ref 0.44–1.00)
GFR, Estimated: >60 mL/min (ref >60)
Glucose, Bld: 120 mg/dL — ABNORMAL HIGH (ref 70–99)
Phosphorus: 1.4 mg/dL — ABNORMAL LOW (ref 2.5–4.6)
Potassium: 3.3 mmol/L — ABNORMAL LOW (ref 3.5–5.1)
Sodium: 137 mmol/L (ref 135–145)

## 2021-05-14 LAB — RESP PANEL BY RT-PCR (FLU A&B, COVID) ARPGX2
Influenza A by PCR: NEGATIVE
Influenza B by PCR: NEGATIVE
SARS Coronavirus 2 by RT PCR: NEGATIVE

## 2021-05-14 LAB — GLUCOSE, CAPILLARY
Glucose-Capillary: 110 mg/dL — ABNORMAL HIGH (ref 70–99)
Glucose-Capillary: 129 mg/dL — ABNORMAL HIGH (ref 70–99)
Glucose-Capillary: 102 mg/dL — ABNORMAL HIGH (ref 70–99)
Glucose-Capillary: 103 mg/dL — ABNORMAL HIGH (ref 70–99)

## 2021-05-14 MED ORDER — POTASSIUM CHLORIDE CRYS ER 20 MEQ PO TBCR
40.0000 meq | EXTENDED_RELEASE_TABLET | Freq: Once | ORAL | Status: AC
  Administered 2021-05-14: 40 meq via ORAL
  Filled 2021-05-14: qty 2

## 2021-05-14 MED ORDER — SIMETHICONE 80 MG PO CHEW
80.0000 mg | CHEWABLE_TABLET | Freq: Four times a day (QID) | ORAL | Status: DC | PRN
  Administered 2021-05-14: 80 mg via ORAL
  Filled 2021-05-14 (×2): qty 1

## 2021-05-14 MED ORDER — K PHOS MONO-SOD PHOS DI & MONO 155-852-130 MG PO TABS: 500.0000 mg | ORAL_TABLET | Freq: Two times a day (BID) | ORAL | Status: DC

## 2021-05-14 MED ORDER — DOCUSATE SODIUM 100 MG PO CAPS: 100.0000 mg | ORAL_CAPSULE | Freq: Two times a day (BID) | ORAL | Status: DC | PRN

## 2021-05-14 NOTE — Progress Notes (Signed)
Pt complained of excessive gas and requested something. MD made aware, see new orders.

## 2021-05-14 NOTE — TOC Transition Note (Addendum)
Transition of Care [facility]) - CM/SW Discharge Note   Patient Details  Name: MANHA AMATO MRN: [id number] Date of Birth: Oct 28, 1956  [facility]) CM/SW Contact:  Lorri Frederick, LCSW Phone Number: 05/14/2021, 1:57 PM   Clinical Narrative:   Pt discharging to [facility], room 216.  RN call report to [phone].    Final next level of care: Skilled Nursing Facility Barriers to Discharge: Barriers Resolved   Patient Goals and CMS Choice   CMS Medicare.gov Compare Post Acute Care list provided to:: Patient Choice offered to / list presented to : Patient  Discharge Placement              Patient [facility] bed at: [facility] Patient to be transferred to facility by: [facility] Name of family member notified: sister Burney Gauze Patient and family notified of of transfer: 05/14/21  Discharge Plan and Services     Post Acute Care Choice: Skilled Nursing Facility                               Social Determinants of Health (SDOH) Interventions     Readmission Risk Interventions No flowsheet data found.

## 2021-05-14 NOTE — Plan of Care (Signed)
aDescription: Including pain rating scale, medication(s)/side effects and non-pharmacologic comfort measures Outcome: Adequate for Discharge   Problem: Health Behavior/Discharge Planning: Goal: Ability to manage health-related needs will improve Outcome: Adequate for Discharge   Problem: Clinical Measurements: Goal: Ability to maintain clinical measurements within normal limits will improve Outcome: Adequate for Discharge Goal: Will remain free from infection Outcome: Adequate for Discharge Goal: Diagnostic test results will improve Outcome: Adequate for Discharge Goal: Respiratory complications will improve Outcome: Adequate for Discharge Goal: Cardiovascular complication will be avoided Outcome: Adequate for Discharge   Problem: Activity: Goal: Risk for activity intolerance will decrease Outcome: Adequate for Discharge   Problem: Nutrition: Goal: Adequate nutrition will be maintained Outcome: Adequate for Discharge   Problem: Coping: Goal: Level of anxiety will decrease Outcome: Adequate for Discharge   Problem: Elimination: Goal: Will not experience complications related to bowel motility Outcome: Adequate for Discharge Goal: Will not experience complications related to urinary retention Outcome: Adequate for Discharge   Problem: Pain Managment: Goal: General experience of comfort will improve Outcome: Adequate for Discharge   Problem: Safety: Goal: Ability to remain free from injury will improve Outcome: Adequate for Discharge   Problem: Skin Integrity: Goal: Risk for impaired skin integrity will decrease Outcome: Adequate for Discharge   Problem: Fluid Volume: Goal: Hemodynamic stability will improve Outcome: Adequate for Discharge   Problem: Clinical Measurements: Goal: Diagnostic test results will improve Outcome: Adequate for Discharge Goal: Signs and symptoms of infection will decrease Outcome: Adequate for Discharge   Problem: Respiratory: Goal:  Ability to maintain adequate ventilation will improve Outcome: Adequate for Discharge

## 2021-05-14 NOTE — Care Management Important Message (Signed)
Important Message  Patient Details  Name: Kristina Gates MRN: [id number] Date of Birth: 01-Nov-1956   Medicare Important Message Given:  Yes - Important Message mailed due to current National Emergency   Verbal consent obtained due to current National Emergency  Relationship to patient: Self Contact Name: Mariell Call Date: 05/14/21  Time: 1047 Phone: [phone] Outcome: No Answer/Busy Important Message mailed to: Patient address on file    Orson Aloe 05/14/2021, 10:47 AM

## 2021-05-14 NOTE — Discharge Summary (Signed)
Physician Discharge Summary  Kristina Gates KJZ:[id number] DOB: 12/23/1955 DOA: 05/10/2021  PCP: Dorisann Frames, MD  Admit date: 05/10/2021 Discharge date: 05/14/2021  Admitted From: Home Disposition: SNF   Recommendations for Outpatient Follow-up:  Follow up with PCP in 1-2 weeks with repeat BMP, phosphorus.  Follow up TSH, T4, T3 in 4-6 weeks Follow blood sugars.  Home Health: N/A Equipment/Devices: Per SNF Discharge Condition: Stable CODE STATUS: Full Diet recommendation: Heart healthy, carb-modified  Brief/Interim Summary: Kristina Gates is a 65 y.o. female with a history of T2DM, HTN, HLD, hypothyroidism who was found at home alone confused on the cough in urine and feces. EMS arrived, found CBG to be 32, improved to 109 with dextrose. She remained confused in the ED, complaining only of abdominal pain. She was afebrile with acute renal failure (SCr 9.18, BUN 186), metabolic acidosis (bicarb 9), and hyponatremia (Na 128) among other abnormalities including albumin 3.1, total bilirubin 1.5, CK 111, TSH 12.326, and lactic acid 1.1. IV fluids, empiric antibiotics were given with nephrology consulting, started on bicarbonate infusion. Antibiotics stopped after cultures negative. Renal function improved dramatically even after IV fluids discontinued. Diet has been advanced and tolerated. With improved metabolic parameters, the patient's mental status improved, she still requires physical therapy to return to prior level of functioning and will be discharged to SNF to pursue short term rehabilitation.  Discharge Diagnoses:  Principal Problem:   Acute renal failure (ARF) (HCC) Active Problems:   Postoperative hypothyroidism   Pure hypercholesterolemia   Type 2 diabetes mellitus with hypoglycemia without coma (HCC)   Metabolic acidosis  Acute renal failure with metabolic acidosis: Combination of prerenal (dehydration), postrenal with distended bladder, and possibly hypotension and  ARB-related. UA is bland without RBCs, WBCs or mention of casts. U/S with distended bladder but no hydronephrosis. - Improved durably even after IVF discontinued. Cr at discharge is 0.9. Recommend monitoring UOP and prn bladder scan if suspicion of retention.  - Avoid hypotension. - Minimize nephrotoxins.   Acute metabolic encephalopathy: Multifactorial, due to renal failure, severe (and possibly sustained) hypoglycemia. CT head nonacute. Overall improving, now suspect an element of hospital delirium.  - Recommend delirium precautions and neuropsychiatric evaluation after discharge.    Hypophosphatemia: - Continue supplementing and monitoring at DC.   Adynamic ileus: Resolved. Gastrografin made it to the colon on delayed film  - Advance diet. Abd remains benign and having BMs.    Hyponatremia: Corrected, stable.   Hypotension on essential HTN: Possibly due to dehydration as this has improved with IV fluids. - BP remains normal-to-soft. Will continue to hold home ARB, hydralazine. Ok to continue diltiazem.   SIRS: Leukocytosis to 11.3 on 6/21 without nidus of infection currently evident. - DCed abx with negative cultures.   T2DM with hypoglycemia: HbA1c in March 2022 was 5.5%. - Will hold insulins now and probably at discharge.   Hypothyroidism: TSH 12.326 with normal free T4. - Continue synthroid at dose, recheck TFTs in 4-6 weeks.   HLD: - Continue statin   Thrombocytopenia: Resolved.   +FOBT: Unclear significance without gross bleeding and no anemia. - Consider further nonurgent work up after discharge.    Obesity: Estimated body mass index is 31 kg/m  Discharge Instructions  Allergies as of 05/14/2021       Reactions   Metformin And Related Cough   Zestril [lisinopril] Cough        Medication List     STOP taking these medications    hydrALAZINE 10 MG  tablet Commonly known as: APRESOLINE   losartan 100 MG tablet Commonly known as: COZAAR   NovoLOG  Mix 70/30 FlexPen (70-30) 100 UNIT/ML FlexPen Generic drug: insulin aspart protamine - aspart       TAKE these medications    acetaminophen 500 MG tablet Commonly known as: TYLENOL Take 500-1,000 mg by mouth every 6 (six) hours as needed for headache (pain).   atorvastatin 40 MG tablet Commonly known as: LIPITOR Take 40 mg by mouth daily with supper.   diltiazem 180 MG 24 hr capsule Commonly known as: CARDIZEM CD Take 1 capsule (180 mg total) by mouth daily. What changed: when to take this   docusate sodium 100 MG capsule Commonly known as: COLACE Take 1 capsule (100 mg total) by mouth 2 (two) times daily as needed (constipation).   levothyroxine 100 MCG tablet Commonly known as: SYNTHROID Take 100 mcg by mouth daily before breakfast.   multivitamin with minerals Tabs tablet Take 1 tablet by mouth daily with lunch.   phosphorus 155-852-130 MG tablet Commonly known as: K PHOS NEUTRAL Take 2 tablets (500 mg total) by mouth 2 (two) times daily.        Follow-up Information     Dorisann Frames, MD Follow up.   Specialty: Endocrinology Contact information: [address] [address] [place] [address] [phone]         Hillis Range, MD .   Specialty: Cardiology Contact information: [address] [place] [address] [phone]                Allergies  Allergen Reactions   Metformin And Related Cough   Zestril [Lisinopril] Cough    Consultations: Nephrology  Procedures/Studies: DG Abd 1 View  Result Date: 05/10/2021 CLINICAL DATA:  Abdominal pain.  Hypotension. ED notes: patient has a medical history significant of hypertension, hyperlipidemia, hypothyroidism, and diabetes mellitus type 2 presents after being found on her laying on the couch by her friend and former coworker this morning confused and with slurred speech. History is mostly obtained from the patient's friend who found her this morning. Apparently family  had last spoken to the patient 3 days ago. At that time she complained of feeling under the weather and did not feel like eating or drinking. EXAM: ABDOMEN - 1 VIEW COMPARISON:  None. FINDINGS: Dilated loops of small bowel are noted in the central abdomen. Scattered air seen within a nondistended colon. Aortoiliac and femoral artery atherosclerotic calcifications. Soft tissues otherwise unremarkable. IMPRESSION: 1. Dilated small bowel loops consistent with partial small bowel obstruction versus a small bowel adynamic ileus. Electronically Signed   By: Amie Portland M.D.   On: 05/10/2021 17:44   CT HEAD WO CONTRAST  Result Date: 05/10/2021 CLINICAL DATA:  65 year old female with weakness and confusion for 5 days. EXAM: CT HEAD WITHOUT CONTRAST TECHNIQUE: Contiguous axial images were obtained from the base of the skull through the vertex without intravenous contrast. COMPARISON:  01/21/2021 CT FINDINGS: Brain: No evidence of acute infarction, hemorrhage, hydrocephalus, extra-axial collection or mass lesion/mass effect. Vascular: Carotid atherosclerotic calcifications are noted. Skull: Normal. Negative for fracture or focal lesion. Sinuses/Orbits: No acute finding. Other: None. IMPRESSION: No evidence of acute intracranial abnormality. Electronically Signed   By: Harmon Pier M.D.   On: 05/10/2021 15:28   US RENAL  Result Date: 05/10/2021 CLINICAL DATA:  Acute renal failure. EXAM: RENAL / URINARY TRACT ULTRASOUND COMPLETE COMPARISON:  None. FINDINGS: Right Kidney: Renal measurements: 11.5 x 4.4 x 5.8  cm = volume: 150.9 mL. Normal parenchymal echogenicity. Upper pole cyst measuring 2.1 x 2.1 x 2.5 cm. No other masses, no stones and no hydronephrosis. Left Kidney: Renal measurements: 12.5 x 6.9 x 5.6 cm = volume: 254 mL. Normal parenchymal echogenicity. Assessment somewhat limited due to a poor acoustic window. No convincing mass or stone. No hydronephrosis. Bladder: Bladder distended.  No wall thickening, mass or  stone. Other: None. IMPRESSION: 1. No acute findings.  No hydronephrosis. 2. 2.5 cm cyst arises from the upper pole the right kidney. No other renal abnormalities. 3. Distended bladder. Electronically Signed   By: Amie Portlandavid  Ormond M.D.   On: 05/10/2021 17:33   DG Chest Port 1 View  Result Date: 05/10/2021 CLINICAL DATA:  Questionable sepsis EXAM: PORTABLE CHEST 1 VIEW COMPARISON:  01/21/2021 FINDINGS: The heart size and mediastinal contours are within normal limits. Both lungs are clear. Nonacute, callused fractures of the posterolateral left ribs. IMPRESSION: 1. No acute abnormality of the lungs in AP portable projection. 2. Nonacute, callused fractures of the posterolateral left ribs. Electronically Signed   By: Lauralyn PrimesAlex  Bibbey M.D.   On: 05/10/2021 11:42   DG Abd Portable 1V  Result Date: 05/11/2021 CLINICAL DATA:  8 hour delayed image EXAM: PORTABLE ABDOMEN - 1 VIEW COMPARISON:  CT 01/21/2021, radiograph 05/11/2021, 620 2022 FINDINGS: Enteral contrast visible within the stomach and small bowel. Diffuse dilatation of multiple small bowel loops up to 4.2 cm. Probable small amount of contrast within the right colon IMPRESSION: 1. Contrast within stomach and diffusely dilated small bowel with possible small amount of contrast in the right colon. Electronically Signed   By: Jasmine PangKim  Fujinaga M.D.   On: 05/11/2021 23:12   DG Abd Portable 1V  Result Date: 05/11/2021 CLINICAL DATA:  Partial small bowel obstruction EXAM: PORTABLE ABDOMEN - 1 VIEW COMPARISON:  Portable exam 0736 hours compared to 05/10/2021 FINDINGS: Air-filled dilated small bowel loops in the mid abdomen again seen. Paucity of colonic gas. No bowel wall thickening. Osseous structures stable. No urinary tract calcification. IMPRESSION: Persistent small bowel distension. Electronically Signed   By: Ulyses SouthwardMark  Boles M.D.   On: 05/11/2021 10:22     Subjective: Feels well, eating well, no pain or nausea or vomiting. Abdomen is not painful. Having BMs.  Amenable to SNF at DC.  Discharge Exam: Vitals:   05/13/21 1945 05/14/21 0625  BP: 123/60 107/64  Pulse: 74 98  Resp: 18 16  Temp: 97.7 F (36.5 C) 98.2 F (36.8 C)  SpO2: 99% 97%   General: Pt is alert, awake, not in acute distress Cardiovascular: RRR, S1/S2 +, no rubs, no gallops Respiratory: CTA bilaterally, no wheezing, no rhonchi Abdominal: Soft, NT, ND, bowel sounds + Extremities: No edema, no cyanosis  Labs: BNP (last 3 results) No results for input(s): BNP in the last 8760 hours. Basic Metabolic Panel: Recent Labs  Lab 05/11/21 0142 05/11/21 1508 05/12/21 0119 05/13/21 0213 05/14/21 0109  NA 140 142 142 141 137  K 4.4 2.9* 2.9* 3.4* 3.3*  CL 101 102 107 107 106  CO2 12* 20* 22 25 21*  GLUCOSE 109* 127* 190* 135* 120*  BUN 158* 125* 96* 59* 39*  CREATININE 4.71* 2.60* 1.70* 1.05* 0.93  CALCIUM 8.2* 8.3* 8.2* 8.2* 8.1*  PHOS 9.1*  --  3.1 1.3* 1.4*   Liver Function Tests: Recent Labs  Lab 05/10/21 1020 05/11/21 0142 05/12/21 0119 05/13/21 0213 05/14/21 0109  AST 23 27  --   --   --  ALT 7 14  --   --   --   ALKPHOS 61 57  --   --   --   BILITOT 1.5* 1.3*  --   --   --   PROT 6.5 6.0*  --   --   --   ALBUMIN 3.1* 2.8* 2.8* 2.9* 2.8*   No results for input(s): LIPASE, AMYLASE in the last 168 hours. No results for input(s): AMMONIA in the last 168 hours. CBC: Recent Labs  Lab 05/10/21 1020 05/10/21 1031 05/11/21 0142 05/13/21 0213  WBC 8.6  --  11.3* 7.2  NEUTROABS 6.4  --   --   --   HGB 15.8* 15.6* 16.9* 14.6  HCT 46.2* 46.0 46.3* 43.8  MCV 93.0  --  88.9 94.6  PLT 223  --  127* 155   Cardiac Enzymes: Recent Labs  Lab 05/10/21 1020  CKTOTAL 111   BNP: Invalid input(s): POCBNP CBG: Recent Labs  Lab 05/13/21 0744 05/13/21 1153 05/13/21 1620 05/13/21 1943 05/14/21 0747  GLUCAP 115* 163* 111* 115* 110*   D-Dimer No results for input(s): DDIMER in the last 72 hours. Hgb A1c No results for input(s): HGBA1C in the last 72  hours. Lipid Profile No results for input(s): CHOL, HDL, LDLCALC, TRIG, CHOLHDL, LDLDIRECT in the last 72 hours. Thyroid function studies No results for input(s): TSH, T4TOTAL, T3FREE, THYROIDAB in the last 72 hours.  Invalid input(s): FREET3 Anemia work up No results for input(s): VITAMINB12, FOLATE, FERRITIN, TIBC, IRON, RETICCTPCT in the last 72 hours. Urinalysis    Component Value Date/Time   COLORURINE YELLOW 05/10/2021 1600   APPEARANCEUR HAZY (A) 05/10/2021 1600   LABSPEC 1.014 05/10/2021 1600   PHURINE 5.0 05/10/2021 1600   GLUCOSEU 50 (A) 05/10/2021 1600   HGBUR SMALL (A) 05/10/2021 1600   BILIRUBINUR NEGATIVE 05/10/2021 1600   KETONESUR NEGATIVE 05/10/2021 1600   PROTEINUR NEGATIVE 05/10/2021 1600   NITRITE NEGATIVE 05/10/2021 1600   LEUKOCYTESUR TRACE (A) 05/10/2021 1600    Microbiology Recent Results (from the past 240 hour(s))  Resp Panel by RT-PCR (Flu A&B, Covid) Nasopharyngeal Swab     Status: None   Collection Time: 05/10/21  9:52 AM   Specimen: Nasopharyngeal Swab; Nasopharyngeal(NP) swabs in vial transport medium  Result Value Ref Range Status   SARS Coronavirus 2 by RT PCR NEGATIVE NEGATIVE Final    Comment: (NOTE) SARS-CoV-2 target nucleic acids are NOT DETECTED.  The SARS-CoV-2 RNA is generally detectable in upper respiratory specimens during the acute phase of infection. The lowest concentration of SARS-CoV-2 viral copies this assay can detect is 138 copies/mL. A negative result does not preclude SARS-Cov-2 infection and should not be used as the sole basis for treatment or other patient management decisions. A negative result may occur with  improper specimen collection/handling, submission of specimen other than nasopharyngeal swab, presence of viral mutation(s) within the areas targeted by this assay, and inadequate number of viral copies(<138 copies/mL). A negative result must be combined with clinical observations, patient history, and  epidemiological information. The expected result is Negative.  Fact Sheet for Patients:  BloggerCourse.com  Fact Sheet for Healthcare Providers:  SeriousBroker.it  This test is no t yet approved or cleared by the [place] FDA and  has been authorized for detection and/or diagnosis of SARS-CoV-2 by FDA under an Emergency Use Authorization (EUA). This EUA will remain  in effect (meaning this test can be used) for the duration of the COVID-19 declaration under Section 564(b)(1) of the  Act, 21 U.S.C.section 360bbb-3(b)(1), unless the authorization is terminated  or revoked sooner.       Influenza A by PCR NEGATIVE NEGATIVE Final   Influenza B by PCR NEGATIVE NEGATIVE Final    Comment: (NOTE) The Xpert Xpress SARS-CoV-2/FLU/RSV plus assay is intended as an aid in the diagnosis of influenza from Nasopharyngeal swab specimens and should not be used as a sole basis for treatment. Nasal washings and aspirates are unacceptable for Xpert Xpress SARS-CoV-2/FLU/RSV testing.  Fact Sheet for Patients: BloggerCourse.com  Fact Sheet for Healthcare Providers: SeriousBroker.it  This test is not yet approved or cleared by the [place] FDA and has been authorized for detection and/or diagnosis of SARS-CoV-2 by FDA under an Emergency Use Authorization (EUA). This EUA will remain in effect (meaning this test can be used) for the duration of the COVID-19 declaration under Section 564(b)(1) of the Act, 21 U.S.C. section 360bbb-3(b)(1), unless the authorization is terminated or revoked.  Performed at [facility], [address], [place], [place] [address]   Blood Culture (routine x 2)     Status: None (Preliminary result)   Collection Time: 05/10/21  9:52 AM   Specimen: BLOOD LEFT ARM  Result Value Ref Range Status   Specimen Description BLOOD LEFT ARM  Final   Special Requests    Final    BOTTLES DRAWN AEROBIC ONLY Blood Culture results may not be optimal due to an inadequate volume of blood received in culture bottles   Culture   Final    NO GROWTH 3 DAYS Performed at [facility], [address] [place], [place] [address]    Report Status PENDING  Incomplete  Blood Culture (routine x 2)     Status: None (Preliminary result)   Collection Time: 05/10/21  9:57 AM   Specimen: BLOOD  Result Value Ref Range Status   Specimen Description BLOOD LEFT ANTECUBITAL  Final   Special Requests   Final    BOTTLES DRAWN AEROBIC AND ANAEROBIC Blood Culture results may not be optimal due to an inadequate volume of blood received in culture bottles   Culture   Final    NO GROWTH 3 DAYS Performed at [facility], [address], [place], [place] [address]    Report Status PENDING  Incomplete  Urine culture     Status: Abnormal   Collection Time: 05/10/21  3:34 PM   Specimen: In/Out Cath Urine  Result Value Ref Range Status   Specimen Description IN/OUT CATH URINE  Final   Special Requests   Final    NONE Performed at [facility], [address] [place], [place] [address]    Culture (A)  Final    10,000 COLONIES/mL MULTIPLE SPECIES PRESENT, SUGGEST RECOLLECTION   Report Status 05/11/2021 FINAL  Final  C Difficile Quick Screen w PCR reflex     Status: None   Collection Time: 05/10/21  9:00 PM   Specimen: STOOL  Result Value Ref Range Status   C Diff antigen NEGATIVE NEGATIVE Final   C Diff toxin NEGATIVE NEGATIVE Final   C Diff interpretation No C. difficile detected.  Final    Comment: Performed at [facility], [address] [place], [place] [address]    Time coordinating discharge: Approximately 40 minutes  Tyrone Nine, MD  [facility] 05/14/2021, 8:38 AM

## 2021-05-14 NOTE — Progress Notes (Signed)
Report called and given to Nurse Sue Lush  at [facility].

## 2021-05-14 NOTE — Progress Notes (Signed)
Physical Therapy Treatment Patient Details Name: Kristina Gates MRN: [id number] DOB: [birth year] Today's Date: 05/14/2021    History of Present Illness Pt is 65 y.o. female presenting to ED on 6/20 with AMS and slurred speech after being found down on the couch for unknown duration of time by her friend. Patient admitted with SIRS, acute renal failure with metabolic acidosis and acute metabolic encephalopathy. Recent hosptial admission 3/20222 with UTI, urosepsis, AKI, rhabdo adn multiple L rib fx after being found down by her daughter. PMHx significant for HTN, HLD, thyroid disease and DMII.    PT Comments    Pt required supervision bed mobility, min guard assist transfers, and min guard assist ambulation 150' with RW. Pt in recliner with feet elevated at end of session.   Follow Up Recommendations  SNF;Supervision/Assistance - 24 hour     Equipment Recommendations  None recommended by PT    Recommendations for Other Services       Precautions / Restrictions Precautions Precautions: Fall Restrictions Weight Bearing Restrictions: No    Mobility  Bed Mobility Overal bed mobility: Needs Assistance Bed Mobility: Supine to Sit     Supine to sit: Supervision     General bed mobility comments: supervision for safety    Transfers Overall transfer level: Needs assistance Equipment used: Ambulation equipment used Transfers: Sit to/from Stand Sit to Stand: Min guard         General transfer comment: cues for hand placement, min guard for safety  Ambulation/Gait Ambulation/Gait assistance: Min guard Gait Distance (Feet): 150 Feet Assistive device: Rolling walker (2 wheeled) Gait Pattern/deviations: Step-through pattern;Drifts right/left;Decreased stride length Gait velocity: decreased Gait velocity interpretation: 1.31 - 2.62 ft/sec, indicative of limited community ambulator General Gait Details: directional cues and cues to stay on task needed as pt easily distracted in  hallway   Stairs             Wheelchair Mobility    Modified Rankin (Stroke Patients Only)       Balance Overall balance assessment: Needs assistance Sitting-balance support: No upper extremity supported;Feet supported Sitting balance-Leahy Scale: Good     Standing balance support: No upper extremity supported;Bilateral upper extremity supported;During functional activity Standing balance-Leahy Scale: Fair                              Cognition Arousal/Alertness: Awake/alert Behavior During Therapy: WFL for tasks assessed/performed Overall Cognitive Status: Impaired/Different from baseline Area of Impairment: Orientation;Attention;Memory;Following commands;Safety/judgement;Awareness;Problem solving                 Orientation Level: Disoriented to;Time;Situation Current Attention Level: Selective Memory: Decreased short-term memory Following Commands: Follows one step commands consistently;Follows multi-step commands inconsistently;Follows multi-step commands with increased time Safety/Judgement: Decreased awareness of safety Awareness: Emergent Problem Solving: Slow processing;Difficulty sequencing;Requires verbal cues        Exercises      General Comments        Pertinent Vitals/Pain Pain Assessment: No/denies pain    Home Living                      Prior Function            PT Goals (current goals can now be found in the care plan section) Acute Rehab PT Goals Patient Stated Goal: rehab then home Progress towards PT goals: Progressing toward goals    Frequency    Min 3X/week  PT Plan Current plan remains appropriate    Co-evaluation              AM-PAC PT "6 Clicks" Mobility   Outcome Measure  Help needed turning from your back to your side while in a flat bed without using bedrails?: None Help needed moving from lying on your back to sitting on the side of a flat bed without using bedrails?: A  Little Help needed moving to and from a bed to a chair (including a wheelchair)?: A Little Help needed standing up from a chair using your arms (e.g., wheelchair or bedside chair)?: A Little Help needed to walk in hospital room?: A Little Help needed climbing 3-5 steps with a railing? : A Little 6 Click Score: 19    End of Session Equipment Utilized During Treatment: Gait belt Activity Tolerance: Patient tolerated treatment well Patient left: in chair;with call bell/phone within reach;with chair alarm set Nurse Communication: Mobility status PT Visit Diagnosis: Unsteadiness on feet (R26.81);Other abnormalities of gait and mobility (R26.89)     Time: 5732-2025 PT Time Calculation (min) (ACUTE ONLY): 14 min  Charges:  $Gait Training: 8-22 mins                     Aida Raider, PT  Office # [phone] Pager [phone]    Ilda Foil 05/14/2021, 1:14 PM

## 2021-05-15 LAB — CULTURE, BLOOD (ROUTINE X 2)
Culture: NO GROWTH
Culture: NO GROWTH

## 2021-05-17 ENCOUNTER — Encounter: Payer: Self-pay | Admitting: Internal Medicine

## 2021-05-17 ENCOUNTER — Non-Acute Institutional Stay (SKILLED_NURSING_FACILITY): Payer: Medicare HMO | Admitting: Internal Medicine

## 2021-05-17 DIAGNOSIS — R410 Disorientation, unspecified: Secondary | ICD-10-CM

## 2021-05-17 DIAGNOSIS — E441 Mild protein-calorie malnutrition: Secondary | ICD-10-CM | POA: Diagnosis not present

## 2021-05-17 DIAGNOSIS — Z794 Long term (current) use of insulin: Secondary | ICD-10-CM

## 2021-05-17 DIAGNOSIS — E7800 Pure hypercholesterolemia, unspecified: Secondary | ICD-10-CM | POA: Diagnosis not present

## 2021-05-17 DIAGNOSIS — N179 Acute kidney failure, unspecified: Secondary | ICD-10-CM

## 2021-05-17 DIAGNOSIS — E890 Postprocedural hypothyroidism: Secondary | ICD-10-CM

## 2021-05-17 DIAGNOSIS — E08649 Diabetes mellitus due to underlying condition with hypoglycemia without coma: Secondary | ICD-10-CM

## 2021-05-17 DIAGNOSIS — E46 Unspecified protein-calorie malnutrition: Secondary | ICD-10-CM | POA: Insufficient documentation

## 2021-05-17 DIAGNOSIS — R4182 Altered mental status, unspecified: Secondary | ICD-10-CM | POA: Insufficient documentation

## 2021-05-17 NOTE — Assessment & Plan Note (Addendum)
[facility] reports potential drug : drug interaction between diltiazem and atorvastatin.  Diltiazem may increase the atorvastatin levels.  No lipids recorded in Epic. While at the SNF the atorvastatin will be held and rosuvastatin employed at 20 mg daily. PCP can revisit this post discharge.

## 2021-05-17 NOTE — Patient Instructions (Signed)
See assessment and plan under each diagnosis in the problem list and acutely for this visit 

## 2021-05-17 NOTE — Progress Notes (Signed)
NURSING HOME LOCATION:  [facility] ROOM NUMBER:  [id number]  CODE STATUS:  Full Code  PCP:  Otilio Saber PA, [facility] Endocrinologist Dorisann Frames MD  This is a comprehensive admission note to this SNFperformed on this date less than 30 days from date of admission. Included are preadmission medical/surgical history; reconciled medication list; family history; social history and comprehensive review of systems.  Corrections and additions to the records were documented. Comprehensive physical exam was also performed. Additionally a clinical summary was entered for each active diagnosis pertinent to this admission in the Problem List to enhance continuity of care.  HPI: Patient was hospitalized 6/20 - 05/14/2021 admitted from home with acute mental status changes.  She was found covered in urine and feces.  EMS documented a glucose of 32 at presentation.  Dextrose administration resulted in a glucose of 109.  She remained confused in the ED.CT of the head was nonacute.   She did complain of abdominal pain; adynamic ileus was present which subsequently resolved.  Acute renal failure was present with a creatinine of 9.18 and BUN of 186.  Metabolic acidosis was present with bicarb of 9 . Sodium was 128.  CK 111, TSH 12.326, lactic acid 1.1, total bilirubin 1.5, and albumin 3.1. The acute renal failure with metabolic acidosis was attributed to prerenal (dehydration), post renal with distended bladder, and possible ARB related hypotension.  UA was unremarkable without RBCs or WBCs.  Ultrasound did revealed a distended bladder but no hydronephrosis. IV fluids and empiric antibiotics were initiated.  Nephrology consulted and recommended bicarbonate infusion.  Cultures returned negative and antibiotics were stopped.  Renal function improved dramatically even after IV fluids were discontinued.  Creatinine at discharge was 0.9.  Diet was advanced and tolerated well. With improved  nutrition and rehydration her mental status improved.  She still required physical therapy due to marked debilitation.  SNF placement for short-term rehab was recommended. It was recommended that urine output be monitored with bladder scanning if there is suspicion of recurrent urinary retention. L-thyroxine 100 mcg daily was continued; recheck of the TFTs in 4-6 weeks was recommended. As profound hypoglycemia had been documented, insulins were held at admission and were not reinitiated at discharge.A1c was 5.5% in March.   She also exhibited thrombocytopenia which resolved.  FOBT was positive without gross bleeding or anemia.  It was recommended this be reevaluated following discharge from SNF.  Past medical and surgical history: Includes dyslipidemia, hypothyroidism, GERD, diabetes with renal complications, and essential hypertension. Surgeries include colon surgery.  Social history: Alcohol intake is "occasional"; she smokes intermittently.  Family history: Reviewed   Review of systems: Clinical neurocognitive deficits made validity of responses questionable .Date given as "June 2?, 22,000"..."200 & 2000"... & finally "2022". She named the POTUS. When I asked why she has been in the hospital her response was "sister called and I was confused".  She had no idea of the profound hypoglycemia or the acute renal failure.  She thought her sugar was "normal" and that "I had to take insulin".   Her main complaint at this time is fatigue. She states that she had abdominal pain but this has improved.  She also stated that she had previously had both constipation and diarrhea which have improved.  She denies any diabetic related symptoms. Despite the TSH greater than 12; she maintains that she was taking her L-thyroxine. She states that she has been told she snores but there is no mention of apnea.  Constitutional: No fever, significant weight change Eyes: No redness, discharge, pain, vision  change ENT/mouth: No nasal congestion, purulent discharge, earache, change in hearing, sore throat  Cardiovascular: No chest pain, palpitations, paroxysmal nocturnal dyspnea, claudication, edema  Respiratory: No cough, sputum production, hemoptysis, DOE Gastrointestinal: No heartburn, dysphagia, nausea /vomiting, rectal bleeding, melena Genitourinary: No dysuria, hematuria, pyuria, incontinence, nocturia Musculoskeletal: No joint stiffness, joint swelling, weakness, pain Dermatologic: No rash, pruritus, change in appearance of skin Neurologic: No dizziness, headache, syncope, seizures, numbness, tingling Psychiatric: No significant anxiety, depression, insomnia, anorexia Endocrine: No change in hair/skin/nails, excessive thirst, excessive hunger, excessive urination  Hematologic/lymphatic: No significant bruising, lymphadenopathy, abnormal bleeding Allergy/immunology: No itchy/watery eyes, significant sneezing, urticaria, angioedema  Physical exam:  Pertinent or positive findings: Facies appear to be somewhat blank and responses are slow.  The first and second heart sounds are accentuated slightly.  Pedal pulses are decreased but palpable.  She has trace-1/2+ edema.  Strength to opposition is fair in all extremities and symmetric.  She is slightly unsteady when she stands.  General appearance: Adequately nourished; no acute distress, increased work of breathing is present.   Lymphatic: No lymphadenopathy about the head, neck, axilla. Eyes: No conjunctival inflammation or lid edema is present. There is no scleral icterus. Ears:  External ear exam shows no significant lesions or deformities.   Nose:  External nasal examination shows no deformity or inflammation. Nasal mucosa are pink and moist without lesions, exudates Oral exam: There is no oropharyngeal erythema or exudate. Neck:  No thyromegaly, masses, tenderness noted.    Heart:  Normal rate and regular rhythm without gallop, murmur,  click, rub.  Lungs: Chest clear to auscultation without wheezes, rhonchi, rales, rubs. Abdomen: Bowel sounds are normal.  Abdomen is soft and nontender with no organomegaly, hernias, masses. GU: Deferred  Extremities:  No cyanosis, clubbing. Neurologic exam: Balance, Rhomberg, finger to nose testing could not be completed due to clinical state Skin: Warm & dry w/o tenting. No significant lesions or rash.  See clinical summary under each active problem in the Problem List with associated updated therapeutic plan

## 2021-05-19 NOTE — Assessment & Plan Note (Signed)
She maintains she was compliant with L thyroxine supplementation.

## 2021-05-19 NOTE — Assessment & Plan Note (Signed)
Nutrition assessment @ SNF

## 2021-05-19 NOTE — Assessment & Plan Note (Signed)
Responses markedly delayed. SLUMS is a mental status assessment of possible dementia published by the [facility] [employer].The test differentiates between high school or less education level in reference to presence or absence of dementia. I'll ask Speech Therapy to administer this.

## 2021-05-19 NOTE — Assessment & Plan Note (Signed)
CKD Stage 2 Medication List reviewed; no nephrotoxic agents identified. 

## 2021-05-25 ENCOUNTER — Encounter: Payer: Self-pay | Admitting: Adult Health

## 2021-05-25 ENCOUNTER — Non-Acute Institutional Stay (SKILLED_NURSING_FACILITY): Payer: Medicare HMO | Admitting: Adult Health

## 2021-05-25 DIAGNOSIS — R600 Localized edema: Secondary | ICD-10-CM | POA: Diagnosis not present

## 2021-05-25 DIAGNOSIS — E1129 Type 2 diabetes mellitus with other diabetic kidney complication: Secondary | ICD-10-CM | POA: Diagnosis not present

## 2021-05-25 DIAGNOSIS — E890 Postprocedural hypothyroidism: Secondary | ICD-10-CM | POA: Diagnosis not present

## 2021-05-25 NOTE — Progress Notes (Signed)
Location:  [facility] Room Number: 216 A Place of Service:  SNF (31) Provider:  Kenard Gower, DNP, FNP-BC  Patient Care Team: Dorisann Frames, MD as PCP - General (Endocrinology) Hillis Range, MD as PCP - Electrophysiology (Cardiology)  Extended Emergency Contact Information Primary Emergency Contact: Ward, [place] [facility] Phone: [phone] Relation: Sister Secondary Emergency Contact: Scharlene Gloss,  Home Phone: [phone] Relation: Friend  Code Status:  Full Code  Goals of care: Advanced Directive information Advanced Directives 05/17/2021  Does Patient Have a Medical Advance Directive? No  Would patient like information on creating a medical advance directive? No - Patient declined     Chief Complaint  Patient presents with   Acute Visit    Edema of lower extremities    HPI:  Pt is a 65 y.o. female seen today for edema of bilateral lower extremities. She denies pain to lower extremities. Noted to have steady gait when ambulating.  She was admitted to [facility] on 05/14/21 post hospital admission 05/10/21 to 05/14/21.  She has a PMH of type 2 diabetes mellitus, hypertension, hyperlipidemia and hypothyroidism.  She was found at home alone and confused with urine and feces.  EMS was called.  CBG was 32, given dextrose and CBG improved to 109.  She remained confused in the ED and was complaining of abdominal pain.  She had acute renal failure with serum creatinine 9.18 and BUN 186. Na was 128, TSH 12.326, total bilirubin 1.5, albumin 3.1 and lactic acid 1.1.  She was given IV fluids and empiric antibiotics.  Nephrology was consulted and was started on bicarbonate infusion.  Antibiotics were stopped after negative cultures .  Renal functions improved after IV fluids discontinued.  Diet was advanced and tolerated.  Her mental status improved with improved metabolic parameters.  She was transferred to [facility] for  short-term rehabilitation.   Past Medical History:  Diagnosis Date   Diabetes mellitus with renal complications (HCC) 2022   AKI in context of rhabdo   Hypercholesteremia    Hypertension    Postprocedural hypothyroidism    Past Surgical History:  Procedure Laterality Date   COLON SURGERY      Allergies  Allergen Reactions   Metformin And Related Cough   Zestril [Lisinopril] Cough    Outpatient Encounter Medications as of 05/25/2021  Medication Sig   acetaminophen (TYLENOL) 500 MG tablet Take 500-1,000 mg by mouth every 6 (six) hours as needed for headache (pain).   atorvastatin (LIPITOR) 40 MG tablet Take 40 mg by mouth daily with supper.   diltiazem (CARDIZEM CD) 180 MG 24 hr capsule Take 1 capsule (180 mg total) by mouth daily.   docusate sodium (COLACE) 100 MG capsule Take 1 capsule (100 mg total) by mouth 2 (two) times daily as needed (constipation).   levothyroxine (SYNTHROID) 100 MCG tablet Take 100 mcg by mouth daily before breakfast.   Multiple Vitamin (MULTIVITAMIN WITH MINERALS) TABS tablet Take 1 tablet by mouth daily with lunch.   phosphorus (K PHOS NEUTRAL) 155-852-130 MG tablet Take 2 tablets (500 mg total) by mouth 2 (two) times daily.   [DISCONTINUED] pantoprazole (PROTONIX) 40 MG tablet Take 1 tablet (40 mg total) by mouth 2 (two) times daily. (Patient not taking: No sig reported)   No facility-administered encounter medications on file as of 05/25/2021.    Review of Systems  GENERAL: No change in appetite, no fatigue, no weight changes, no fever or  chills  MOUTH and THROAT: Denies oral discomfort, gingival pain or bleeding RESPIRATORY: no cough, SOB, DOE, wheezing, hemoptysis CARDIAC: No chest pain or palpitations GI: No abdominal pain, diarrhea, constipation, heart burn, nausea or vomiting GU: Denies dysuria, frequency, hematuria, incontinence, or discharge NEUROLOGICAL: Denies dizziness, syncope, or headache PSYCHIATRIC: Denies feelings of depression or  anxiety. No report of hallucinations, insomnia, paranoia, or agitation   Immunization History  Administered Date(s) Administered   Influenza, Quadrivalent, Recombinant, Inj, Pf 08/21/2019, 08/12/2020   Janssen (J&J) SARS-COV-2 Vaccination 02/25/2020, 09/29/2020   Pneumococcal-Unspecified 07/22/2014   Zoster, Live 07/22/2014   Pertinent  Health Maintenance Due  Topic Date Due   FOOT EXAM  Never done   OPHTHALMOLOGY EXAM  Never done   URINE MICROALBUMIN  Never done   COLONOSCOPY (Pts 45-53yrs Insurance coverage will need to be confirmed)  Never done   MAMMOGRAM  Never done   DEXA SCAN  Never done   PNA vac Low Risk Adult (1 of 2 - PCV13) 02/28/2019   INFLUENZA VACCINE  06/21/2021   HEMOGLOBIN A1C  11/09/2021   No flowsheet data found.   Vitals:   05/25/21 1000  BP: 111/60  Pulse: 79  Resp: 16  Temp: 97.7 F (36.5 C)  Weight: 169 lb 9.6 oz (76.9 kg)  Height: 5\' 6"  (1.676 m)   Body mass index is 27.37 kg/m.  Physical Exam  GENERAL APPEARANCE: Well nourished. In no acute distress. Normal body habitus SKIN:  Skin is warm and dry.  MOUTH and THROAT: Lips are without lesions. Oral mucosa is moist and without lesions.  RESPIRATORY: Breathing is even & unlabored, BS CTAB CARDIAC: RRR, no murmur,no extra heart sounds,  Right foot 2+edema and Left foot 1+edema GI: Abdomen soft, normal BS, no masses, no tenderness NEUROLOGICAL: There is no tremor. Speech is clear. Alert and oriented X 3. PSYCHIATRIC:  Affect and behavior are appropriate  Labs reviewed: Recent Labs    01/28/21 0221 01/29/21 0217 01/30/21 0208 02/08/21 0000 05/12/21 0119 05/13/21 0213 05/14/21 0109  NA 136 134* 134*   < > 142 141 137  K 3.5 4.2 3.8   < > 2.9* 3.4* 3.3*  CL 101 102 101   < > 107 107 106  CO2 26 25 26    < > 22 25 21*  GLUCOSE 98 105* 119*   < > 190* 135* 120*  BUN 5* 7* 6*   < > 96* 59* 39*  CREATININE 0.55 0.68 0.66   < > 1.70* 1.05* 0.93  CALCIUM 8.8* 8.9 9.2   < > 8.2* 8.2* 8.1*   MG 1.6* 1.7 1.8  --   --   --   --   PHOS  --   --   --    < > 3.1 1.3* 1.4*   < > = values in this interval not displayed.   Recent Labs    01/27/21 0229 05/10/21 1020 05/11/21 0142 05/12/21 0119 05/13/21 0213 05/14/21 0109  AST 115* 23 27  --   --   --   ALT 107* 7 14  --   --   --   ALKPHOS 59 61 57  --   --   --   BILITOT 0.5 1.5* 1.3*  --   --   --   PROT 5.1* 6.5 6.0*  --   --   --   ALBUMIN 2.7* 3.1* 2.8* 2.8* 2.9* 2.8*   Recent Labs    01/24/21 0049 01/25/21 0545 01/30/21 03/27/21  05/10/21 1020 05/10/21 1031 05/11/21 0142 05/13/21 0213  WBC 8.3 6.9   < > 8.6  --  11.3* 7.2  NEUTROABS 5.6 4.3  --  6.4  --   --   --   HGB 11.2* 12.0   < > 15.8* 15.6* 16.9* 14.6  HCT 31.0* 34.3*   < > 46.2* 46.0 46.3* 43.8  MCV 92.5 92.5   < > 93.0  --  88.9 94.6  PLT 134* 160   < > 223  --  127* 155   < > = values in this interval not displayed.   Lab Results  Component Value Date   TSH 12.326 (H) 05/10/2021   Lab Results  Component Value Date   HGBA1C 5.4 05/10/2021    Significant Diagnostic Results in last 30 days:  DG Abd 1 View  Result Date: 05/10/2021 CLINICAL DATA:  Abdominal pain.  Hypotension. ED notes: patient has a medical history significant of hypertension, hyperlipidemia, hypothyroidism, and diabetes mellitus type 2 presents after being found on her laying on the couch by her friend and former coworker this morning confused and with slurred speech. History is mostly obtained from the patient's friend who found her this morning. Apparently family had last spoken to the patient 3 days ago. At that time she complained of feeling under the weather and did not feel like eating or drinking. EXAM: ABDOMEN - 1 VIEW COMPARISON:  None. FINDINGS: Dilated loops of small bowel are noted in the central abdomen. Scattered air seen within a nondistended colon. Aortoiliac and femoral artery atherosclerotic calcifications. Soft tissues otherwise unremarkable. IMPRESSION: 1. Dilated  small bowel loops consistent with partial small bowel obstruction versus a small bowel adynamic ileus. Electronically Signed   By: Amie Portland M.D.   On: 05/10/2021 17:44   CT HEAD WO CONTRAST  Result Date: 05/10/2021 CLINICAL DATA:  65 year old female with weakness and confusion for 5 days. EXAM: CT HEAD WITHOUT CONTRAST TECHNIQUE: Contiguous axial images were obtained from the base of the skull through the vertex without intravenous contrast. COMPARISON:  01/21/2021 CT FINDINGS: Brain: No evidence of acute infarction, hemorrhage, hydrocephalus, extra-axial collection or mass lesion/mass effect. Vascular: Carotid atherosclerotic calcifications are noted. Skull: Normal. Negative for fracture or focal lesion. Sinuses/Orbits: No acute finding. Other: None. IMPRESSION: No evidence of acute intracranial abnormality. Electronically Signed   By: Harmon Pier M.D.   On: 05/10/2021 15:28   US RENAL  Result Date: 05/10/2021 CLINICAL DATA:  Acute renal failure. EXAM: RENAL / URINARY TRACT ULTRASOUND COMPLETE COMPARISON:  None. FINDINGS: Right Kidney: Renal measurements: 11.5 x 4.4 x 5.8 cm = volume: 150.9 mL. Normal parenchymal echogenicity. Upper pole cyst measuring 2.1 x 2.1 x 2.5 cm. No other masses, no stones and no hydronephrosis. Left Kidney: Renal measurements: 12.5 x 6.9 x 5.6 cm = volume: 254 mL. Normal parenchymal echogenicity. Assessment somewhat limited due to a poor acoustic window. No convincing mass or stone. No hydronephrosis. Bladder: Bladder distended.  No wall thickening, mass or stone. Other: None. IMPRESSION: 1. No acute findings.  No hydronephrosis. 2. 2.5 cm cyst arises from the upper pole the right kidney. No other renal abnormalities. 3. Distended bladder. Electronically Signed   By: Amie Portland M.D.   On: 05/10/2021 17:33   DG Chest Port 1 View  Result Date: 05/10/2021 CLINICAL DATA:  Questionable sepsis EXAM: PORTABLE CHEST 1 VIEW COMPARISON:  01/21/2021 FINDINGS: The heart size and  mediastinal contours are within normal limits. Both lungs are clear. Nonacute, callused  fractures of the posterolateral left ribs. IMPRESSION: 1. No acute abnormality of the lungs in AP portable projection. 2. Nonacute, callused fractures of the posterolateral left ribs. Electronically Signed   By: Lauralyn PrimesAlex  Bibbey M.D.   On: 05/10/2021 11:42   DG Abd Portable 1V  Result Date: 05/11/2021 CLINICAL DATA:  8 hour delayed image EXAM: PORTABLE ABDOMEN - 1 VIEW COMPARISON:  CT 01/21/2021, radiograph 05/11/2021, 620 2022 FINDINGS: Enteral contrast visible within the stomach and small bowel. Diffuse dilatation of multiple small bowel loops up to 4.2 cm. Probable small amount of contrast within the right colon IMPRESSION: 1. Contrast within stomach and diffusely dilated small bowel with possible small amount of contrast in the right colon. Electronically Signed   By: Jasmine PangKim  Fujinaga M.D.   On: 05/11/2021 23:12   DG Abd Portable 1V  Result Date: 05/11/2021 CLINICAL DATA:  Partial small bowel obstruction EXAM: PORTABLE ABDOMEN - 1 VIEW COMPARISON:  Portable exam 0736 hours compared to 05/10/2021 FINDINGS: Air-filled dilated small bowel loops in the mid abdomen again seen. Paucity of colonic gas. No bowel wall thickening. Osseous structures stable. No urinary tract calcification. IMPRESSION: Persistent small bowel distension. Electronically Signed   By: Ulyses SouthwardMark  Boles M.D.   On: 05/11/2021 10:22    Assessment/Plan  1. Bilateral lower extremity edema -  will get bilateral venous ultrasound to rule out DVT -  encourage to elevate lower extremities  2. Postoperative hypothyroidism Lab Results  Component Value Date   TSH 12.326 (H) 05/10/2021   -  continue levothyroxine 100 mcg daily -  For repeat TSH, T4, T3 in 2 weeks  3. Type 2 diabetes mellitus with other diabetic kidney complication, without long-term current use of insulin (HCC) Lab Results  Component Value Date   HGBA1C 5.4 05/10/2021   -   diet-controlled     Family/ staff Communication: Discussed plan of care with resident and charge nurse.  Labs/tests ordered: Bilateral venous ultrasound of lower extremities  Goals of care:      Kenard GowerMonina Medina-Vargas, DNP, MSN, FNP-BC [facility] [phone] (Monday-Friday 8:00 a.m. - 5:00 p.m.) [phone] (after hours)

## 2021-05-31 ENCOUNTER — Encounter: Payer: Self-pay | Admitting: Adult Health

## 2021-05-31 ENCOUNTER — Non-Acute Institutional Stay (SKILLED_NURSING_FACILITY): Payer: Medicare HMO | Admitting: Adult Health

## 2021-05-31 DIAGNOSIS — N179 Acute kidney failure, unspecified: Secondary | ICD-10-CM | POA: Diagnosis not present

## 2021-05-31 DIAGNOSIS — I471 Supraventricular tachycardia, unspecified: Secondary | ICD-10-CM

## 2021-05-31 DIAGNOSIS — R600 Localized edema: Secondary | ICD-10-CM | POA: Diagnosis not present

## 2021-05-31 DIAGNOSIS — E890 Postprocedural hypothyroidism: Secondary | ICD-10-CM | POA: Diagnosis not present

## 2021-05-31 DIAGNOSIS — E7800 Pure hypercholesterolemia, unspecified: Secondary | ICD-10-CM

## 2021-05-31 LAB — BASIC METABOLIC PANEL
BUN: 8 (ref 4–21)
CO2: 30 — AB (ref 13–22)
Chloride: 100 (ref 99–108)
Creatinine: 0.6 (ref <=1.1)
Glucose: 132
Potassium: 3.4 (ref 3.4–5.3)
Sodium: 140 (ref 137–147)

## 2021-05-31 LAB — COMPREHENSIVE METABOLIC PANEL
Calcium: 8.9 (ref 8.7–10.7)
GFR calc Af Amer: >90
GFR calc non Af Amer: >90

## 2021-05-31 NOTE — Progress Notes (Signed)
Location:  [facility] Room Number: 216-A Place of Service:  SNF (31) Provider:  Kenard Gower, DNP, FNP-BC  Patient Care Team: Dorisann Frames, MD as PCP - General (Endocrinology) Hillis Range, MD as PCP - Electrophysiology (Cardiology)  Extended Emergency Contact Information Primary Emergency Contact: Ward, [facility] Phone: [phone] Relation: Sister Secondary Emergency Contact: Scharlene Gloss,  Home Phone: [phone] Relation: Friend  Code Status: Full   Goals of care: Advanced Directive information Advanced Directives 05/31/2021  Does Patient Have a Medical Advance Directive? No  Would patient like information on creating a medical advance directive? No - Patient declined     Chief Complaint  Patient presents with   Discharge Note    Discharge    HPI:  Pt is a 65 y.o. female who is for discharge home with Home health PT and OT on 05/31/21.  She was admitted to [facility] on 05/14/21 post hospital admission 05/10/21 to 05/14/21. She has a PMH of type 2 diabetes mellitus, hypertension, hyperlipidemia and hypothyroidism. She was found at home alone and confused with urine and feces. EMS was called. CBG was 32, was given dextrose and CBG improved to 109.  She remained confused in the ED and was complaining of abdominal pain.  She had acute renal failure with serum creatinine of 9.18 and BUN 186.  Sodium was 128, TSH 12.326, total bilirubin 1.5, albumin 3.1 and lactic acid 1.1.  She was given IV fluids and empiric antibiotics.  Nephrology was consulted and was started on bicarbonate infusion.  Antibiotics were stopped after negative cultures.  Renal functions improved after IV fluids discontinued.  Diet was advanced and tolerated.  Her mental status improved with improved metabolic parameters.  Patient was admitted to this facility for short-term rehabilitation after the patient's recent hospitalization.   Patient has completed SNF rehabilitation and therapy has cleared the patient for discharge.   Past Medical History:  Diagnosis Date   Diabetes mellitus with renal complications (HCC) 2022   AKI in context of rhabdo   Hypercholesteremia    Hypertension    Postprocedural hypothyroidism    Past Surgical History:  Procedure Laterality Date   COLON SURGERY      Allergies  Allergen Reactions   Metformin And Related Cough   Zestril [Lisinopril] Cough    Outpatient Encounter Medications as of 05/31/2021  Medication Sig   acetaminophen (TYLENOL) 500 MG tablet Take 500-1,000 mg by mouth every 6 (six) hours as needed for headache (pain).   bisacodyl (BISCOLAX) 10 MG suppository Place 10 mg rectally as needed. If not relieved by MOM, 1 dose in 24 hours as needed   diltiazem (CARDIZEM CD) 180 MG 24 hr capsule Take 1 capsule (180 mg total) by mouth daily.   docusate sodium (COLACE) 100 MG capsule Take 1 capsule (100 mg total) by mouth 2 (two) times daily as needed (constipation).   furosemide (LASIX) 20 MG tablet Take 20 mg by mouth daily.   levothyroxine (SYNTHROID) 100 MCG tablet Take 100 mcg by mouth daily before breakfast.   Multiple Vitamin (MULTIVITAMIN WITH MINERALS) TABS tablet Take 1 tablet by mouth daily with lunch.   NON FORMULARY Diet:Heart Healthy/CCD   phosphorus (K PHOS NEUTRAL) 155-852-130 MG tablet Take 2 tablets (500 mg total) by mouth 2 (two) times daily.   rosuvastatin (CRESTOR) 20 MG tablet Take 20 mg by mouth daily.   Sodium Phosphates (RA SALINE ENEMA RE) Place rectally.  If not relieved by Biscodyl suppository,1 dose/24 hrs as needed   [DISCONTINUED] atorvastatin (LIPITOR) 40 MG tablet Take 40 mg by mouth daily with supper.   [DISCONTINUED] pantoprazole (PROTONIX) 40 MG tablet Take 1 tablet (40 mg total) by mouth 2 (two) times daily. (Patient not taking: No sig reported)   No facility-administered encounter medications on file as of 05/31/2021.    Review of  Systems  GENERAL: No change in appetite, no fatigue, no weight changes, no fever, chills or weakness MOUTH and THROAT: Denies oral discomfort, gingival pain or bleeding RESPIRATORY: no cough, SOB, DOE, wheezing, hemoptysis CARDIAC: No chest pain or palpitations GI: No abdominal pain, diarrhea, constipation, heart burn, nausea or vomiting GU: Denies dysuria, frequency, hematuria, incontinence, or discharge NEUROLOGICAL: Denies dizziness, syncope, numbness, or headache PSYCHIATRIC: Denies feelings of depression or anxiety. No report of hallucinations, insomnia, paranoia, or agitation   Immunization History  Administered Date(s) Administered   Influenza, Quadrivalent, Recombinant, Inj, Pf 08/21/2019, 08/12/2020   Janssen (J&J) SARS-COV-2 Vaccination 02/25/2020, 09/29/2020   Pneumococcal-Unspecified 07/22/2014   Zoster, Live 07/22/2014   Pertinent  Health Maintenance Due  Topic Date Due   FOOT EXAM  Never done   OPHTHALMOLOGY EXAM  Never done   URINE MICROALBUMIN  Never done   COLONOSCOPY (Pts 45-62yrs Insurance coverage will need to be confirmed)  Never done   MAMMOGRAM  Never done   DEXA SCAN  Never done   PNA vac Low Risk Adult (1 of 2 - PCV13) 02/28/2019   INFLUENZA VACCINE  06/21/2021   HEMOGLOBIN A1C  11/09/2021   No flowsheet data found.   Vitals:   05/31/21 1545  BP: 136/85  Pulse: 82  Resp: 18  Temp: (!) 97.5 F (36.4 C)  Weight: 169 lb 9.6 oz (76.9 kg)  Height: 5\' 6"  (1.676 m)   Body mass index is 27.37 kg/m.  Physical Exam  GENERAL APPEARANCE: Well nourished. In no acute distress.  SKIN:  Skin is warm and dry.  MOUTH and THROAT: Lips are without lesions. Oral mucosa is moist and without lesions. Tongue is normal in shape, size, and color and without lesions RESPIRATORY: Breathing is even & unlabored, BS CTAB CARDIAC: RRR, no murmur,no extra heart sounds, BLE 2+ edema GI: Abdomen soft, normal BS, no masses, no tenderness EXTREMITIES:  Able to move X 4  extremities. NEUROLOGICAL: There is no tremor. Speech is clear. Alert and oriented X 3. PSYCHIATRIC:  Affect and behavior are appropriate  Labs reviewed: Recent Labs    01/28/21 0221 01/29/21 0217 01/30/21 0208 02/08/21 0000 05/12/21 0119 05/13/21 0213 05/14/21 0109 05/31/21 0000  NA 136 134* 134*   < > 142 141 137 140  K 3.5 4.2 3.8   < > 2.9* 3.4* 3.3* 3.4  CL 101 102 101   < > 107 107 106 100  CO2 26 25 26    < > 22 25 21* 30*  GLUCOSE 98 105* 119*   < > 190* 135* 120*  --   BUN 5* 7* 6*   < > 96* 59* 39* 8  CREATININE 0.55 0.68 0.66   < > 1.70* 1.05* 0.93 0.6  CALCIUM 8.8* 8.9 9.2   < > 8.2* 8.2* 8.1* 8.9  MG 1.6* 1.7 1.8  --   --   --   --   --   PHOS  --   --   --    < > 3.1 1.3* 1.4*  --    < > = values in  this interval not displayed.   Recent Labs    01/27/21 0229 05/10/21 1020 05/11/21 0142 05/12/21 0119 05/13/21 0213 05/14/21 0109  AST 115* 23 27  --   --   --   ALT 107* 7 14  --   --   --   ALKPHOS 59 61 57  --   --   --   BILITOT 0.5 1.5* 1.3*  --   --   --   PROT 5.1* 6.5 6.0*  --   --   --   ALBUMIN 2.7* 3.1* 2.8* 2.8* 2.9* 2.8*   Recent Labs    01/24/21 0049 01/25/21 0545 01/30/21 0208 05/10/21 1020 05/10/21 1031 05/11/21 0142 05/13/21 0213  WBC 8.3 6.9   < > 8.6  --  11.3* 7.2  NEUTROABS 5.6 4.3  --  6.4  --   --   --   HGB 11.2* 12.0   < > 15.8* 15.6* 16.9* 14.6  HCT 31.0* 34.3*   < > 46.2* 46.0 46.3* 43.8  MCV 92.5 92.5   < > 93.0  --  88.9 94.6  PLT 134* 160   < > 223  --  127* 155   < > = values in this interval not displayed.   Lab Results  Component Value Date   TSH 12.326 (H) 05/10/2021   Lab Results  Component Value Date   HGBA1C 5.4 05/10/2021   No results found for: CHOL, HDL, LDLCALC, LDLDIRECT, TRIG, CHOLHDL  Significant Diagnostic Results in last 30 days:  DG Abd 1 View  Result Date: 05/10/2021 CLINICAL DATA:  Abdominal pain.  Hypotension. ED notes: patient has a medical history significant of hypertension,  hyperlipidemia, hypothyroidism, and diabetes mellitus type 2 presents after being found on her laying on the couch by her friend and former coworker this morning confused and with slurred speech. History is mostly obtained from the patient's friend who found her this morning. Apparently family had last spoken to the patient 3 days ago. At that time she complained of feeling under the weather and did not feel like eating or drinking. EXAM: ABDOMEN - 1 VIEW COMPARISON:  None. FINDINGS: Dilated loops of small bowel are noted in the central abdomen. Scattered air seen within a nondistended colon. Aortoiliac and femoral artery atherosclerotic calcifications. Soft tissues otherwise unremarkable. IMPRESSION: 1. Dilated small bowel loops consistent with partial small bowel obstruction versus a small bowel adynamic ileus. Electronically Signed   By: Amie Portland M.D.   On: 05/10/2021 17:44   CT HEAD WO CONTRAST  Result Date: 05/10/2021 CLINICAL DATA:  65 year old female with weakness and confusion for 5 days. EXAM: CT HEAD WITHOUT CONTRAST TECHNIQUE: Contiguous axial images were obtained from the base of the skull through the vertex without intravenous contrast. COMPARISON:  01/21/2021 CT FINDINGS: Brain: No evidence of acute infarction, hemorrhage, hydrocephalus, extra-axial collection or mass lesion/mass effect. Vascular: Carotid atherosclerotic calcifications are noted. Skull: Normal. Negative for fracture or focal lesion. Sinuses/Orbits: No acute finding. Other: None. IMPRESSION: No evidence of acute intracranial abnormality. Electronically Signed   By: Harmon Pier M.D.   On: 05/10/2021 15:28   US RENAL  Result Date: 05/10/2021 CLINICAL DATA:  Acute renal failure. EXAM: RENAL / URINARY TRACT ULTRASOUND COMPLETE COMPARISON:  None. FINDINGS: Right Kidney: Renal measurements: 11.5 x 4.4 x 5.8 cm = volume: 150.9 mL. Normal parenchymal echogenicity. Upper pole cyst measuring 2.1 x 2.1 x 2.5 cm. No other masses, no  stones and no hydronephrosis. Left Kidney: Renal  measurements: 12.5 x 6.9 x 5.6 cm = volume: 254 mL. Normal parenchymal echogenicity. Assessment somewhat limited due to a poor acoustic window. No convincing mass or stone. No hydronephrosis. Bladder: Bladder distended.  No wall thickening, mass or stone. Other: None. IMPRESSION: 1. No acute findings.  No hydronephrosis. 2. 2.5 cm cyst arises from the upper pole the right kidney. No other renal abnormalities. 3. Distended bladder. Electronically Signed   By: Amie Portlandavid  Ormond M.D.   On: 05/10/2021 17:33   DG Chest Port 1 View  Result Date: 05/10/2021 CLINICAL DATA:  Questionable sepsis EXAM: PORTABLE CHEST 1 VIEW COMPARISON:  01/21/2021 FINDINGS: The heart size and mediastinal contours are within normal limits. Both lungs are clear. Nonacute, callused fractures of the posterolateral left ribs. IMPRESSION: 1. No acute abnormality of the lungs in AP portable projection. 2. Nonacute, callused fractures of the posterolateral left ribs. Electronically Signed   By: Lauralyn PrimesAlex  Bibbey M.D.   On: 05/10/2021 11:42   DG Abd Portable 1V  Result Date: 05/11/2021 CLINICAL DATA:  8 hour delayed image EXAM: PORTABLE ABDOMEN - 1 VIEW COMPARISON:  CT 01/21/2021, radiograph 05/11/2021, 620 2022 FINDINGS: Enteral contrast visible within the stomach and small bowel. Diffuse dilatation of multiple small bowel loops up to 4.2 cm. Probable small amount of contrast within the right colon IMPRESSION: 1. Contrast within stomach and diffusely dilated small bowel with possible small amount of contrast in the right colon. Electronically Signed   By: Jasmine PangKim  Fujinaga M.D.   On: 05/11/2021 23:12   DG Abd Portable 1V  Result Date: 05/11/2021 CLINICAL DATA:  Partial small bowel obstruction EXAM: PORTABLE ABDOMEN - 1 VIEW COMPARISON:  Portable exam 0736 hours compared to 05/10/2021 FINDINGS: Air-filled dilated small bowel loops in the mid abdomen again seen. Paucity of colonic gas. No bowel wall  thickening. Osseous structures stable. No urinary tract calcification. IMPRESSION: Persistent small bowel distension. Electronically Signed   By: Ulyses SouthwardMark  Boles M.D.   On: 05/11/2021 10:22    Assessment/Plan  1. Acute renal failure, unspecified acute renal failure type (HCC)   creatinine 0.62, GFR >90 , 05/31/21 -  resolved  2. Hypophosphatemia - phosphorus (K PHOS NEUTRAL) 155-852-130 MG tablet; Take 2 tablets (500 mg total) by mouth 2 (two) times daily.  Dispense: 120 tablet; Refill: 0  3. Bilateral lower extremity edema -  continue bilateral TED stockings, knee high, on in am and off at HS - furosemide (LASIX) 20 MG tablet; Take 1 tablet (20 mg total) by mouth daily for 2 days.  Dispense: 2 tablet; Refill: 0  4. Postoperative hypothyroidism - levothyroxine (SYNTHROID) 100 MCG tablet; Take 1 tablet (100 mcg total) by mouth daily before breakfast.  Dispense: 30 tablet; Refill: 0  5. SVT (supraventricular tachycardia) (HCC) - diltiazem (CARDIZEM CD) 180 MG 24 hr capsule; Take 1 capsule (180 mg total) by mouth daily.  Dispense: 30 capsule; Refill: 0  6. Pure hypercholesterolemia - rosuvastatin (CRESTOR) 20 MG tablet; Take 1 tablet (20 mg total) by mouth daily.  Dispense: 30 tablet; Refill: 0      I have filled out patient's discharge paperwork and e-prescribed medications.  Patient will receive home health PT and OT.  DME provided: None  Total discharge time: Greater than 30 minutes Greater than 50% was spent in counseling and coordination of care.    Discharge time involved coordination of the discharge process with social worker, nursing staff and therapy department. Medical justification for home health services verified.    Kristina Gates Medina-Vargas, DNP,  MSN, FNP-BC [facility] [phone] (Monday-Friday 8:00 a.m. - 5:00 p.m.) [phone] (after hours)

## 2021-06-01 MED ORDER — K PHOS MONO-SOD PHOS DI & MONO 155-852-130 MG PO TABS: 500.0000 mg | ORAL_TABLET | Freq: Two times a day (BID) | ORAL | 0 refills | Status: DC

## 2021-06-01 MED ORDER — ROSUVASTATIN CALCIUM 20 MG PO TABS: 20.0000 mg | ORAL_TABLET | Freq: Every day | ORAL | 0 refills | Status: AC

## 2021-06-01 MED ORDER — DILTIAZEM HCL ER COATED BEADS 180 MG PO CP24: 180.0000 mg | ORAL_CAPSULE | Freq: Every day | ORAL | 0 refills | Status: DC

## 2021-06-01 MED ORDER — FUROSEMIDE 20 MG PO TABS: 20.0000 mg | ORAL_TABLET | Freq: Every day | ORAL | 0 refills | Status: DC

## 2021-06-01 MED ORDER — LEVOTHYROXINE SODIUM 100 MCG PO TABS: 100.0000 ug | ORAL_TABLET | Freq: Every day | ORAL | 0 refills | Status: AC

## 2021-06-02 DIAGNOSIS — E7800 Pure hypercholesterolemia, unspecified: Secondary | ICD-10-CM | POA: Diagnosis not present

## 2021-06-02 DIAGNOSIS — E1165 Type 2 diabetes mellitus with hyperglycemia: Secondary | ICD-10-CM | POA: Diagnosis not present

## 2021-06-02 DIAGNOSIS — I10 Essential (primary) hypertension: Secondary | ICD-10-CM | POA: Diagnosis not present

## 2021-06-02 DIAGNOSIS — E890 Postprocedural hypothyroidism: Secondary | ICD-10-CM | POA: Diagnosis not present

## 2021-06-04 DIAGNOSIS — E872 Acidosis: Secondary | ICD-10-CM | POA: Diagnosis not present

## 2021-06-04 DIAGNOSIS — M47816 Spondylosis without myelopathy or radiculopathy, lumbar region: Secondary | ICD-10-CM | POA: Diagnosis not present

## 2021-06-04 DIAGNOSIS — I2510 Atherosclerotic heart disease of native coronary artery without angina pectoris: Secondary | ICD-10-CM | POA: Diagnosis not present

## 2021-06-04 DIAGNOSIS — M47812 Spondylosis without myelopathy or radiculopathy, cervical region: Secondary | ICD-10-CM | POA: Diagnosis not present

## 2021-06-04 DIAGNOSIS — I10 Essential (primary) hypertension: Secondary | ICD-10-CM | POA: Diagnosis not present

## 2021-06-04 DIAGNOSIS — I471 Supraventricular tachycardia: Secondary | ICD-10-CM | POA: Diagnosis not present

## 2021-06-04 DIAGNOSIS — G9340 Encephalopathy, unspecified: Secondary | ICD-10-CM | POA: Diagnosis not present

## 2021-06-04 DIAGNOSIS — E119 Type 2 diabetes mellitus without complications: Secondary | ICD-10-CM | POA: Diagnosis not present

## 2021-06-04 DIAGNOSIS — S2242XD Multiple fractures of ribs, left side, subsequent encounter for fracture with routine healing: Secondary | ICD-10-CM | POA: Diagnosis not present

## 2021-06-09 DIAGNOSIS — G9340 Encephalopathy, unspecified: Secondary | ICD-10-CM | POA: Diagnosis not present

## 2021-06-09 DIAGNOSIS — E119 Type 2 diabetes mellitus without complications: Secondary | ICD-10-CM | POA: Diagnosis not present

## 2021-06-09 DIAGNOSIS — E872 Acidosis: Secondary | ICD-10-CM | POA: Diagnosis not present

## 2021-06-09 DIAGNOSIS — M47816 Spondylosis without myelopathy or radiculopathy, lumbar region: Secondary | ICD-10-CM | POA: Diagnosis not present

## 2021-06-09 DIAGNOSIS — I10 Essential (primary) hypertension: Secondary | ICD-10-CM | POA: Diagnosis not present

## 2021-06-09 DIAGNOSIS — I471 Supraventricular tachycardia: Secondary | ICD-10-CM | POA: Diagnosis not present

## 2021-06-09 DIAGNOSIS — S2242XD Multiple fractures of ribs, left side, subsequent encounter for fracture with routine healing: Secondary | ICD-10-CM | POA: Diagnosis not present

## 2021-06-09 DIAGNOSIS — M47812 Spondylosis without myelopathy or radiculopathy, cervical region: Secondary | ICD-10-CM | POA: Diagnosis not present

## 2021-06-09 DIAGNOSIS — I2510 Atherosclerotic heart disease of native coronary artery without angina pectoris: Secondary | ICD-10-CM | POA: Diagnosis not present

## 2021-06-10 DIAGNOSIS — S2242XD Multiple fractures of ribs, left side, subsequent encounter for fracture with routine healing: Secondary | ICD-10-CM | POA: Diagnosis not present

## 2021-06-10 DIAGNOSIS — I471 Supraventricular tachycardia: Secondary | ICD-10-CM | POA: Diagnosis not present

## 2021-06-10 DIAGNOSIS — G9340 Encephalopathy, unspecified: Secondary | ICD-10-CM | POA: Diagnosis not present

## 2021-06-10 DIAGNOSIS — I10 Essential (primary) hypertension: Secondary | ICD-10-CM | POA: Diagnosis not present

## 2021-06-10 DIAGNOSIS — M47812 Spondylosis without myelopathy or radiculopathy, cervical region: Secondary | ICD-10-CM | POA: Diagnosis not present

## 2021-06-10 DIAGNOSIS — I2510 Atherosclerotic heart disease of native coronary artery without angina pectoris: Secondary | ICD-10-CM | POA: Diagnosis not present

## 2021-06-10 DIAGNOSIS — E119 Type 2 diabetes mellitus without complications: Secondary | ICD-10-CM | POA: Diagnosis not present

## 2021-06-10 DIAGNOSIS — E872 Acidosis: Secondary | ICD-10-CM | POA: Diagnosis not present

## 2021-06-10 DIAGNOSIS — M47816 Spondylosis without myelopathy or radiculopathy, lumbar region: Secondary | ICD-10-CM | POA: Diagnosis not present

## 2021-06-15 NOTE — Progress Notes (Deleted)
PCP:  Dorisann Frames, MD Primary Cardiologist: None Electrophysiologist: Hillis Range, MD   Kristina Gates is a 65 y.o. female seen today for Hillis Range, MD for routine electrophysiology followup.  Since last being seen in our clinic the patient reports doing ***.  she denies chest pain, palpitations, dyspnea, PND, orthopnea, nausea, vomiting, dizziness, syncope, edema, weight gain, or early satiety.  Past Medical History:  Diagnosis Date   Diabetes mellitus with renal complications (HCC) 2022   AKI in context of rhabdo   Hypercholesteremia    Hypertension    Postprocedural hypothyroidism    Past Surgical History:  Procedure Laterality Date   COLON SURGERY      Current Outpatient Medications  Medication Sig Dispense Refill   acetaminophen (TYLENOL) 500 MG tablet Take 500-1,000 mg by mouth every 6 (six) hours as needed for headache (pain).     bisacodyl (BISCOLAX) 10 MG suppository Place 10 mg rectally as needed. If not relieved by MOM, 1 dose in 24 hours as needed     diltiazem (CARDIZEM CD) 180 MG 24 hr capsule Take 1 capsule (180 mg total) by mouth daily. 30 capsule 0   docusate sodium (COLACE) 100 MG capsule Take 1 capsule (100 mg total) by mouth 2 (two) times daily as needed (constipation).     furosemide (LASIX) 20 MG tablet Take 1 tablet (20 mg total) by mouth daily for 2 days. 2 tablet 0   levothyroxine (SYNTHROID) 100 MCG tablet Take 1 tablet (100 mcg total) by mouth daily before breakfast. 30 tablet 0   Multiple Vitamin (MULTIVITAMIN WITH MINERALS) TABS tablet Take 1 tablet by mouth daily with lunch.     NON FORMULARY Diet:Heart Healthy/CCD     phosphorus (K PHOS NEUTRAL) 155-852-130 MG tablet Take 2 tablets (500 mg total) by mouth 2 (two) times daily. 120 tablet 0   rosuvastatin (CRESTOR) 20 MG tablet Take 1 tablet (20 mg total) by mouth daily. 30 tablet 0   Sodium Phosphates (RA SALINE ENEMA RE) Place rectally. If not relieved by Biscodyl suppository,1 dose/24 hrs as  needed     No current facility-administered medications for this visit.    Allergies  Allergen Reactions   Metformin And Related Cough   Zestril [Lisinopril] Cough    Social History   Socioeconomic History   Marital status: Married    Spouse name: Not on file   Number of children: Not on file   Years of education: Not on file   Highest education level: Not on file  Occupational History   Not on file  Tobacco Use   Smoking status: Some Days    Types: Cigarettes   Smokeless tobacco: Never  Vaping Use   Vaping Use: Never used  Substance and Sexual Activity   Alcohol use: Yes    Comment: occasional   Drug use: Never   Sexual activity: Not on file  Other Topics Concern   Not on file  Social History Narrative   Not on file   Social Determinants of Health   Financial Resource Strain: Not on file  Food Insecurity: Not on file  Transportation Needs: Not on file  Physical Activity: Not on file  Stress: Not on file  Social Connections: Not on file  Intimate Partner Violence: Not on file     Review of Systems: General: No chills, fever, night sweats or weight changes  Cardiovascular:  No chest pain, dyspnea on exertion, edema, orthopnea, palpitations, paroxysmal nocturnal dyspnea Dermatological: No rash, lesions or  masses Respiratory: No cough, dyspnea Urologic: No hematuria, dysuria Abdominal: No nausea, vomiting, diarrhea, bright red blood per rectum, melena, or hematemesis Neurologic: No visual changes, weakness, changes in mental status All other systems reviewed and are otherwise negative except as noted above.  Physical Exam: There were no vitals filed for this visit.  GEN- The patient is well appearing, alert and oriented x 3 today.   HEENT: normocephalic, atraumatic; sclera clear, conjunctiva pink; hearing intact; oropharynx clear; neck supple, no JVP Lymph- no cervical lymphadenopathy Lungs- Clear to ausculation bilaterally, normal work of breathing.  No  wheezes, rales, rhonchi Heart- Regular rate and rhythm, no murmurs, rubs or gallops, PMI not laterally displaced GI- soft, non-tender, non-distended, bowel sounds present, no hepatosplenomegaly Extremities- no clubbing, cyanosis, or edema; DP/PT/radial pulses 2+ bilaterally MS- no significant deformity or atrophy Skin- warm and dry, no rash or lesion Psych- euthymic mood, full affect Neuro- strength and sensation are intact  EKG is not ordered. Personal review of EKG from  05/10/21  shows NSR at 65 bpm  Additional studies reviewed include: ***  Assessment and Plan:  1.  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 Continue diltiazem 180 mg daily. Move to bedtime. Can use short acting prn as well if needed. EF normal 01/2021 Initial episodes and EP consult was in setting of acute illness, though chronic component noted (she reports infrequent and relatively un-bothersome) If not controlled on medications and outside of the setting of infection, she may be candidate for ablation in the future. She would like to avoid this if possible.   2. HTN Continue current regimen.   Graciella Freer, PA-C  06/15/21 1:31 PM

## 2021-06-16 ENCOUNTER — Ambulatory Visit: Payer: Medicare HMO | Admitting: Student

## 2021-06-16 DIAGNOSIS — I471 Supraventricular tachycardia: Secondary | ICD-10-CM

## 2021-06-16 DIAGNOSIS — I10 Essential (primary) hypertension: Secondary | ICD-10-CM

## 2021-06-23 DIAGNOSIS — E890 Postprocedural hypothyroidism: Secondary | ICD-10-CM | POA: Diagnosis not present

## 2021-06-23 DIAGNOSIS — E1165 Type 2 diabetes mellitus with hyperglycemia: Secondary | ICD-10-CM | POA: Diagnosis not present

## 2021-06-23 DIAGNOSIS — I10 Essential (primary) hypertension: Secondary | ICD-10-CM | POA: Diagnosis not present

## 2021-06-24 DIAGNOSIS — G9340 Encephalopathy, unspecified: Secondary | ICD-10-CM | POA: Diagnosis not present

## 2021-06-24 DIAGNOSIS — I471 Supraventricular tachycardia: Secondary | ICD-10-CM | POA: Diagnosis not present

## 2021-06-24 DIAGNOSIS — I10 Essential (primary) hypertension: Secondary | ICD-10-CM | POA: Diagnosis not present

## 2021-06-24 DIAGNOSIS — E119 Type 2 diabetes mellitus without complications: Secondary | ICD-10-CM | POA: Diagnosis not present

## 2021-06-24 DIAGNOSIS — M47816 Spondylosis without myelopathy or radiculopathy, lumbar region: Secondary | ICD-10-CM | POA: Diagnosis not present

## 2021-06-24 DIAGNOSIS — S2242XD Multiple fractures of ribs, left side, subsequent encounter for fracture with routine healing: Secondary | ICD-10-CM | POA: Diagnosis not present

## 2021-06-24 DIAGNOSIS — I2510 Atherosclerotic heart disease of native coronary artery without angina pectoris: Secondary | ICD-10-CM | POA: Diagnosis not present

## 2021-06-24 DIAGNOSIS — E872 Acidosis: Secondary | ICD-10-CM | POA: Diagnosis not present

## 2021-06-24 DIAGNOSIS — M47812 Spondylosis without myelopathy or radiculopathy, cervical region: Secondary | ICD-10-CM | POA: Diagnosis not present

## 2021-06-28 DIAGNOSIS — E039 Hypothyroidism, unspecified: Secondary | ICD-10-CM | POA: Diagnosis not present

## 2021-06-28 DIAGNOSIS — R002 Palpitations: Secondary | ICD-10-CM | POA: Diagnosis not present

## 2021-06-28 DIAGNOSIS — I10 Essential (primary) hypertension: Secondary | ICD-10-CM | POA: Diagnosis not present

## 2021-06-28 DIAGNOSIS — E785 Hyperlipidemia, unspecified: Secondary | ICD-10-CM | POA: Diagnosis not present

## 2021-06-28 DIAGNOSIS — Z720 Tobacco use: Secondary | ICD-10-CM | POA: Diagnosis not present

## 2021-07-07 DIAGNOSIS — E1165 Type 2 diabetes mellitus with hyperglycemia: Secondary | ICD-10-CM | POA: Diagnosis not present

## 2021-07-07 DIAGNOSIS — I10 Essential (primary) hypertension: Secondary | ICD-10-CM | POA: Diagnosis not present

## 2021-07-07 DIAGNOSIS — E7800 Pure hypercholesterolemia, unspecified: Secondary | ICD-10-CM | POA: Diagnosis not present

## 2021-07-07 DIAGNOSIS — E890 Postprocedural hypothyroidism: Secondary | ICD-10-CM | POA: Diagnosis not present

## 2021-07-08 DIAGNOSIS — G9340 Encephalopathy, unspecified: Secondary | ICD-10-CM | POA: Diagnosis not present

## 2021-07-08 DIAGNOSIS — M47816 Spondylosis without myelopathy or radiculopathy, lumbar region: Secondary | ICD-10-CM | POA: Diagnosis not present

## 2021-07-08 DIAGNOSIS — I10 Essential (primary) hypertension: Secondary | ICD-10-CM | POA: Diagnosis not present

## 2021-07-08 DIAGNOSIS — M47812 Spondylosis without myelopathy or radiculopathy, cervical region: Secondary | ICD-10-CM | POA: Diagnosis not present

## 2021-07-08 DIAGNOSIS — I471 Supraventricular tachycardia: Secondary | ICD-10-CM | POA: Diagnosis not present

## 2021-07-08 DIAGNOSIS — E119 Type 2 diabetes mellitus without complications: Secondary | ICD-10-CM | POA: Diagnosis not present

## 2021-07-08 DIAGNOSIS — S2242XD Multiple fractures of ribs, left side, subsequent encounter for fracture with routine healing: Secondary | ICD-10-CM | POA: Diagnosis not present

## 2021-07-08 DIAGNOSIS — I2510 Atherosclerotic heart disease of native coronary artery without angina pectoris: Secondary | ICD-10-CM | POA: Diagnosis not present

## 2021-07-08 DIAGNOSIS — E872 Acidosis: Secondary | ICD-10-CM | POA: Diagnosis not present

## 2021-07-11 NOTE — Progress Notes (Signed)
PCP:  Dorisann Frames, MD Primary Cardiologist: None Electrophysiologist: Hillis Range, MD   Kristina Gates is a 65 y.o. female seen today for Hillis Range, MD for routine electrophysiology followup.  Since last being seen in our clinic the patient reports doing OK.   Admitted 6/20 - 05/14/21 with hypovolemic shock with ARF and Cr 9.18 and BUN 186. She improved with IV fluids, empiric ABx, and bicarb through nephrology. ABX stopped when Cx negative. Remain function continued to improved dramatically even after d/c of IVF. Discharged to SNF  Overall has been doing well. Has brief intermittent palpitations, as often as daily. Only last 2-5 minutes at a time. Review of CV strips and EKG from recent admission show NSR and PVCs/PACs, this in the setting of marked metabolic abnormalities. Currentl, she denies chest pain, undue dyspnea, PND, orthopnea, nausea, vomiting, syncope, edema, weight gain, or early satiety. She did have mild lightheadedness yesterday am after   Past Medical History:  Diagnosis Date   Diabetes mellitus with renal complications (HCC) 2022   AKI in context of rhabdo   Hypercholesteremia    Hypertension    Postprocedural hypothyroidism    Past Surgical History:  Procedure Laterality Date   COLON SURGERY      Current Outpatient Medications  Medication Sig Dispense Refill   acetaminophen (TYLENOL) 500 MG tablet Take 500-1,000 mg by mouth every 6 (six) hours as needed for headache (pain).     diltiazem (CARDIZEM CD) 180 MG 24 hr capsule Take 1 capsule (180 mg total) by mouth daily. 30 capsule 0   levothyroxine (SYNTHROID) 100 MCG tablet Take 1 tablet (100 mcg total) by mouth daily before breakfast. 30 tablet 0   losartan (COZAAR) 100 MG tablet 50 mg daily.     Multiple Vitamin (MULTIVITAMIN WITH MINERALS) TABS tablet Take 1 tablet by mouth daily with lunch.     NON FORMULARY Diet:Heart Healthy/CCD     phosphorus (K PHOS NEUTRAL) 155-852-130 MG tablet Take 2 tablets (500  mg total) by mouth 2 (two) times daily. 120 tablet 0   rosuvastatin (CRESTOR) 20 MG tablet Take 1 tablet (20 mg total) by mouth daily. 30 tablet 0   No current facility-administered medications for this visit.    Allergies  Allergen Reactions   Metformin And Related Cough   Zestril [Lisinopril] Cough    Social History   Socioeconomic History   Marital status: Married    Spouse name: Not on file   Number of children: Not on file   Years of education: Not on file   Highest education level: Not on file  Occupational History   Not on file  Tobacco Use   Smoking status: Some Days    Types: Cigarettes   Smokeless tobacco: Never  Vaping Use   Vaping Use: Never used  Substance and Sexual Activity   Alcohol use: Yes    Comment: occasional   Drug use: Never   Sexual activity: Not on file  Other Topics Concern   Not on file  Social History Narrative   Not on file   Social Determinants of Health   Financial Resource Strain: Not on file  Food Insecurity: Not on file  Transportation Needs: Not on file  Physical Activity: Not on file  Stress: Not on file  Social Connections: Not on file  Intimate Partner Violence: Not on file   Review of Systems: All other systems reviewed and are otherwise negative except as noted above.  Physical Exam: Vitals:  07/13/21 1007  BP: 130/68  Pulse: 96  SpO2: 97%  Weight: 153 lb (69.4 kg)  Height: 5\' 4"  (1.626 m)    GEN- The patient is well appearing, alert and oriented x 3 today.   HEENT: normocephalic, atraumatic; sclera clear, conjunctiva pink; hearing intact; oropharynx clear; neck supple, no JVP Lymph- no cervical lymphadenopathy Lungs- Clear to ausculation bilaterally, normal work of breathing.  No wheezes, rales, rhonchi Heart- Regular rate and rhythm, no murmurs, rubs or gallops, PMI not laterally displaced GI- soft, non-tender, non-distended, bowel sounds present, no hepatosplenomegaly Extremities- no clubbing, cyanosis, or  edema; DP/PT/radial pulses 2+ bilaterally MS- no significant deformity or atrophy Skin- warm and dry, no rash or lesion Psych- euthymic mood, full affect Neuro- strength and sensation are intact  EKG is not ordered.   Additional studies reviewed include: Previous EP office notes.   Assessment and Plan:  1.  SVT  With psuedo R' after QRS, cannot rule out AVNRT Increase diltiazem to 240 mg daily.  EF normal If not controlled on medications and outside of the setting of infection, she may be candidate for ablation in the future. She would like to avoid this if possible.  Encouraged hydration as this has been a clear trigger for her in the past.   2. HTN Controlled on current regimen.  .   Labs today with recent low K and dehydration. RTC 6 months, sooner with symptoms.   , PA-C  07/13/21 10:16 AM

## 2021-07-13 ENCOUNTER — Other Ambulatory Visit: Payer: Self-pay | Admitting: Adult Health

## 2021-07-13 ENCOUNTER — Encounter: Payer: Self-pay | Admitting: Student

## 2021-07-13 ENCOUNTER — Ambulatory Visit: Payer: Medicare HMO | Admitting: Student

## 2021-07-13 ENCOUNTER — Other Ambulatory Visit: Payer: Self-pay

## 2021-07-13 VITALS — BP 130/68 | HR 96 | Ht 64.0 in | Wt 153.0 lb

## 2021-07-13 DIAGNOSIS — I10 Essential (primary) hypertension: Secondary | ICD-10-CM

## 2021-07-13 DIAGNOSIS — I471 Supraventricular tachycardia: Secondary | ICD-10-CM | POA: Diagnosis not present

## 2021-07-13 DIAGNOSIS — E890 Postprocedural hypothyroidism: Secondary | ICD-10-CM

## 2021-07-13 LAB — COMPREHENSIVE METABOLIC PANEL
ALT: 13 IU/L (ref 0–32)
AST: 20 IU/L (ref 0–40)
Albumin/Globulin Ratio: 1.9 (ref 1.2–2.2)
Albumin: 4.7 g/dL (ref 3.8–4.8)
Alkaline Phosphatase: 113 IU/L (ref 44–121)
BUN/Creatinine Ratio: 13 (ref 12–28)
BUN: 8 mg/dL (ref 8–27)
Bilirubin Total: 0.5 mg/dL (ref 0.0–1.2)
CO2: 24 mmol/L (ref 20–29)
Calcium: 10 mg/dL (ref 8.7–10.3)
Chloride: 101 mmol/L (ref 96–106)
Creatinine, Ser: 0.62 mg/dL (ref 0.57–1.00)
Globulin, Total: 2.5 g/dL (ref 1.5–4.5)
Glucose: 133 mg/dL — ABNORMAL HIGH (ref 65–99)
Potassium: 4 mmol/L (ref 3.5–5.2)
Sodium: 140 mmol/L (ref 134–144)
Total Protein: 7.2 g/dL (ref 6.0–8.5)
eGFR: 98 mL/min/{1.73_m2} (ref >59)

## 2021-07-13 LAB — MAGNESIUM: Magnesium: 1.8 mg/dL (ref 1.6–2.3)

## 2021-07-13 MED ORDER — DILTIAZEM HCL ER COATED BEADS 240 MG PO CP24: 240.0000 mg | ORAL_CAPSULE | Freq: Every day | ORAL | 3 refills | Status: DC

## 2021-07-13 NOTE — Patient Instructions (Signed)
Medication Instructions:  Your physician has recommended you make the following change in your medication:   INCREASE: Diltiazem to 240mg  daily  *If you need a refill on your cardiac medications before your next appointment, please call your pharmacy*   Lab Work: TODAY: CMET, Mg  If you have labs (blood work) drawn today and your tests are completely normal, you will receive your results only by: MyChart Message (if you have MyChart) OR A paper copy in the mail If you have any lab test that is abnormal or we need to change your treatment, we will call you to review the results.    Follow-Up: At [facility], you and your health needs are our priority.  As part of our continuing mission to provide you with exceptional heart care, we have created designated Provider Care Teams.  These Care Teams include your primary Cardiologist (physician) and Advanced Practice Providers (APPs -  Physician Assistants and Nurse Practitioners) who all work together to provide you with the care you need, when you need it.  We recommend signing up for the patient portal called "MyChart".  Sign up information is provided on this After Visit Summary.  MyChart is used to connect with patients for Virtual Visits (Telemedicine).  Patients are able to view lab/test results, encounter notes, upcoming appointments, etc.  Non-urgent messages can be sent to your provider as well.   To learn more about what you can do with MyChart, go to [facility].    Your next appointment:   6 month(s)  The format for your next appointment:   In Person  Provider:   [email or web address] "Casimiro Needle, PA-C

## 2021-07-22 DIAGNOSIS — E872 Acidosis: Secondary | ICD-10-CM | POA: Diagnosis not present

## 2021-07-22 DIAGNOSIS — I2510 Atherosclerotic heart disease of native coronary artery without angina pectoris: Secondary | ICD-10-CM | POA: Diagnosis not present

## 2021-07-22 DIAGNOSIS — I471 Supraventricular tachycardia: Secondary | ICD-10-CM | POA: Diagnosis not present

## 2021-07-22 DIAGNOSIS — G9340 Encephalopathy, unspecified: Secondary | ICD-10-CM | POA: Diagnosis not present

## 2021-07-22 DIAGNOSIS — M47812 Spondylosis without myelopathy or radiculopathy, cervical region: Secondary | ICD-10-CM | POA: Diagnosis not present

## 2021-07-22 DIAGNOSIS — I10 Essential (primary) hypertension: Secondary | ICD-10-CM | POA: Diagnosis not present

## 2021-07-22 DIAGNOSIS — M47816 Spondylosis without myelopathy or radiculopathy, lumbar region: Secondary | ICD-10-CM | POA: Diagnosis not present

## 2021-07-22 DIAGNOSIS — S2242XD Multiple fractures of ribs, left side, subsequent encounter for fracture with routine healing: Secondary | ICD-10-CM | POA: Diagnosis not present

## 2021-07-22 DIAGNOSIS — E119 Type 2 diabetes mellitus without complications: Secondary | ICD-10-CM | POA: Diagnosis not present

## 2021-08-09 ENCOUNTER — Other Ambulatory Visit: Payer: Self-pay | Admitting: *Deleted

## 2021-08-09 DIAGNOSIS — F17210 Nicotine dependence, cigarettes, uncomplicated: Secondary | ICD-10-CM

## 2021-08-09 DIAGNOSIS — Z87891 Personal history of nicotine dependence: Secondary | ICD-10-CM

## 2021-08-25 ENCOUNTER — Telehealth: Payer: Medicare HMO | Admitting: Acute Care

## 2021-08-25 ENCOUNTER — Ambulatory Visit: Payer: Medicare HMO

## 2021-10-27 DIAGNOSIS — I10 Essential (primary) hypertension: Secondary | ICD-10-CM | POA: Diagnosis not present

## 2021-10-27 DIAGNOSIS — Z720 Tobacco use: Secondary | ICD-10-CM | POA: Diagnosis not present

## 2021-10-27 DIAGNOSIS — R634 Abnormal weight loss: Secondary | ICD-10-CM | POA: Diagnosis not present

## 2021-10-27 DIAGNOSIS — E039 Hypothyroidism, unspecified: Secondary | ICD-10-CM | POA: Diagnosis not present

## 2021-10-27 DIAGNOSIS — R42 Dizziness and giddiness: Secondary | ICD-10-CM | POA: Diagnosis not present

## 2021-10-27 DIAGNOSIS — E785 Hyperlipidemia, unspecified: Secondary | ICD-10-CM | POA: Diagnosis not present

## 2021-10-27 DIAGNOSIS — R7309 Other abnormal glucose: Secondary | ICD-10-CM | POA: Diagnosis not present

## 2021-10-27 DIAGNOSIS — Z0000 Encounter for general adult medical examination without abnormal findings: Secondary | ICD-10-CM | POA: Diagnosis not present

## 2022-01-03 ENCOUNTER — Telehealth: Payer: Self-pay | Admitting: Internal Medicine

## 2022-01-03 NOTE — Telephone Encounter (Signed)
Legrand Como, pharmD with [facility] returning call.

## 2022-01-03 NOTE — Telephone Encounter (Addendum)
Called and spoke with pt first. She reports Diltiazem has been making her "feel drunk" since she started taking it last summer (she first said she has only experienced it for last several months, then stated it has been ongoing since last summer) She would skip a day here or there" taking it. States she hasn't been able to drive much at all d/t symptoms. She does report that she stopped the medication in January.  Stopped from Jan 3rd-17th, restarted because felt like her heart was racing.   But she does report that symptoms stopped upon discontinuation of medication and began again once she restarted. Educated pt that going forward she should call ordering pre scriber before stopping/changing a medication on her own, rationale given. Dr. Kateri Plummer, PCP, has been refilling this for pt Pt aware will discuss further w/ [facility] pharmD that called in and then will review w/ MD. Patient verbalized understanding and agreeable to plan.    Left message to call back to [facility] pharmD

## 2022-01-03 NOTE — Telephone Encounter (Signed)
Pt c/o medication issue:  1. Name of Medication: diltiazem (CARDIZEM CD) 180 MG 24 hr capsule  2. How are you currently taking this medication (dosage and times per day)? 1 tablet daily   3. Are you having a reaction (difficulty breathing--STAT)? no  4. What is your medication issue? Casimiro Needle, clinical pharmacist with [facility], states the patient informed them the diltiazem makes her feel loopy and unstable when she takes it. He would like a call back to discuss any medication changes: [phone]

## 2022-01-03 NOTE — Telephone Encounter (Signed)
Followed up with [facility], informed that we have spoken with pt. Will send to Otilio Saber, PA assist to arrange follow up with him to further evaluate/discuss things. Patient verbalized understanding and agreeable to plan.

## 2022-01-04 ENCOUNTER — Other Ambulatory Visit: Payer: Self-pay

## 2022-01-04 DIAGNOSIS — E7800 Pure hypercholesterolemia, unspecified: Secondary | ICD-10-CM | POA: Diagnosis not present

## 2022-01-04 DIAGNOSIS — E890 Postprocedural hypothyroidism: Secondary | ICD-10-CM | POA: Diagnosis not present

## 2022-01-04 DIAGNOSIS — E1165 Type 2 diabetes mellitus with hyperglycemia: Secondary | ICD-10-CM | POA: Diagnosis not present

## 2022-01-04 MED ORDER — METOPROLOL SUCCINATE ER 25 MG PO TB24: 25.0000 mg | ORAL_TABLET | Freq: Every day | ORAL | 1 refills | Status: DC

## 2022-01-04 NOTE — Telephone Encounter (Signed)
I spoke with pt and she is willing to try the Toprol. I have sent this to the pharmacy for her with a note to d/c Diltiazem.  She did not take her Diltiazem today and will pick up the Toprol and start it tomorrow night.  I scheduled her f/u with AT for 02/03/22.

## 2022-01-06 DIAGNOSIS — I10 Essential (primary) hypertension: Secondary | ICD-10-CM | POA: Diagnosis not present

## 2022-01-06 DIAGNOSIS — E039 Hypothyroidism, unspecified: Secondary | ICD-10-CM | POA: Diagnosis not present

## 2022-01-06 DIAGNOSIS — E785 Hyperlipidemia, unspecified: Secondary | ICD-10-CM | POA: Diagnosis not present

## 2022-01-11 DIAGNOSIS — E1165 Type 2 diabetes mellitus with hyperglycemia: Secondary | ICD-10-CM | POA: Diagnosis not present

## 2022-01-11 DIAGNOSIS — E7800 Pure hypercholesterolemia, unspecified: Secondary | ICD-10-CM | POA: Diagnosis not present

## 2022-01-11 DIAGNOSIS — I10 Essential (primary) hypertension: Secondary | ICD-10-CM | POA: Diagnosis not present

## 2022-01-11 DIAGNOSIS — E890 Postprocedural hypothyroidism: Secondary | ICD-10-CM | POA: Diagnosis not present

## 2022-01-25 NOTE — Progress Notes (Unsigned)
PCP:  Dorisann Frames, MD Primary Cardiologist: None Electrophysiologist: Hillis Range, MD   Kristina Gates is a 66 y.o. female seen today for Hillis Range, MD for routine electrophysiology followup.  Since last being seen in our clinic the patient reports doing ***.  she denies chest pain, palpitations, dyspnea, PND, orthopnea, nausea, vomiting, dizziness, syncope, edema, weight gain, or early satiety.  Past Medical History:  Diagnosis Date   Diabetes mellitus with renal complications (HCC) 2022   AKI in context of rhabdo   Hypercholesteremia    Hypertension    Postprocedural hypothyroidism    Past Surgical History:  Procedure Laterality Date   COLON SURGERY      Current Outpatient Medications  Medication Sig Dispense Refill   acetaminophen (TYLENOL) 500 MG tablet Take 500-1,000 mg by mouth every 6 (six) hours as needed for headache (pain).     levothyroxine (SYNTHROID) 100 MCG tablet Take 1 tablet (100 mcg total) by mouth daily before breakfast. 30 tablet 0   losartan (COZAAR) 100 MG tablet 50 mg daily.     metoprolol succinate (TOPROL XL) 25 MG 24 hr tablet Take 1 tablet (25 mg total) by mouth daily. 90 tablet 1   Multiple Vitamin (MULTIVITAMIN WITH MINERALS) TABS tablet Take 1 tablet by mouth daily with lunch.     NON FORMULARY Diet:Heart Healthy/CCD     phosphorus (K PHOS NEUTRAL) [id number] MG tablet Take 2 tablets (500 mg total) by mouth 2 (two) times daily. 120 tablet 0   rosuvastatin (CRESTOR) 20 MG tablet Take 1 tablet (20 mg total) by mouth daily. 30 tablet 0   No current facility-administered medications for this visit.    Allergies  Allergen Reactions   Metformin And Related Cough   Zestril [Lisinopril] Cough    Social History   Socioeconomic History   Marital status: Single    Spouse name: Not on file   Number of children: Not on file   Years of education: Not on file   Highest education level: Not on file  Occupational History   Not on file   Tobacco Use   Smoking status: Some Days    Types: Cigarettes   Smokeless tobacco: Never  Vaping Use   Vaping Use: Never used  Substance and Sexual Activity   Alcohol use: Yes    Comment: occasional   Drug use: Never   Sexual activity: Not on file  Other Topics Concern   Not on file  Social History Narrative   Not on file   Social Determinants of Health   Financial Resource Strain: Not on file  Food Insecurity: Not on file  Transportation Needs: Not on file  Physical Activity: Not on file  Stress: Not on file  Social Connections: Not on file  Intimate Partner Violence: Not on file     Review of Systems: All other systems reviewed and are otherwise negative except as noted above.  Physical Exam: There were no vitals filed for this visit.  GEN- The patient is well appearing, alert and oriented x 3 today.   HEENT: normocephalic, atraumatic; sclera clear, conjunctiva pink; hearing intact; oropharynx clear; neck supple, no JVP Lymph- no cervical lymphadenopathy Lungs- Clear to ausculation bilaterally, normal work of breathing.  No wheezes, rales, rhonchi Heart- Regular rate and rhythm, no murmurs, rubs or gallops, PMI not laterally displaced GI- soft, non-tender, non-distended, bowel sounds present, no hepatosplenomegaly Extremities- no clubbing, cyanosis, or edema; DP/PT/radial pulses 2+ bilaterally MS- no significant deformity or atrophy Skin- warm  and dry, no rash or lesion Psych- euthymic mood, full affect Neuro- strength and sensation are intact  EKG is ordered. Personal review of EKG from today shows ***  Additional studies reviewed include: Previous EP office notes. 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  EF normal If not controlled on medications and outside of the setting of infection, she may be candidate for ablation in the future. She would like to avoid this if possible.   Encouraged hydration as this has been a clear trigger for her in the past.   2.HTN Stable on current regimen   Follow up with {Blank single:19197::"Dr. Allred","Dr. Amada Jupiter. Klein","Dr. Camnitz","Dr. Lambert","EP APP"} in {Blank single:19197::"2 weeks","4 weeks","3 months","6 months","12 months","as usual post gen change"}   Graciella Freer, PA-C  01/25/22 12:39 PM

## 2022-01-28 DIAGNOSIS — E039 Hypothyroidism, unspecified: Secondary | ICD-10-CM | POA: Diagnosis not present

## 2022-01-28 DIAGNOSIS — R7309 Other abnormal glucose: Secondary | ICD-10-CM | POA: Diagnosis not present

## 2022-01-28 DIAGNOSIS — I10 Essential (primary) hypertension: Secondary | ICD-10-CM | POA: Diagnosis not present

## 2022-01-28 DIAGNOSIS — E785 Hyperlipidemia, unspecified: Secondary | ICD-10-CM | POA: Diagnosis not present

## 2022-01-28 DIAGNOSIS — E1169 Type 2 diabetes mellitus with other specified complication: Secondary | ICD-10-CM | POA: Diagnosis not present

## 2022-01-28 DIAGNOSIS — I700 Atherosclerosis of aorta: Secondary | ICD-10-CM | POA: Diagnosis not present

## 2022-01-28 DIAGNOSIS — Z23 Encounter for immunization: Secondary | ICD-10-CM | POA: Diagnosis not present

## 2022-01-28 DIAGNOSIS — R221 Localized swelling, mass and lump, neck: Secondary | ICD-10-CM | POA: Diagnosis not present

## 2022-01-28 DIAGNOSIS — M7989 Other specified soft tissue disorders: Secondary | ICD-10-CM | POA: Diagnosis not present

## 2022-01-28 DIAGNOSIS — Z720 Tobacco use: Secondary | ICD-10-CM | POA: Diagnosis not present

## 2022-02-03 ENCOUNTER — Other Ambulatory Visit: Payer: Self-pay

## 2022-02-03 ENCOUNTER — Encounter: Payer: Self-pay | Admitting: Student

## 2022-02-03 ENCOUNTER — Ambulatory Visit: Payer: Medicare HMO | Admitting: Student

## 2022-02-03 VITALS — BP 104/58 | HR 73 | Ht 65.0 in | Wt 150.0 lb

## 2022-02-03 DIAGNOSIS — I10 Essential (primary) hypertension: Secondary | ICD-10-CM

## 2022-02-03 DIAGNOSIS — I471 Supraventricular tachycardia: Secondary | ICD-10-CM | POA: Diagnosis not present

## 2022-02-03 DIAGNOSIS — E1169 Type 2 diabetes mellitus with other specified complication: Secondary | ICD-10-CM | POA: Diagnosis not present

## 2022-02-03 DIAGNOSIS — E039 Hypothyroidism, unspecified: Secondary | ICD-10-CM | POA: Diagnosis not present

## 2022-03-17 DIAGNOSIS — H52223 Regular astigmatism, bilateral: Secondary | ICD-10-CM | POA: Diagnosis not present

## 2022-03-17 DIAGNOSIS — I10 Essential (primary) hypertension: Secondary | ICD-10-CM | POA: Diagnosis not present

## 2022-03-17 DIAGNOSIS — E1169 Type 2 diabetes mellitus with other specified complication: Secondary | ICD-10-CM | POA: Diagnosis not present

## 2022-03-17 DIAGNOSIS — Z0100 Encounter for examination of eyes and vision without abnormal findings: Secondary | ICD-10-CM | POA: Diagnosis not present

## 2022-03-17 DIAGNOSIS — E039 Hypothyroidism, unspecified: Secondary | ICD-10-CM | POA: Diagnosis not present

## 2022-05-08 ENCOUNTER — Emergency Department (HOSPITAL_COMMUNITY): Payer: Medicare HMO

## 2022-05-08 ENCOUNTER — Other Ambulatory Visit: Payer: Self-pay

## 2022-05-08 ENCOUNTER — Inpatient Hospital Stay (HOSPITAL_COMMUNITY)
Admission: EM | Admit: 2022-05-08 | Discharge: 2022-05-10 | DRG: 395 | Disposition: A | Discharge status: Home / Self Care | Payer: Medicare HMO | Attending: Internal Medicine | Admitting: Internal Medicine

## 2022-05-08 ENCOUNTER — Encounter (HOSPITAL_COMMUNITY): Payer: Self-pay

## 2022-05-08 DIAGNOSIS — K56609 Unspecified intestinal obstruction, unspecified as to partial versus complete obstruction: Secondary | ICD-10-CM | POA: Diagnosis not present

## 2022-05-08 DIAGNOSIS — Z833 Family history of diabetes mellitus: Secondary | ICD-10-CM

## 2022-05-08 DIAGNOSIS — I10 Essential (primary) hypertension: Secondary | ICD-10-CM | POA: Diagnosis present

## 2022-05-08 DIAGNOSIS — Z79890 Hormone replacement therapy (postmenopausal): Secondary | ICD-10-CM

## 2022-05-08 DIAGNOSIS — E11649 Type 2 diabetes mellitus with hypoglycemia without coma: Secondary | ICD-10-CM | POA: Diagnosis not present

## 2022-05-08 DIAGNOSIS — E1165 Type 2 diabetes mellitus with hyperglycemia: Secondary | ICD-10-CM | POA: Diagnosis not present

## 2022-05-08 DIAGNOSIS — E890 Postprocedural hypothyroidism: Secondary | ICD-10-CM | POA: Diagnosis present

## 2022-05-08 DIAGNOSIS — Z8249 Family history of ischemic heart disease and other diseases of the circulatory system: Secondary | ICD-10-CM | POA: Diagnosis not present

## 2022-05-08 DIAGNOSIS — D72829 Elevated white blood cell count, unspecified: Secondary | ICD-10-CM | POA: Diagnosis present

## 2022-05-08 DIAGNOSIS — E7800 Pure hypercholesterolemia, unspecified: Secondary | ICD-10-CM | POA: Diagnosis present

## 2022-05-08 DIAGNOSIS — Z90710 Acquired absence of both cervix and uterus: Secondary | ICD-10-CM

## 2022-05-08 DIAGNOSIS — Z888 Allergy status to other drugs, medicaments and biological substances status: Secondary | ICD-10-CM

## 2022-05-08 DIAGNOSIS — Z79899 Other long term (current) drug therapy: Secondary | ICD-10-CM

## 2022-05-08 DIAGNOSIS — K439 Ventral hernia without obstruction or gangrene: Secondary | ICD-10-CM | POA: Diagnosis present

## 2022-05-08 DIAGNOSIS — R109 Unspecified abdominal pain: Secondary | ICD-10-CM | POA: Diagnosis not present

## 2022-05-08 DIAGNOSIS — R42 Dizziness and giddiness: Secondary | ICD-10-CM | POA: Diagnosis not present

## 2022-05-08 DIAGNOSIS — Z823 Family history of stroke: Secondary | ICD-10-CM | POA: Diagnosis not present

## 2022-05-08 DIAGNOSIS — I739 Peripheral vascular disease, unspecified: Secondary | ICD-10-CM | POA: Diagnosis not present

## 2022-05-08 DIAGNOSIS — K5669 Other partial intestinal obstruction: Secondary | ICD-10-CM | POA: Diagnosis not present

## 2022-05-08 DIAGNOSIS — K436 Other and unspecified ventral hernia with obstruction, without gangrene: Principal | ICD-10-CM | POA: Diagnosis present

## 2022-05-08 DIAGNOSIS — R1084 Generalized abdominal pain: Secondary | ICD-10-CM | POA: Diagnosis not present

## 2022-05-08 DIAGNOSIS — E876 Hypokalemia: Secondary | ICD-10-CM | POA: Diagnosis present

## 2022-05-08 DIAGNOSIS — E162 Hypoglycemia, unspecified: Secondary | ICD-10-CM

## 2022-05-08 DIAGNOSIS — K5900 Constipation, unspecified: Secondary | ICD-10-CM | POA: Diagnosis not present

## 2022-05-08 DIAGNOSIS — N281 Cyst of kidney, acquired: Secondary | ICD-10-CM | POA: Diagnosis not present

## 2022-05-08 DIAGNOSIS — E860 Dehydration: Secondary | ICD-10-CM | POA: Diagnosis present

## 2022-05-08 DIAGNOSIS — F17210 Nicotine dependence, cigarettes, uncomplicated: Secondary | ICD-10-CM | POA: Diagnosis present

## 2022-05-08 DIAGNOSIS — M419 Scoliosis, unspecified: Secondary | ICD-10-CM | POA: Diagnosis not present

## 2022-05-08 DIAGNOSIS — R1111 Vomiting without nausea: Secondary | ICD-10-CM | POA: Diagnosis not present

## 2022-05-08 DIAGNOSIS — K6389 Other specified diseases of intestine: Secondary | ICD-10-CM | POA: Diagnosis not present

## 2022-05-08 LAB — I-STAT CHEM 8, ED
BUN: 20 mg/dL (ref 8–23)
Calcium, Ion: 1.36 mmol/L (ref 1.15–1.40)
Chloride: 96 mmol/L — ABNORMAL LOW (ref 98–111)
Creatinine, Ser: 0.8 mg/dL (ref 0.44–1.00)
Glucose, Bld: 142 mg/dL — ABNORMAL HIGH (ref 70–99)
HCT: 46 % (ref 36.0–46.0)
Hemoglobin: 15.6 g/dL — ABNORMAL HIGH (ref 12.0–15.0)
Potassium: 3.3 mmol/L — ABNORMAL LOW (ref 3.5–5.1)
Sodium: 138 mmol/L (ref 135–145)
TCO2: 29 mmol/L (ref 22–32)

## 2022-05-08 LAB — CBC WITH DIFFERENTIAL/PLATELET
Abs Immature Granulocytes: 0.05 10*3/uL (ref 0.00–0.07)
Basophils Absolute: 0 10*3/uL (ref 0.0–0.1)
Basophils Relative: 0 %
Eosinophils Absolute: 0 10*3/uL (ref 0.0–0.5)
Eosinophils Relative: 0 %
HCT: 47.1 % — ABNORMAL HIGH (ref 36.0–46.0)
Hemoglobin: 15.8 g/dL — ABNORMAL HIGH (ref 12.0–15.0)
Immature Granulocytes: 0 %
Lymphocytes Relative: 10 %
Lymphs Abs: 1.2 10*3/uL (ref 0.7–4.0)
MCH: 31.1 pg (ref 26.0–34.0)
MCHC: 33.5 g/dL (ref 30.0–36.0)
MCV: 92.7 fL (ref 80.0–100.0)
Monocytes Absolute: 0.6 10*3/uL (ref 0.1–1.0)
Monocytes Relative: 5 %
Neutro Abs: 10.2 10*3/uL — ABNORMAL HIGH (ref 1.7–7.7)
Neutrophils Relative %: 85 %
Platelets: 193 10*3/uL (ref 150–400)
RBC: 5.08 MIL/uL (ref 3.87–5.11)
RDW: 12.5 % (ref 11.5–15.5)
WBC: 12.1 10*3/uL — ABNORMAL HIGH (ref 4.0–10.5)
nRBC: 0 % (ref 0.0–0.2)

## 2022-05-08 MED ORDER — IOHEXOL 300 MG/ML  SOLN
100.0000 mL | Freq: Once | INTRAMUSCULAR | Status: DC | PRN
  Administered 2022-05-08: 100 mL via INTRAVENOUS

## 2022-05-08 MED ORDER — ONDANSETRON HCL 4 MG/2ML IJ SOLN
4.0000 mg | Freq: Once | INTRAMUSCULAR | Status: AC
  Administered 2022-05-08: 4 mg via INTRAVENOUS
  Filled 2022-05-08: qty 2

## 2022-05-08 MED ORDER — SODIUM CHLORIDE 0.9 % IV BOLUS
1000.0000 mL | Freq: Once | INTRAVENOUS | Status: AC
  Administered 2022-05-08: 1000 mL via INTRAVENOUS

## 2022-05-08 MED ORDER — MORPHINE SULFATE (PF) 4 MG/ML IV SOLN
4.0000 mg | Freq: Once | INTRAVENOUS | Status: AC
  Administered 2022-05-08: 4 mg via INTRAVENOUS
  Filled 2022-05-08: qty 1

## 2022-05-08 NOTE — ED Triage Notes (Signed)
Pt. BIB [facility] c/o abdominal pain N&V since 2pm. Per EMS pt. Is also dizzy. Given 4mg  zofran by EMS.

## 2022-05-08 NOTE — ED Provider Notes (Signed)
[facility]-EMERGENCY DEPT Provider Note   CSN: [id number] Arrival date & time: 05/08/22  2146     History  Chief Complaint  Patient presents with   Abdominal Pain    Kristina Gates is a 66 y.o. female.  66 year old female presents today for evaluation of abdominal pain since 2pm with associated nausea and vomiting.  States she has had mild abdominal pain for about a year intermittently but nothing like what she has today.  She does report constipation and is unsure when she had a significant bowel movement last.  She states her last bowel movement was yesterday but did not have much output.  States she did have to strain.  Denies melanotic stool, bright red blood per rectum.  Denies hematemesis.  Denies chest pain, shortness of breath or other complaints.  States she has had 3 episode of emesis.  Denies dysuria, flank pain.  The history is provided by the patient. No language interpreter was used.       Home Medications Prior to Admission medications   Medication Sig Start Date End Date Taking? Authorizing Provider  acetaminophen (TYLENOL) 500 MG tablet Take 500-1,000 mg by mouth every 6 (six) hours as needed for headache (pain).    [provider]  Aspirin-Caffeine (ANACIN) 400-32 MG TABS Take 2 tablets by mouth as needed for pain.    [provider]  levothyroxine (SYNTHROID) 100 MCG tablet Take 1 tablet (100 mcg total) by mouth daily before breakfast. 06/01/21   Medina-Vargas, Monina C, NP  losartan (COZAAR) 50 MG tablet Take 50 mg by mouth daily. 12/22/21   [provider]  metoprolol succinate (TOPROL XL) 25 MG 24 hr tablet Take 1 tablet (25 mg total) by mouth daily. 01/04/22   Graciella Freer, PA-C  Multiple Vitamin (MULTIVITAMIN WITH MINERALS) TABS tablet Take 1 tablet by mouth daily with lunch.    [provider]  NON FORMULARY Diet:Heart Healthy/CCD    [provider]  rosuvastatin (CRESTOR) 20 MG tablet  Take 1 tablet (20 mg total) by mouth daily. 06/01/21   Medina-Vargas, Monina C, NP      Allergies    Metformin and related and Zestril [lisinopril]    Review of Systems   Review of Systems  Constitutional:  Negative for chills and fever.  Respiratory:  Negative for shortness of breath.   Gastrointestinal:  Positive for abdominal pain, constipation, nausea and vomiting. Negative for blood in stool.  Genitourinary:  Negative for dysuria and flank pain.  Neurological:  Negative for light-headedness.  All other systems reviewed and are negative.   Physical Exam Updated Vital Signs BP 122/71   Pulse 69   Temp 97.7 F (36.5 C) (Oral)   Resp (!) 22   Ht 5\' 5"  (1.651 m)   Wt 66.7 kg   SpO2 98%   BMI 24.46 kg/m  Physical Exam Vitals and nursing note reviewed.  Constitutional:      General: She is not in acute distress.    Appearance: Normal appearance. She is not ill-appearing.  HENT:     Head: Normocephalic and atraumatic.     Nose: Nose normal.  Eyes:     General: No scleral icterus.    Extraocular Movements: Extraocular movements intact.     Conjunctiva/sclera: Conjunctivae normal.  Cardiovascular:     Rate and Rhythm: Normal rate and regular rhythm.     Pulses: Normal pulses.     Heart sounds: Normal heart sounds.  Pulmonary:  Effort: Pulmonary effort is normal. No respiratory distress.     Breath sounds: Normal breath sounds. No wheezing or rales.  Abdominal:     General: There is distension.     Palpations: Abdomen is rigid.     Tenderness: There is generalized abdominal tenderness.  Musculoskeletal:        General: Normal range of motion.     Cervical back: Normal range of motion.  Skin:    General: Skin is warm and dry.  Neurological:     General: No focal deficit present.     Mental Status: She is alert. Mental status is at baseline.     ED Results / Procedures / Treatments   Labs (all labs ordered are listed, but only abnormal results are  displayed) Labs Reviewed  CBC WITH DIFFERENTIAL/PLATELET  COMPREHENSIVE METABOLIC PANEL  LIPASE, BLOOD  URINALYSIS, ROUTINE W REFLEX MICROSCOPIC  I-STAT CHEM 8, ED    EKG None  Radiology No results found.  Procedures Procedures    Medications Ordered in ED Medications  ondansetron (ZOFRAN) injection 4 mg (has no administration in time range)  morphine (PF) 4 MG/ML injection 4 mg (has no administration in time range)  sodium chloride 0.9 % bolus 1,000 mL (has no administration in time range)    ED Course/ Medical Decision Making/ A&P Clinical Course as of 05/08/22 2329  Sun May 08, 2022  2324 FU on labs and CT and determine dispo [BH]    Clinical Course User Index [BH] Henderly, Britni A, PA-C                           Medical Decision Making Amount and/or Complexity of Data Reviewed Labs: ordered. Radiology: ordered.  Risk Prescription drug management.   Medical Decision Making / ED Course   This patient presents to the ED for concern of abdominal pain, nausea, vomiting, this involves an extensive number of treatment options, and is a complaint that carries with it a high risk of complications and morbidity.  The differential diagnosis includes small bowel obstruction, appendicitis, cholecystitis, constipation, gastroenteritis  MDM: 66 year old female presents today for evaluation of abdominal pain since around 2 PM associated with nausea and vomiting.  Denies fever, dysuria, flank pain, shortness of breath, or chest pain.  Will evaluate with blood work, obtain KUB, and CT abdomen pelvis with contrast.  Will provide pain medication, Zofran, and fluid bolus. CBC shows mild leukocytosis otherwise without anemia or acute findings.  Hemoconcentration seen could be secondary to dehydration.  She is getting a fluid bolus.  Reports improvement in pain and nausea following medications.  Patient at the end of my shift is awaiting rest of her work-up including labs and  imaging.  EKG does not show acute ischemic changes.  Patient signed out to oncoming provider.  Patient has history of hysterectomy otherwise without history of abdominal surgeries.  Lab Tests: -I ordered, reviewed, and interpreted labs.   The pertinent results include:   Labs Reviewed  CBC WITH DIFFERENTIAL/PLATELET  COMPREHENSIVE METABOLIC PANEL  LIPASE, BLOOD  URINALYSIS, ROUTINE W REFLEX MICROSCOPIC  I-STAT CHEM 8, ED      EKG  EKG Interpretation  Date/Time:    Ventricular Rate:    PR Interval:    QRS Duration:   QT Interval:    QTC Calculation:   R Axis:     Text Interpretation:           Medicines ordered and prescription drug  management: Meds ordered this encounter  Medications   ondansetron (ZOFRAN) injection 4 mg   morphine (PF) 4 MG/ML injection 4 mg   sodium chloride 0.9 % bolus 1,000 mL    -I have revie Reevaluation: After the interventions noted above, I reevaluated the patient and found that they have :improved  Co morbidities that complicate the patient evaluation  Past Medical History:  Diagnosis Date   Diabetes mellitus with renal complications (HCC) 2022   AKI in context of rhabdo   Hypercholesteremia    Hypertension    Postprocedural hypothyroidism       Dispostion: Patient at the end of my shift has been signed out to [employer] for follow-up on labs, imaging and for disposition.  Final Clinical Impression(s) / ED Diagnoses Final diagnoses:  None    Rx / DC Orders ED Discharge Orders     None         Marita Kansas, PA-C 05/08/22 2331    Bethann Berkshire, MD 05/09/22 1130

## 2022-05-09 ENCOUNTER — Emergency Department (HOSPITAL_COMMUNITY): Payer: Medicare HMO

## 2022-05-09 ENCOUNTER — Inpatient Hospital Stay (HOSPITAL_COMMUNITY): Payer: Medicare HMO

## 2022-05-09 DIAGNOSIS — Z79890 Hormone replacement therapy (postmenopausal): Secondary | ICD-10-CM | POA: Diagnosis not present

## 2022-05-09 DIAGNOSIS — Z833 Family history of diabetes mellitus: Secondary | ICD-10-CM | POA: Diagnosis not present

## 2022-05-09 DIAGNOSIS — I739 Peripheral vascular disease, unspecified: Secondary | ICD-10-CM

## 2022-05-09 DIAGNOSIS — D72829 Elevated white blood cell count, unspecified: Secondary | ICD-10-CM | POA: Diagnosis present

## 2022-05-09 DIAGNOSIS — F17210 Nicotine dependence, cigarettes, uncomplicated: Secondary | ICD-10-CM | POA: Diagnosis present

## 2022-05-09 DIAGNOSIS — E1165 Type 2 diabetes mellitus with hyperglycemia: Secondary | ICD-10-CM | POA: Diagnosis present

## 2022-05-09 DIAGNOSIS — Z79899 Other long term (current) drug therapy: Secondary | ICD-10-CM | POA: Diagnosis not present

## 2022-05-09 DIAGNOSIS — Z8249 Family history of ischemic heart disease and other diseases of the circulatory system: Secondary | ICD-10-CM | POA: Diagnosis not present

## 2022-05-09 DIAGNOSIS — E860 Dehydration: Secondary | ICD-10-CM | POA: Diagnosis present

## 2022-05-09 DIAGNOSIS — K56609 Unspecified intestinal obstruction, unspecified as to partial versus complete obstruction: Secondary | ICD-10-CM | POA: Diagnosis present

## 2022-05-09 DIAGNOSIS — K436 Other and unspecified ventral hernia with obstruction, without gangrene: Secondary | ICD-10-CM | POA: Diagnosis present

## 2022-05-09 DIAGNOSIS — Z888 Allergy status to other drugs, medicaments and biological substances status: Secondary | ICD-10-CM | POA: Diagnosis not present

## 2022-05-09 DIAGNOSIS — Z823 Family history of stroke: Secondary | ICD-10-CM | POA: Diagnosis not present

## 2022-05-09 DIAGNOSIS — E890 Postprocedural hypothyroidism: Secondary | ICD-10-CM | POA: Diagnosis present

## 2022-05-09 DIAGNOSIS — E876 Hypokalemia: Secondary | ICD-10-CM | POA: Diagnosis present

## 2022-05-09 DIAGNOSIS — I10 Essential (primary) hypertension: Secondary | ICD-10-CM | POA: Diagnosis present

## 2022-05-09 DIAGNOSIS — E7800 Pure hypercholesterolemia, unspecified: Secondary | ICD-10-CM | POA: Diagnosis present

## 2022-05-09 DIAGNOSIS — E11649 Type 2 diabetes mellitus with hypoglycemia without coma: Secondary | ICD-10-CM | POA: Diagnosis not present

## 2022-05-09 DIAGNOSIS — K439 Ventral hernia without obstruction or gangrene: Secondary | ICD-10-CM | POA: Diagnosis present

## 2022-05-09 DIAGNOSIS — Z90710 Acquired absence of both cervix and uterus: Secondary | ICD-10-CM | POA: Diagnosis not present

## 2022-05-09 LAB — URINALYSIS, ROUTINE W REFLEX MICROSCOPIC
Bacteria, UA: NONE SEEN
Bilirubin Urine: NEGATIVE
Glucose, UA: NEGATIVE mg/dL
Hgb urine dipstick: NEGATIVE
Ketones, ur: NEGATIVE mg/dL
Nitrite: NEGATIVE
Protein, ur: 30 mg/dL — AB
Specific Gravity, Urine: 1.024 (ref 1.005–1.030)
pH: 5 (ref 5.0–8.0)

## 2022-05-09 LAB — COMPREHENSIVE METABOLIC PANEL
ALT: 28 U/L (ref 0–44)
AST: 31 U/L (ref 15–41)
Albumin: 4.6 g/dL (ref 3.5–5.0)
Alkaline Phosphatase: 72 U/L (ref 38–126)
Anion gap: 13 (ref 5–15)
BUN: 21 mg/dL (ref 8–23)
CO2: 28 mmol/L (ref 22–32)
Calcium: 11.2 mg/dL — ABNORMAL HIGH (ref 8.9–10.3)
Chloride: 98 mmol/L (ref 98–111)
Creatinine, Ser: 0.79 mg/dL (ref 0.44–1.00)
GFR, Estimated: >60 mL/min (ref >60)
Glucose, Bld: 145 mg/dL — ABNORMAL HIGH (ref 70–99)
Potassium: 3.3 mmol/L — ABNORMAL LOW (ref 3.5–5.1)
Sodium: 139 mmol/L (ref 135–145)
Total Bilirubin: 0.6 mg/dL (ref 0.3–1.2)
Total Protein: 8 g/dL (ref 6.5–8.1)

## 2022-05-09 LAB — GLUCOSE, CAPILLARY
Glucose-Capillary: 70 mg/dL (ref 70–99)
Glucose-Capillary: 71 mg/dL (ref 70–99)

## 2022-05-09 LAB — LIPASE, BLOOD: Lipase: 34 U/L (ref 11–51)

## 2022-05-09 LAB — MAGNESIUM: Magnesium: 1.9 mg/dL (ref 1.7–2.4)

## 2022-05-09 LAB — PHOSPHORUS: Phosphorus: 4.1 mg/dL (ref 2.5–4.6)

## 2022-05-09 MED ORDER — DEXTROSE-NACL 5-0.9 % IV SOLN: INTRAVENOUS | Status: DC

## 2022-05-09 MED ORDER — ENOXAPARIN SODIUM 40 MG/0.4ML IJ SOSY
40.0000 mg | PREFILLED_SYRINGE | INTRAMUSCULAR | Status: DC
  Administered 2022-05-09 – 2022-05-10 (×2): 40 mg via SUBCUTANEOUS
  Filled 2022-05-09 (×2): qty 0.4

## 2022-05-09 MED ORDER — ACETAMINOPHEN 325 MG PO TABS: 650.0000 mg | ORAL_TABLET | Freq: Four times a day (QID) | ORAL | Status: DC | PRN

## 2022-05-09 MED ORDER — ONDANSETRON HCL 4 MG PO TABS: 4.0000 mg | ORAL_TABLET | Freq: Four times a day (QID) | ORAL | Status: DC | PRN

## 2022-05-09 MED ORDER — METOPROLOL TARTRATE 5 MG/5ML IV SOLN
5.0000 mg | Freq: Two times a day (BID) | INTRAVENOUS | Status: DC
  Administered 2022-05-09: 5 mg via INTRAVENOUS
  Filled 2022-05-09 (×2): qty 5

## 2022-05-09 MED ORDER — ACETAMINOPHEN 650 MG RE SUPP: 650.0000 mg | Freq: Four times a day (QID) | RECTAL | Status: DC | PRN

## 2022-05-09 MED ORDER — POTASSIUM CHLORIDE IN NACL 20-0.9 MEQ/L-% IV SOLN
INTRAVENOUS | Status: DC
  Filled 2022-05-09 (×3): qty 1000

## 2022-05-09 MED ORDER — PANTOPRAZOLE SODIUM 40 MG IV SOLR
40.0000 mg | INTRAVENOUS | Status: DC
  Administered 2022-05-09 – 2022-05-10 (×2): 40 mg via INTRAVENOUS
  Filled 2022-05-09 (×2): qty 10

## 2022-05-09 MED ORDER — MAGNESIUM SULFATE 2 GM/50ML IV SOLN
2.0000 g | Freq: Once | INTRAVENOUS | Status: AC
  Administered 2022-05-09: 2 g via INTRAVENOUS
  Filled 2022-05-09: qty 50

## 2022-05-09 MED ORDER — POTASSIUM CHLORIDE IN NACL 40-0.9 MEQ/L-% IV SOLN
INTRAVENOUS | Status: AC
  Administered 2022-05-09: 250 mL/h via INTRAVENOUS
  Filled 2022-05-09: qty 1000

## 2022-05-09 MED ORDER — ONDANSETRON HCL 4 MG/2ML IJ SOLN: 4.0000 mg | Freq: Four times a day (QID) | INTRAMUSCULAR | Status: DC | PRN

## 2022-05-09 MED ORDER — IOHEXOL 9 MG/ML PO SOLN: 500.0000 mL | ORAL | Status: AC

## 2022-05-09 MED ORDER — MORPHINE SULFATE (PF) 2 MG/ML IV SOLN: 2.0000 mg | INTRAVENOUS | Status: DC | PRN

## 2022-05-09 NOTE — ED Provider Notes (Signed)
Care assumed from previous provider, see note for full HPI.  Plan to follow-up on CT imaging  In summation 66 year old history of SBO here for evaluation of abdominal pain and constipation as well as emesis. Physical Exam  BP 133/66   Pulse 84   Temp 97.7 F (36.5 C) (Oral)   Resp (!) 24   Ht 5\' 5"  (1.651 m)   Wt 66.7 kg   SpO2 94%   BMI 24.46 kg/m   Physical Exam Vitals and nursing note reviewed.  Constitutional:      General: She is not in acute distress.    Appearance: She is well-developed. She is not ill-appearing.  HENT:     Head: Atraumatic.  Eyes:     Pupils: Pupils are equal, round, and reactive to light.  Cardiovascular:     Rate and Rhythm: Normal rate.  Pulmonary:     Effort: No respiratory distress.  Abdominal:     General: Bowel sounds are normal. There is no distension.     Tenderness: There is generalized abdominal tenderness.  Musculoskeletal:        General: Normal range of motion.     Cervical back: Normal range of motion.  Skin:    General: Skin is warm and dry.  Neurological:     General: No focal deficit present.     Mental Status: She is alert.  Psychiatric:        Mood and Affect: Mood normal.     Procedures  Procedures Labs Reviewed  CBC WITH DIFFERENTIAL/PLATELET - Abnormal; Notable for the following components:      Result Value   WBC 12.1 (*)    Hemoglobin 15.8 (*)    HCT 47.1 (*)    Neutro Abs 10.2 (*)    All other components within normal limits  COMPREHENSIVE METABOLIC PANEL - Abnormal; Notable for the following components:   Potassium 3.3 (*)    Glucose, Bld 145 (*)    Calcium 11.2 (*)    All other components within normal limits  URINALYSIS, ROUTINE W REFLEX MICROSCOPIC - Abnormal; Notable for the following components:   Color, Urine AMBER (*)    APPearance HAZY (*)    Protein, ur 30 (*)    Leukocytes,Ua TRACE (*)    All other components within normal limits  I-STAT CHEM 8, ED - Abnormal; Notable for the following  components:   Potassium 3.3 (*)    Chloride 96 (*)    Glucose, Bld 142 (*)    Hemoglobin 15.6 (*)    All other components within normal limits  LIPASE, BLOOD   DG Abdomen 1 View  Result Date: 05/08/2022 CLINICAL DATA:  Abdominal pain. EXAM: ABDOMEN - 1 VIEW COMPARISON:  Abdominal radiograph dated 05/11/2021. FINDINGS: Evaluation is limited due to positioning and body habitus. There is an air-filled loop of bowel in the mid abdomen measuring approximately 5 cm in caliber. Additional mildly air distended small bowel loops noted in the lower abdomen. Findings concerning for obstruction versus ileus. Clinical correlation is recommended. There are changes of the spine. No acute osseous pathology. IMPRESSION: Findings concerning for obstruction versus ileus. Electronically Signed   By: 05/13/2021 M.D.   On: 05/08/2022 23:56    ED Course / MDM   Clinical Course as of 05/09/22 0641  Sun May 08, 2022  2324 FU on labs and CT and determine dispo [BH]  Mon May 09, 2022  0211 Multiple failed Iv attempts. Patient declining further IV attempts at this  time. Will have her PO contrast for her CT scan [BH]    Clinical Course User Index [BH] Neisha Hinger A, PA-C   Medical Decision Making Amount and/or Complexity of Data Reviewed Labs: ordered. Radiology: ordered.  Risk Prescription drug management.   Unfortunately difficult with IV access, greater than 6 attempts made.  Patient did not want additional IV attempts made.  She is not currently vomiting.  We will have her drink oral contrast for her scan.   Care transferred to oncoming PA who will follow-up on CT imaging and determine disposition      Airyn Ellzey A, PA-C 05/09/22 0641    LongArlyss Repress, MD 05/09/22 2329

## 2022-05-09 NOTE — Progress Notes (Signed)
ABI has been completed.   Results can be found under chart review under CV PROC. 05/09/2022 4:46 PM Noha Karasik RVT, RDMS

## 2022-05-09 NOTE — ED Provider Notes (Signed)
Care assumed from previous provider, see note for full HPI.  In summation 66 year old history of SBO here for evaluation of abdominal pain and constipation as well as emesis.  Physical Exam  BP 125/70 (BP Location: Left Arm)   Pulse 89   Temp 97.7 F (36.5 C) (Oral)   Resp (!) 22   Ht 5\' 5"  (1.651 m)   Wt 66.7 kg   SpO2 98%   BMI 24.46 kg/m   Physical Exam Vitals and nursing note reviewed.  Constitutional:      Appearance: Normal appearance.  HENT:     Head: Normocephalic and atraumatic.  Eyes:     Conjunctiva/sclera: Conjunctivae normal.  Cardiovascular:     Rate and Rhythm: Normal rate and regular rhythm.  Pulmonary:     Effort: Pulmonary effort is normal. No respiratory distress.     Breath sounds: Normal breath sounds.  Abdominal:     General: There is no distension.     Palpations: Abdomen is soft.     Tenderness: There is generalized abdominal tenderness.  Skin:    General: Skin is warm and dry.  Neurological:     General: No focal deficit present.     Mental Status: She is alert.    Results   CT ABDOMEN PELVIS WO CONTRAST  Result Date: 05/09/2022 CLINICAL DATA:  66 year old female with abdominal pain. No IV access, oral contrast only. EXAM: CT ABDOMEN AND PELVIS WITHOUT CONTRAST TECHNIQUE: Multidetector CT imaging of the abdomen and pelvis was performed following the standard protocol without IV contrast. RADIATION DOSE REDUCTION: This exam was performed according to the departmental dose-optimization program which includes automated exposure control, adjustment of the mA and/or kV according to patient size and/or use of iterative reconstruction technique. COMPARISON:  CT Chest, Abdomen, and Pelvis 01/21/2021. FINDINGS: Lower chest: Chronic lung base scarring and atelectasis, similar but increased compared to last year. No pericardial or pleural effusion. No convincing pneumonia. Hepatobiliary: Noncontrast liver and gallbladder remain within normal limits. Pancreas:  Within normal limits. Spleen: Within normal limits. Adrenals/Urinary Tract: Within normal limits, chronic right renal midpole cyst (no follow-up imaging recommended). Stomach/Bowel: Oral contrast was administered and distends the stomach, duodenum, and multiple small bowel loops. Chronic mildly complex fat containing paraumbilical hernia now contains a small volume of free fluid. And there is a chronic left lower abdominal hernia inferior to that which now appears contain a dilated and fluid-filled small bowel loops. Appendix remains normal on series 5, image 81. The terminal ileum appears decompressed, and there are some decompressed small bowel loops in the central mesentery at the pelvic inlet. There are also some decompressed small bowel loops in the posterior left upper quadrant. No clear intra-abdominal transition point is identified. No pneumoperitoneum. No free fluid within the abdomen. Large bowel also diffusely mildly dilated with fluid to the level of the rectum. Vascular/Lymphatic: Extensive Aortoiliac calcified atherosclerosis. Normal caliber abdominal aorta. Vascular patency is not evaluated in the absence of IV contrast. No lymphadenopathy identified. Reproductive: Surgically absent uterus. Diminutive or absent ovaries. Other: No pelvic free fluid. Musculoskeletal: Chronic lower rib fractures. L1 compression fracture is chronic with loss of height since last year. No significant retropulsion. Chronic L4-L5 spondylolisthesis and facet arthropathy. No acute osseous abnormality identified. IMPRESSION: 1. Diffusely dilated and fluid-filled small bowel loops suspicious for Acute Small Bowel Obstruction. Point of obstruction might be a chronic left lower abdomen ventral hernia which now seems to contain a fluid-filled small bowel loop. Recommend NG tube decompression, and repeat CT  Abdomen and Pelvis might be valuable once the small bowel is less dilated. 2. Additionally, the colon is fluid-filled and  mildly distended throughout. This suggests enteritis and/or diarrhea. But there seem to be intervening decompressed distal small bowel loops between #1 and the large bowel, corroborating a small-bowel obstruction. 3. No free air identified.  Small if any free fluid volume. Electronically Signed   By: Odessa Fleming M.D.   On: 05/09/2022 07:32   ED Course / MDM   Clinical Course as of 05/09/22 0743  Sun May 08, 2022  2324 FU on labs and CT and determine dispo [BH]  Mon May 09, 2022  0211 Multiple failed Iv attempts. Patient declining further IV attempts at this time. Will have her PO contrast for her CT scan [BH]    Clinical Course User Index [BH] Henderly, Britni A, PA-C   Medical Decision Making Amount and/or Complexity of Data Reviewed Labs: ordered. Radiology: ordered.  Risk Prescription drug management.  Difficulty with IV access, drank PO contrast. Plan to follow up on CT imaging to determine disposition.   CT imaging with concern for SBO. Will keep patient NPO, consult surgery and admit to medicine. Discussed with hospitalist Dr. Robb Matar who will admit.      Thomson Herbers T, PA-C 05/09/22 0816    Melene Plan, DO 05/09/22 1259

## 2022-05-09 NOTE — ED Notes (Signed)
Unsuccessful [place] IV attempt x2 in L AC, R AC.

## 2022-05-09 NOTE — H&P (Signed)
History and Physical    Patient: Kristina Gates ZOX:[id number] DOB: [birth year] DOA: 05/08/2022 DOS: the patient was seen and examined on 05/09/2022 PCP: Farris Has, MD  Patient coming from: Home  Chief Complaint:  Chief Complaint  Patient presents with   Abdominal Pain   HPI: Kristina Gates is a 66 y.o. female with medical history significant of type 2 diabetes, hyperlipidemia, hypertension, postprocedural hypothyroidism who is coming to the emergency department with abdominal pain since 1400 yesterday associated with profuse diarrhea and several episodes of nausea and vomiting this morning on the way to the hospital.  She stated that after emesis her pain has subsided.  No history of previous SBO.  She had a large ventral hernia that was reduced by Dr. Dwain Sarna from general surgery.  No constipation, melena or hematochezia. No fever, chills or night sweats. No sore throat, rhinorrhea, dyspnea, wheezing or hemoptysis.  No chest pain, palpitations, diaphoresis, PND, orthopnea or pitting edema of the lower extremities. No flank pain, dysuria, frequency or hematuria.  No polyuria, polydipsia, polyphagia or blurred vision.  ED course: Initial vital signs were temperature 97.7 F, pulse 69, respirations 22, BP 122/71 mmHg and O2 sat 98% on room air.  The patient received 1000 mL of NS bolus, morphine 4 mg IVP ondansetron 4 mg IVP.  I added NS plus KCl 40 mEq 1000 mL over 4 hours.  Lab work: Urinalysis was hazy with proteinuria 30 mg/dL and trace leukocyte esterase microscopic examination was unremarkable.  Her CBC showed a her white count 12.1, hemoglobin 15.8 g/dL platelets 811.  CMP showed a potassium of 3.3 mmol/L, glucose of 145 and calcium of 11.2 mg/dL.  Lipase was normal.  Imaging: Abdominal x-ray with findings concerning for obstruction versus ileus.  CT abdomen with diffusely dilated and fluid-filled small bowel loops suspicious for Accu SBO.    Review of Systems: As mentioned in the  history of present illness. All other systems reviewed and are negative.  Past Medical History:  Diagnosis Date   Diabetes mellitus with renal complications (HCC) 2022   AKI in context of rhabdo   Hypercholesteremia    Hypertension    Postprocedural hypothyroidism    Past Surgical History:  Procedure Laterality Date   COLON SURGERY     Social History:  reports that she has been smoking cigarettes. She has never used smokeless tobacco. She reports current alcohol use. She reports that she does not use drugs.  Allergies  Allergen Reactions   Metformin And Related Cough   Zestril [Lisinopril] Cough    Family History  Problem Relation Age of Onset   Diabetes Mother    Hypertension Mother    Cancer Father    Stroke Father     Prior to Admission medications   Medication Sig Start Date End Date Taking? Authorizing Provider  Aspirin-Caffeine (ANACIN) 400-32 MG TABS Take 2 tablets by mouth as needed for pain.   Yes [provider]  levothyroxine (SYNTHROID) 100 MCG tablet Take 1 tablet (100 mcg total) by mouth daily before breakfast. 06/01/21  Yes Medina-Vargas, Monina C, NP  losartan (COZAAR) 50 MG tablet Take 50 mg by mouth daily. 12/22/21  Yes [provider]  metoprolol succinate (TOPROL XL) 25 MG 24 hr tablet Take 1 tablet (25 mg total) by mouth daily. 01/04/22  Yes Graciella Freer, PA-C  Multiple Vitamin (MULTIVITAMIN WITH MINERALS) TABS tablet Take 1 tablet by mouth daily with lunch.   Yes [provider]  rosuvastatin (CRESTOR) 20  MG tablet Take 1 tablet (20 mg total) by mouth daily. 06/01/21  Yes Medina-Vargas, Monina C, NP    Physical Exam: Vitals:   05/09/22 1415 05/09/22 1500 05/09/22 1530 05/09/22 1545  BP:  110/90 112/62   Pulse: 71 74 72 72  Resp: 14 18 (!) 24 (!) 26  Temp:      TempSrc:      SpO2: 100% 93% 98% 96%  Weight:      Height:       Physical Exam Vitals and nursing note reviewed.  Constitutional:      General: She is  awake.     Appearance: She is well-developed and normal weight.  HENT:     Head: Normocephalic.     Mouth/Throat:     Mouth: Mucous membranes are dry.  Eyes:     General: No scleral icterus.    Pupils: Pupils are equal, round, and reactive to light.  Cardiovascular:     Rate and Rhythm: Normal rate and regular rhythm.  Pulmonary:     Effort: Pulmonary effort is normal.     Breath sounds: Normal breath sounds.  Abdominal:     General: There is no distension.     Palpations: Abdomen is soft.     Tenderness: There is abdominal tenderness in the epigastric area and periumbilical area. There is no right CVA tenderness, left CVA tenderness, guarding or rebound.     Hernia: A hernia is present. Hernia is present in the ventral area.  Musculoskeletal:     Cervical back: Neck supple.     Right lower leg: No edema.     Left lower leg: No edema.  Skin:    General: Skin is warm and dry.  Neurological:     General: No focal deficit present.     Mental Status: She is alert and oriented to person, place, and time.  Psychiatric:        Mood and Affect: Mood normal.        Behavior: Behavior normal. Behavior is cooperative.   Data Reviewed:  There are no new results to review at this time.  Assessment and Plan: Principal Problem:   SBO (small bowel obstruction) (HCC) Observation/MedSurg. Keep NPO. Continue IV fluids. Analgesics as needed. Antiemetics as needed. Pantoprazole 40 mg IVP every 24 hours. Keep electrolytes optimized. Follow-up CBC and CMP in AM. Follow-up imaging in the morning. General surgery input appreciated.  Active Problems:   Essential hypertension Metoprolol 5 mg IVP every 12 hours. Monitor BP and heart rate. Switch to oral formulation once cleared for oral intake.    Hyperglycemia due to type 2 diabetes mellitus (HCC) Currently NPO. Continue IV fluids. CBG monitoring every 6 hours.    Postoperative hypothyroidism Resume levothyroxine 100 mcg p.o.  daily.    Pure hypercholesterolemia Hold rosuvastatin while NPO.    Hypercalcemia Recheck calcium level in AM. Further work-up depending on result.    Hypokalemia Correcting. Magnesium was supplemented.    Advance Care Planning:   Code Status: Full Code   Consults:   Family Communication:    Severity of Illness: The appropriate patient status for this patient is INPATIENT. Inpatient status is judged to be reasonable and necessary in order to provide the required intensity of service to ensure the patient's safety. The patient's presenting symptoms, physical exam findings, and initial radiographic and laboratory data in the context of their chronic comorbidities is felt to place them at high risk for further clinical deterioration. Furthermore, it is not  anticipated that the patient will be medically stable for discharge from the hospital within 2 midnights of admission.   * I certify that at the point of admission it is my clinical judgment that the patient will require inpatient hospital care spanning beyond 2 midnights from the point of admission due to high intensity of service, high risk for further deterioration and high frequency of surveillance required.*  Author: Reubin Milan, MD 05/09/2022 3:51 PM  For on call review [email or web address].   This document was prepared using Dragon voice recognition software and may contain some unintended transcription errors.

## 2022-05-09 NOTE — Consult Note (Signed)
Reason for Consult:sbo by ct Referring Physician: Dr Doreene Burke is an 66 y.o. female.  HPI: 7 yof with prior history of ab surgery who presents with ab pain since 2 pm yesterday. Kristina Gates has known vh that Kristina Gates feels was more swollen. Kristina Gates is having large volume loose stools as well. Kristina Gates had n/v on the ride to the hospital but none otherwise. Kristina Gates is not in pain now.  No urinary issues. Never had this before.  Kristina Gates had a ct scan that shows possible sbo, possibly related to hernia and I was asked to see her.   Past Medical History:  Diagnosis Date   Diabetes mellitus with renal complications (HCC) 2022   AKI in context of rhabdo   Hypercholesteremia    Hypertension    Postprocedural hypothyroidism     Past Surgical History:  Procedure Laterality Date   COLON SURGERY      Family History  Problem Relation Age of Onset   Diabetes Mother    Hypertension Mother    Cancer Father    Stroke Father     Social History:  reports that Kristina Gates has been smoking cigarettes. Kristina Gates has never used smokeless tobacco. Kristina Gates reports current alcohol use. Kristina Gates reports that Kristina Gates does not use drugs.  Allergies:  Allergies  Allergen Reactions   Metformin And Related Cough   Zestril [Lisinopril] Cough    Medications: I have reviewed the patient's current medications. No current facility-administered medications on file prior to encounter.   Current Outpatient Medications on File Prior to Encounter  Medication Sig Dispense Refill   Aspirin-Caffeine (ANACIN) 400-32 MG TABS Take 2 tablets by mouth as needed for pain.     levothyroxine (SYNTHROID) 100 MCG tablet Take 1 tablet (100 mcg total) by mouth daily before breakfast. 30 tablet 0   losartan (COZAAR) 50 MG tablet Take 50 mg by mouth daily.     metoprolol succinate (TOPROL XL) 25 MG 24 hr tablet Take 1 tablet (25 mg total) by mouth daily. 90 tablet 1   Multiple Vitamin (MULTIVITAMIN WITH MINERALS) TABS tablet Take 1 tablet by mouth daily with lunch.      rosuvastatin (CRESTOR) 20 MG tablet Take 1 tablet (20 mg total) by mouth daily. 30 tablet 0    Results for orders placed or performed during the hospital encounter of 05/08/22 (from the past 48 hour(s))  Urinalysis, Routine w reflex microscopic Urine, Clean Catch     Status: Abnormal   Collection Time: 05/08/22 10:11 PM  Result Value Ref Range   Color, Urine AMBER (A) YELLOW    Comment: BIOCHEMICALS MAY BE AFFECTED BY COLOR   APPearance HAZY (A) CLEAR   Specific Gravity, Urine 1.024 1.005 - 1.030   pH 5.0 5.0 - 8.0   Glucose, UA NEGATIVE NEGATIVE mg/dL   Hgb urine dipstick NEGATIVE NEGATIVE   Bilirubin Urine NEGATIVE NEGATIVE   Ketones, ur NEGATIVE NEGATIVE mg/dL   Protein, ur 30 (A) NEGATIVE mg/dL   Nitrite NEGATIVE NEGATIVE   Leukocytes,Ua TRACE (A) NEGATIVE   RBC / HPF 0-5 0 - 5 RBC/hpf   WBC, UA 0-5 0 - 5 WBC/hpf   Bacteria, UA NONE SEEN NONE SEEN   Squamous Epithelial / LPF 0-5 0 - 5   Hyaline Casts, UA PRESENT     Comment: Performed at [facility], [address], [place], [place] [address]  CBC with Differential     Status: Abnormal   Collection Time: 05/08/22 10:45 PM  Result  Value Ref Range   WBC 12.1 (H) 4.0 - 10.5 K/uL   RBC 5.08 3.87 - 5.11 MIL/uL   Hemoglobin 15.8 (H) 12.0 - 15.0 g/dL   HCT 47.1 (H) 36.0 - 46.0 %   MCV 92.7 80.0 - 100.0 fL   MCH 31.1 26.0 - 34.0 pg   MCHC 33.5 30.0 - 36.0 g/dL   RDW 12.5 11.5 - 15.5 %   Platelets 193 150 - 400 K/uL   nRBC 0.0 0.0 - 0.2 %   Neutrophils Relative % 85 %   Neutro Abs 10.2 (H) 1.7 - 7.7 K/uL   Lymphocytes Relative 10 %   Lymphs Abs 1.2 0.7 - 4.0 K/uL   Monocytes Relative 5 %   Monocytes Absolute 0.6 0.1 - 1.0 K/uL   Eosinophils Relative 0 %   Eosinophils Absolute 0.0 0.0 - 0.5 K/uL   Basophils Relative 0 %   Basophils Absolute 0.0 0.0 - 0.1 K/uL   Immature Granulocytes 0 %   Abs Immature Granulocytes 0.05 0.00 - 0.07 K/uL    Comment: Performed at [facility], [place]  [address], [place], [place] [address]  Comprehensive metabolic panel     Status: Abnormal   Collection Time: 05/08/22 10:45 PM  Result Value Ref Range   Sodium 139 135 - 145 mmol/L   Potassium 3.3 (L) 3.5 - 5.1 mmol/L   Chloride 98 98 - 111 mmol/L   CO2 28 22 - 32 mmol/L   Glucose, Bld 145 (H) 70 - 99 mg/dL    Comment: Glucose reference range applies only to samples taken after fasting for at least 8 hours.   BUN 21 8 - 23 mg/dL   Creatinine, Ser 0.79 0.44 - 1.00 mg/dL   Calcium 11.2 (H) 8.9 - 10.3 mg/dL   Total Protein 8.0 6.5 - 8.1 g/dL   Albumin 4.6 3.5 - 5.0 g/dL   AST 31 15 - 41 U/L   ALT 28 0 - 44 U/L   Alkaline Phosphatase 72 38 - 126 U/L   Total Bilirubin 0.6 0.3 - 1.2 mg/dL   GFR, Estimated >60 >60 mL/min    Comment: (NOTE) Calculated using the CKD-EPI Creatinine Equation (2021)    Anion gap 13 5 - 15    Comment: Performed at [facility], [place] [address], [place], [place] [address]  Lipase, blood     Status: None   Collection Time: 05/08/22 10:45 PM  Result Value Ref Range   Lipase 34 11 - 51 U/L    Comment: Performed at [facility], [place] [address], [place], [place] [address]  Magnesium     Status: None   Collection Time: 05/08/22 10:45 PM  Result Value Ref Range   Magnesium 1.9 1.7 - 2.4 mg/dL    Comment: Performed at [facility], [place] [address] [place], [place] [address]  Phosphorus     Status: None   Collection Time: 05/08/22 10:45 PM  Result Value Ref Range   Phosphorus 4.1 2.5 - 4.6 mg/dL    Comment: Performed at [facility], [place] [address], [place], [place] [address]  I-stat chem 8, ED (not at [facility] or [facility])     Status: Abnormal   Collection Time: 05/08/22 11:19 PM  Result Value Ref Range   Sodium 138 135 - 145 mmol/L   Potassium 3.3 (L) 3.5 - 5.1 mmol/L   Chloride 96 (L) 98 - 111 mmol/L   BUN 20 8 - 23  mg/dL   Creatinine, Ser 0.80 0.44 - 1.00 mg/dL   Glucose, Bld 142 (H) 70 - 99  mg/dL    Comment: Glucose reference range applies only to samples taken after fasting for at least 8 hours.   Calcium, Ion 1.36 1.15 - 1.40 mmol/L   TCO2 29 22 - 32 mmol/L   Hemoglobin 15.6 (H) 12.0 - 15.0 g/dL   HCT 46.0 36.0 - 46.0 %    CT ABDOMEN PELVIS WO CONTRAST  Result Date: 05/09/2022 CLINICAL DATA:  66 year old female with abdominal pain. No IV access, oral contrast only. EXAM: CT ABDOMEN AND PELVIS WITHOUT CONTRAST TECHNIQUE: Multidetector CT imaging of the abdomen and pelvis was performed following the standard protocol without IV contrast. RADIATION DOSE REDUCTION: This exam was performed according to the departmental dose-optimization program which includes automated exposure control, adjustment of the mA and/or kV according to patient size and/or use of iterative reconstruction technique. COMPARISON:  CT Chest, Abdomen, and Pelvis 01/21/2021. FINDINGS: Lower chest: Chronic lung base scarring and atelectasis, similar but increased compared to last year. No pericardial or pleural effusion. No convincing pneumonia. Hepatobiliary: Noncontrast liver and gallbladder remain within normal limits. Pancreas: Within normal limits. Spleen: Within normal limits. Adrenals/Urinary Tract: Within normal limits, chronic right renal midpole cyst (no follow-up imaging recommended). Stomach/Bowel: Oral contrast was administered and distends the stomach, duodenum, and multiple small bowel loops. Chronic mildly complex fat containing paraumbilical hernia now contains a small volume of free fluid. And there is a chronic left lower abdominal hernia inferior to that which now appears contain a dilated and fluid-filled small bowel loops. Appendix remains normal on series 5, image 81. The terminal ileum appears decompressed, and there are some decompressed small bowel loops in the central mesentery at the pelvic inlet. There are also some decompressed small bowel loops in the posterior left upper quadrant. No clear  intra-abdominal transition point is identified. No pneumoperitoneum. No free fluid within the abdomen. Large bowel also diffusely mildly dilated with fluid to the level of the rectum. Vascular/Lymphatic: Extensive Aortoiliac calcified atherosclerosis. Normal caliber abdominal aorta. Vascular patency is not evaluated in the absence of IV contrast. No lymphadenopathy identified. Reproductive: Surgically absent uterus. Diminutive or absent ovaries. Other: No pelvic free fluid. Musculoskeletal: Chronic lower rib fractures. L1 compression fracture is chronic with loss of height since last year. No significant retropulsion. Chronic L4-L5 spondylolisthesis and facet arthropathy. No acute osseous abnormality identified. IMPRESSION: 1. Diffusely dilated and fluid-filled small bowel loops suspicious for Acute Small Bowel Obstruction. Point of obstruction might be a chronic left lower abdomen ventral hernia which now seems to contain a fluid-filled small bowel loop. Recommend NG tube decompression, and repeat CT Abdomen and Pelvis might be valuable once the small bowel is less dilated. 2. Additionally, the colon is fluid-filled and mildly distended throughout. This suggests enteritis and/or diarrhea. But there seem to be intervening decompressed distal small bowel loops between #1 and the large bowel, corroborating a small-bowel obstruction. 3. No free air identified.  Small if any free fluid volume. Electronically Signed   By: Genevie Ann M.D.   On: 05/09/2022 07:32   DG Abdomen 1 View  Result Date: 05/08/2022 CLINICAL DATA:  Abdominal pain. EXAM: ABDOMEN - 1 VIEW COMPARISON:  Abdominal radiograph dated 05/11/2021. FINDINGS: Evaluation is limited due to positioning and body habitus. There is an air-filled loop of bowel in the mid abdomen measuring approximately 5 cm in caliber. Additional mildly air distended small bowel loops noted in the lower abdomen. Findings  concerning for obstruction versus ileus. Clinical correlation  is recommended. There are changes of the spine. No acute osseous pathology. IMPRESSION: Findings concerning for obstruction versus ileus. Electronically Signed   By: Elgie Collard M.D.   On: 05/08/2022 23:56    Review of Systems  Gastrointestinal:  Positive for abdominal pain, diarrhea, nausea and vomiting. Negative for abdominal distention.  All other systems reviewed and are negative.  Blood pressure (!) 112/56, pulse 77, temperature 97.7 F (36.5 C), temperature source Oral, resp. rate (!) 24, height 5\' 5"  (1.651 m), weight 66.7 kg, SpO2 100 %. Physical Exam Constitutional:      Appearance: Kristina Gates is well-developed. Kristina Gates is not toxic-appearing.  Eyes:     General: No scleral icterus.    Pupils: Pupils are equal, round, and reactive to light.  Cardiovascular:     Rate and Rhythm: Normal rate and regular rhythm.  Pulmonary:     Effort: Pulmonary effort is normal.     Breath sounds: Normal breath sounds.  Abdominal:     General: Bowel sounds are normal.     Palpations: Abdomen is soft.     Tenderness: There is no abdominal tenderness.     Hernia: A hernia (reduced easily, nontender) is present. Hernia is present in the ventral area.  Skin:    General: Skin is warm and dry.     Capillary Refill: Capillary refill takes less than 2 seconds.  Neurological:     General: No focal deficit present.     Mental Status: Kristina Gates is alert.  Psychiatric:        Mood and Affect: Mood normal.        Behavior: Behavior normal.     Assessment/Plan: SBO -Kristina Gates is having a lot of diarrhea right now and I was able to easily reduce the nontender hernia today. I don't think needs surgery for this now. Agree with admission, iv fluids, sips/chips, repeat xray in am  I reviewed ED provider notes, hospitalist notes, last 24 h vitals and pain scores, last 24 h labs and trends, and last 24 h imaging results.I reviewed cbc,  bmet and ct scan which I reviewed independently  This care required moderate level of  medical decision making.    05/09/2022, 12:52 PM

## 2022-05-10 ENCOUNTER — Inpatient Hospital Stay (HOSPITAL_COMMUNITY): Payer: Medicare HMO

## 2022-05-10 DIAGNOSIS — E162 Hypoglycemia, unspecified: Secondary | ICD-10-CM

## 2022-05-10 DIAGNOSIS — K56609 Unspecified intestinal obstruction, unspecified as to partial versus complete obstruction: Secondary | ICD-10-CM | POA: Diagnosis not present

## 2022-05-10 LAB — CBC
HCT: 39.4 % (ref 36.0–46.0)
Hemoglobin: 12.7 g/dL (ref 12.0–15.0)
MCH: 30.8 pg (ref 26.0–34.0)
MCHC: 32.2 g/dL (ref 30.0–36.0)
MCV: 95.6 fL (ref 80.0–100.0)
Platelets: 135 10*3/uL — ABNORMAL LOW (ref 150–400)
RBC: 4.12 MIL/uL (ref 3.87–5.11)
RDW: 12.6 % (ref 11.5–15.5)
WBC: 5 10*3/uL (ref 4.0–10.5)
nRBC: 0 % (ref 0.0–0.2)

## 2022-05-10 LAB — COMPREHENSIVE METABOLIC PANEL
ALT: 19 U/L (ref 0–44)
AST: 24 U/L (ref 15–41)
Albumin: 3.4 g/dL — ABNORMAL LOW (ref 3.5–5.0)
Alkaline Phosphatase: 52 U/L (ref 38–126)
Anion gap: 6 (ref 5–15)
BUN: 13 mg/dL (ref 8–23)
CO2: 22 mmol/L (ref 22–32)
Calcium: 9.1 mg/dL (ref 8.9–10.3)
Chloride: 114 mmol/L — ABNORMAL HIGH (ref 98–111)
Creatinine, Ser: 0.7 mg/dL (ref 0.44–1.00)
GFR, Estimated: >60 mL/min (ref >60)
Glucose, Bld: 69 mg/dL — ABNORMAL LOW (ref 70–99)
Potassium: 4.3 mmol/L (ref 3.5–5.1)
Sodium: 142 mmol/L (ref 135–145)
Total Bilirubin: 0.9 mg/dL (ref 0.3–1.2)
Total Protein: 6.3 g/dL — ABNORMAL LOW (ref 6.5–8.1)

## 2022-05-10 LAB — HIV ANTIBODY (ROUTINE TESTING W REFLEX): HIV Screen 4th Generation wRfx: NONREACTIVE

## 2022-05-10 LAB — GLUCOSE, CAPILLARY
Glucose-Capillary: 78 mg/dL (ref 70–99)
Glucose-Capillary: 135 mg/dL — ABNORMAL HIGH (ref 70–99)
Glucose-Capillary: 63 mg/dL — ABNORMAL LOW (ref 70–99)
Glucose-Capillary: 73 mg/dL (ref 70–99)
Glucose-Capillary: 89 mg/dL (ref 70–99)

## 2022-05-10 MED ORDER — ADULT MULTIVITAMIN W/MINERALS CH
1.0000 | ORAL_TABLET | Freq: Every day | ORAL | Status: DC
  Administered 2022-05-10: 1 via ORAL
  Filled 2022-05-10: qty 1

## 2022-05-10 MED ORDER — ENSURE ENLIVE PO LIQD: 237.0000 mL | Freq: Two times a day (BID) | ORAL | Status: DC

## 2022-05-10 MED ORDER — DEXTROSE 50 % IV SOLN
12.5000 g | INTRAVENOUS | Status: AC
  Administered 2022-05-10: 12.5 g via INTRAVENOUS
  Filled 2022-05-10: qty 50

## 2022-05-10 MED ORDER — PANTOPRAZOLE SODIUM 40 MG PO TBEC: 40.0000 mg | DELAYED_RELEASE_TABLET | Freq: Every day | ORAL | 0 refills | Status: DC

## 2022-05-10 MED ORDER — LEVOTHYROXINE SODIUM 100 MCG PO TABS: 100.0000 ug | ORAL_TABLET | Freq: Every day | ORAL | Status: DC

## 2022-05-10 MED ORDER — ASPIRIN 81 MG PO TBEC: 81.0000 mg | DELAYED_RELEASE_TABLET | Freq: Every day | ORAL | 0 refills | Status: AC

## 2022-05-10 MED ORDER — PANTOPRAZOLE SODIUM 40 MG PO TBEC: 40.0000 mg | DELAYED_RELEASE_TABLET | Freq: Every day | ORAL | Status: DC

## 2022-05-10 MED ORDER — ROSUVASTATIN CALCIUM 20 MG PO TABS: 20.0000 mg | ORAL_TABLET | Freq: Every day | ORAL | Status: DC

## 2022-05-10 NOTE — Progress Notes (Signed)

## 2022-05-10 NOTE — Plan of Care (Signed)
Pt resting comfortably at this time.  Has refused bed and chair alarm despite warnings from RN regarding low bs levels.  Problem: Education: Goal: Knowledge of General Education information will improve Description: Including pain rating scale, medication(s)/side effects and non-pharmacologic comfort measures Outcome: Progressing   Problem: Health Behavior/Discharge Planning: Goal: Ability to manage health-related needs will improve Outcome: Progressing   Problem: Clinical Measurements: Goal: Ability to maintain clinical measurements within normal limits will improve Outcome: Progressing Goal: Will remain free from infection Outcome: Progressing Goal: Diagnostic test results will improve Outcome: Progressing Goal: Respiratory complications will improve Outcome: Progressing Goal: Cardiovascular complication will be avoided Outcome: Progressing   Problem: Activity: Goal: Risk for activity intolerance will decrease Outcome: Progressing   Problem: Nutrition: Goal: Adequate nutrition will be maintained Outcome: Progressing   Problem: Coping: Goal: Level of anxiety will decrease Outcome: Progressing   Problem: Elimination: Goal: Will not experience complications related to bowel motility Outcome: Progressing Goal: Will not experience complications related to urinary retention Outcome: Progressing   Problem: Pain Managment: Goal: General experience of comfort will improve Outcome: Progressing   Problem: Safety: Goal: Ability to remain free from injury will improve Outcome: Progressing   Problem: Skin Integrity: Goal: Risk for impaired skin integrity will decrease Outcome: Progressing

## 2022-05-10 NOTE — Hospital Course (Addendum)
66 y.o. female with x of T2DM,hyperlipidemia,hypertension, postprocedural hypothyroidism presenting with abdominal pain with diarrhea, several episodes of nausea and vomiting 05/09/2021, no previous SBO history, had a large ventral hernia reduced with Dr. Dwain Sarna from surgery.  CT scan showed diffusely dilated and fluid-filled small bowel loops suspicious for SBO surgery consulted and admitted for further management, patient is passing gas.  Patient has been requesting to go home has been ambulating. Diet was advanced to full liquid diet if tolerates okay for discharge as per general surgery.  Hypokalemia has resolved.  Diet is being advanced-tolerated full liquid diet has been ambulating, at this time surgery has cleared the patient for discharge.

## 2022-05-10 NOTE — Progress Notes (Signed)
Initial Nutrition Assessment  INTERVENTION:   -Ensure Plus High Protein po BID, each supplement provides 350 kcal and 20 grams of protein.   NUTRITION DIAGNOSIS:   Inadequate oral intake related to acute illness as evidenced by per patient/family report.  GOAL:   Patient will meet greater than or equal to 90% of their needs  MONITOR:   PO intake, Supplement acceptance, Labs, Weight trends, I & O's  REASON FOR ASSESSMENT:   Malnutrition Screening Tool    ASSESSMENT:   66 y.o. female with medical history significant of type 2 diabetes, hyperlipidemia, hypertension, postprocedural hypothyroidism who is coming to the emergency department with abdominal pain since 1400 yesterday associated with profuse diarrhea and several episodes of nausea and vomiting this morning on the way to the hospital.  Patient in room, standing at sink. Reports blood sugar is lower (73), hasn't eaten anything in 2.5 days. Was having abdominal pain. Doesn't feel like she can eat that much so she only ordered some grits and prune juice. Offered to order additional items but patient declined. Diet was just advanced to full liquids. She agrees to receive Ensure supplements. States prior to symptoms starting 3-4 days ago she was eating normally. Two meals daily with a snack. Typically snacks on fruit and nuts. Drinks Lactaid milk. Does consume meats such as chicken and beef.  Pt reports being hospitalized in June 2022 for sepsis. Since that time she states she has lost 70 lbs. Per weight records, pt has lost 22 lbs since 05/31/21 (13% wt loss x 11 months, insignificant for time frame).  Medications: D50 infusion, Multivitamin with minerals daily  Labs reviewed:  CBGs: 63-135  NUTRITION - FOCUSED PHYSICAL EXAM:  Flowsheet Row Most Recent Value  Orbital Region No depletion  Upper Arm Region No depletion  Thoracic and Lumbar Region No depletion  Buccal Region Mild depletion  Temple Region Mild depletion   Clavicle Bone Region Mild depletion  Clavicle and Acromion Bone Region Mild depletion  Scapular Bone Region No depletion  Dorsal Hand No depletion  Patellar Region No depletion  Anterior Thigh Region No depletion  Posterior Calf Region No depletion  Edema (RD Assessment) Mild  Hair Reviewed  Eyes Reviewed  Mouth Reviewed  Skin Reviewed       Diet Order:   Diet Order             Diet full liquid Room service appropriate? Yes; Fluid consistency: Thin  Diet effective now                   EDUCATION NEEDS:   No education needs have been identified at this time  Skin:  Skin Assessment: Reviewed RN Assessment  Last BM:  PTA  Height:   Ht Readings from Last 1 Encounters:  05/08/22 5\' 5"  (1.651 m)    Weight:   Wt Readings from Last 1 Encounters:  05/08/22 66.7 kg   BMI:  Body mass index is 24.46 kg/m.  Estimated Nutritional Needs:   Kcal:  1700-1900  Protein:  80-90g  Fluid:  1.9L/day  05/10/22, MS, RD, LDN Inpatient Clinical Dietitian Contact information available via Amion

## 2022-05-10 NOTE — Discharge Summary (Signed)
Physician Discharge Summary  Kristina Gates:[id number] DOB: [birth year] DOA: 05/08/2022  PCP: Farris Has, MD  Admit date: 05/08/2022 Discharge date: 05/10/2022 Recommendations for Outpatient Follow-up:  Follow up with PCP in 1 weeks-call for appointment Please obtain BMP/CBC in one week Follow-up with surgery for elective repair of the hernia  Discharge Dispo: home Discharge Condition: Stable Code Status:   Code Status: Full Code Diet recommendation:  Diet Order             Diet full liquid Room service appropriate? Yes; Fluid consistency: Thin  Diet effective now                    Brief/Interim Summary: 66 y.o. female with x of T2DM,hyperlipidemia,hypertension, postprocedural hypothyroidism presenting with abdominal pain with diarrhea, several episodes of nausea and vomiting 05/09/2021, no previous SBO history, had a large ventral hernia reduced with Dr. Dwain Sarna from surgery.  CT scan showed diffusely dilated and fluid-filled small bowel loops suspicious for SBO surgery consulted and admitted for further management, patient is passing gas.  Patient has been requesting to go home has been ambulating. Diet was advanced to full liquid diet if tolerates okay for discharge as per general surgery.  Hypokalemia has resolved.  Diet is being advanced-tolerated full liquid diet has been ambulating, at this time surgery has cleared the patient for discharge.  Discharge Diagnoses:  Principal Problem:   SBO (small bowel obstruction) (HCC) Active Problems:   Essential hypertension   Hyperglycemia due to type 2 diabetes mellitus (HCC)   Postoperative hypothyroidism   Pure hypercholesterolemia   Hypercalcemia   Hypokalemia   Hypoglycemia  issues SBO: In the setting of ventral hernia, reduced in the ED by surgery team and has been having diarrhea.  On sips/chips IV fluids conservative management with serial x-rays as per general surgery.  Diet placed on full liquid diet as per general  surgery and okay to discharge home if she tolerates diet.She will follow up as outpatient for elective surgical repair.    Hypokalemia: Resolved Hypoglycemia: Asymptomatic in the setting of #1 NPO. after diet -will discontinue IV fluids-Ensure no more drop in sugar before discharge Essential hypertension: BP stable metoprolol, losartan on hold.  Resume upon discharge Hyperglycemia due to type 2 diabetes mellitus: With hypoglycemia insulin regimen on hold Postoperative hypothyroidism: Resume Synthroid in a.m. Pure hypercholesterolemia: resume Crestor Hypercalcemia: Likely hemoconcentrated/dehydration related calcium ionized is normal Incidentally noted severe right lower extremity arterial disease abnormal right toe brachial index.rn checked Doppler-and has good and equal bilateral pulses distal extremities, patient denies any rest pain or pain on walking, appears asymptomatic-I discussed with Dr. Edilia Bo he advised outpatient follow-up with his office which he will arrange and okay to start aspirin 81, already on Crestor.    Consults: General surgery Subjective: Ambulating, requesting to go home today  Discharge Exam: Vitals:   05/10/22 1004 05/10/22 1247  BP: 104/63 (!) 117/59  Pulse:  60  Resp:  18  Temp:  98.1 F (36.7 C)  SpO2:  100%   General: Pt is alert, awake, not in acute distress Cardiovascular: RRR, S1/S2 +, no rubs, no gallops Respiratory: CTA bilaterally, no wheezing, no rhonchi Abdominal: Soft, NT, ND, bowel sounds + Extremities: no edema, no cyanosis  Discharge Instructions  Discharge Instructions     Discharge instructions   Complete by: As directed    Please call call MD or return to ER for similar or worsening recurring problem that brought you to hospital or if any  fever,nausea/vomiting,abdominal pain, uncontrolled pain, chest pain,  shortness of breath or any other alarming symptoms.  Please follow-up your doctor as instructed in a week time and call the  office for appointment.  Please avoid alcohol, smoking, or any other illicit substance and maintain healthy habits including taking your regular medications as prescribed.  You were cared for by a hospitalist during your hospital stay. If you have any questions about your discharge medications or the care you received while you were in the hospital after you are discharged, you can call the unit and ask to speak with the hospitalist on call if the hospitalist that took care of you is not available.  Once you are discharged, your primary care physician will handle any further medical issues. Please note that NO REFILLS for any discharge medications will be authorized once you are discharged, as it is imperative that you return to your primary care physician (or establish a relationship with a primary care physician if you do not have one) for your aftercare needs so that they can reassess your need for medications and monitor your lab values   You will need to follow-up with vascular surgery regarding peripheral vascular disease on your right lower extremitym please call the office   Increase activity slowly   Complete by: As directed       Allergies as of 05/10/2022       Reactions   Metformin And Related Cough   Zestril [lisinopril] Cough        Medication List     STOP taking these medications    Anacin 400-32 MG Tabs Generic drug: Aspirin-Caffeine       TAKE these medications    aspirin EC 81 MG tablet Take 1 tablet (81 mg total) by mouth daily. Swallow whole.   levothyroxine 100 MCG tablet Commonly known as: SYNTHROID Take 1 tablet (100 mcg total) by mouth daily before breakfast.   losartan 50 MG tablet Commonly known as: COZAAR Take 50 mg by mouth daily.   metoprolol succinate 25 MG 24 hr tablet Commonly known as: Toprol XL Take 1 tablet (25 mg total) by mouth daily.   multivitamin with minerals Tabs tablet Take 1 tablet by mouth daily with lunch.    pantoprazole 40 MG tablet Commonly known as: PROTONIX Take 1 tablet (40 mg total) by mouth daily. Start taking on: May 11, 2022   rosuvastatin 20 MG tablet Commonly known as: CRESTOR Take 1 tablet (20 mg total) by mouth daily.        Follow-up Information     Stechschulte, Hyman Hopes, MD Follow up in 1 month(s).   Specialty: Surgery Contact information: [address] [place] [address] [phone]         Farris Has, MD Follow up in 1 week(s).   Specialty: Family Medicine Contact information: [address] [place] [address] [phone]         Hillis Range, MD .   Specialty: Cardiology Contact information: [address] [place] [address] [phone]         Chuck Hint, MD Follow up in 1 week(s).   Specialties: Vascular Surgery, Cardiology Why: You will need to follow-up with vascular surgery regarding peripheral vascular disease on your right lower extremitym please call the office Contact information: [address] [place] [address] [phone]                Allergies  Allergen Reactions  Metformin And Related Cough   Zestril [Lisinopril] Cough    The results of significant diagnostics from this hospitalization (including imaging, microbiology, ancillary and laboratory) are listed below for reference.    Microbiology: No results found for this or any previous visit (from the past 240 hour(s)).  Procedures/Studies: DG Abd Portable 1V  Result Date: 05/10/2022 CLINICAL DATA:  Small-bowel obstruction EXAM: PORTABLE ABDOMEN - 1 VIEW COMPARISON:  Portable exam 0542 hours compared to 05/08/2022 FINDINGS: Few dilated small bowel loops in the LEFT mid abdomen, decreased in caliber since prior exam. Oral contrast has progressed through to nondistended colon. No bowel wall thickening. Levoconvex lumbar scoliosis. IMPRESSION: Decreased small bowel distension since prior  exam. Electronically Signed   By: Ulyses Southward M.D.   On: 05/10/2022 08:34   Inpatient: VAS [place] PAD ABI  Result Date: 05/09/2022  LOWER EXTREMITY DOPPLER STUDY Patient Name:  Kristina Gates  Date of Exam:   05/09/2022 Medical Rec #: [id number]        Accession #:    [id number] Date of Birth: 11-05-1956         Patient Gender: F Patient Age:   68 years Exam Location:  [facility] Procedure:      VAS [place] ABI WITH/WO TBI Referring Phys: DAVID ORTIZ --------------------------------------------------------------------------------  Indications: PAD (screening). High Risk Factors: Hypertension, hyperlipidemia, Diabetes, current smoker.  Comparison Study: No previous exams Performing Technologist: Hill, Jody RVT, RDMS  Examination Guidelines: A complete evaluation includes at minimum, Doppler waveform signals and systolic blood pressure reading at the level of bilateral brachial, anterior tibial, and posterior tibial arteries, when vessel segments are accessible. Bilateral testing is considered an integral part of a complete examination. Photoelectric Plethysmograph (PPG) waveforms and toe systolic pressure readings are included as required and additional duplex testing as needed. Limited examinations for reoccurring indications may be performed as noted.  ABI Findings: +---------+------------------+-----+----------+--------+ Right    Rt Pressure (mmHg)IndexWaveform  Comment  +---------+------------------+-----+----------+--------+ Brachial 127                    triphasic          +---------+------------------+-----+----------+--------+ PTA      47                0.37 monophasic         +---------+------------------+-----+----------+--------+ DP       47                0.37 monophasic         +---------+------------------+-----+----------+--------+ Great Toe11                0.09 Abnormal           +---------+------------------+-----+----------+--------+  +---------+------------------+-----+---------+-------+ Left     Lt Pressure (mmHg)IndexWaveform Comment +---------+------------------+-----+---------+-------+ Brachial 122                    triphasic        +---------+------------------+-----+---------+-------+ PTA      62                0.49 biphasic         +---------+------------------+-----+---------+-------+ DP       65                0.51 biphasic         +---------+------------------+-----+---------+-------+ Great Toe27                0.21 Abnormal         +---------+------------------+-----+---------+-------+ +-------+-----------+-----------+------------+------------+  ABI/TBIToday's ABIToday's TBIPrevious ABIPrevious TBI +-------+-----------+-----------+------------+------------+ Right  0/37       0.09                                +-------+-----------+-----------+------------+------------+ Left   0.51       0.21                                +-------+-----------+-----------+------------+------------+  Summary: Right: Resting right ankle-brachial index indicates severe right lower extremity arterial disease. The right toe-brachial index is abnormal. Left: Resting left ankle-brachial index indicates moderate left lower extremity arterial disease. The left toe-brachial index is abnormal. *See table(s) above for measurements and observations  Electronically signed by Sherald Hess MD on 05/09/2022 at 7:45:34 PM.    Final    CT ABDOMEN PELVIS WO CONTRAST  Result Date: 05/09/2022 CLINICAL DATA:  66 year old female with abdominal pain. No IV access, oral contrast only. EXAM: CT ABDOMEN AND PELVIS WITHOUT CONTRAST TECHNIQUE: Multidetector CT imaging of the abdomen and pelvis was performed following the standard protocol without IV contrast. RADIATION DOSE REDUCTION: This exam was performed according to the departmental dose-optimization program which includes automated exposure control, adjustment of the mA  and/or kV according to patient size and/or use of iterative reconstruction technique. COMPARISON:  CT Chest, Abdomen, and Pelvis 01/21/2021. FINDINGS: Lower chest: Chronic lung base scarring and atelectasis, similar but increased compared to last year. No pericardial or pleural effusion. No convincing pneumonia. Hepatobiliary: Noncontrast liver and gallbladder remain within normal limits. Pancreas: Within normal limits. Spleen: Within normal limits. Adrenals/Urinary Tract: Within normal limits, chronic right renal midpole cyst (no follow-up imaging recommended). Stomach/Bowel: Oral contrast was administered and distends the stomach, duodenum, and multiple small bowel loops. Chronic mildly complex fat containing paraumbilical hernia now contains a small volume of free fluid. And there is a chronic left lower abdominal hernia inferior to that which now appears contain a dilated and fluid-filled small bowel loops. Appendix remains normal on series 5, image 81. The terminal ileum appears decompressed, and there are some decompressed small bowel loops in the central mesentery at the pelvic inlet. There are also some decompressed small bowel loops in the posterior left upper quadrant. No clear intra-abdominal transition point is identified. No pneumoperitoneum. No free fluid within the abdomen. Large bowel also diffusely mildly dilated with fluid to the level of the rectum. Vascular/Lymphatic: Extensive Aortoiliac calcified atherosclerosis. Normal caliber abdominal aorta. Vascular patency is not evaluated in the absence of IV contrast. No lymphadenopathy identified. Reproductive: Surgically absent uterus. Diminutive or absent ovaries. Other: No pelvic free fluid. Musculoskeletal: Chronic lower rib fractures. L1 compression fracture is chronic with loss of height since last year. No significant retropulsion. Chronic L4-L5 spondylolisthesis and facet arthropathy. No acute osseous abnormality identified. IMPRESSION: 1.  Diffusely dilated and fluid-filled small bowel loops suspicious for Acute Small Bowel Obstruction. Point of obstruction might be a chronic left lower abdomen ventral hernia which now seems to contain a fluid-filled small bowel loop. Recommend NG tube decompression, and repeat CT Abdomen and Pelvis might be valuable once the small bowel is less dilated. 2. Additionally, the colon is fluid-filled and mildly distended throughout. This suggests enteritis and/or diarrhea. But there seem to be intervening decompressed distal small bowel loops between #1 and the large bowel, corroborating a small-bowel obstruction. 3. No free air identified.  Small if any free fluid volume. Electronically  Signed   By: Odessa FlemingH  Hall M.D.   On: 05/09/2022 07:32   DG Abdomen 1 View  Result Date: 05/08/2022 CLINICAL DATA:  Abdominal pain. EXAM: ABDOMEN - 1 VIEW COMPARISON:  Abdominal radiograph dated 05/11/2021. FINDINGS: Evaluation is limited due to positioning and body habitus. There is an air-filled loop of bowel in the mid abdomen measuring approximately 5 cm in caliber. Additional mildly air distended small bowel loops noted in the lower abdomen. Findings concerning for obstruction versus ileus. Clinical correlation is recommended. There are changes of the spine. No acute osseous pathology. IMPRESSION: Findings concerning for obstruction versus ileus. Electronically Signed   By: Elgie CollardArash  Radparvar M.D.   On: 05/08/2022 23:56    Labs: BNP (last 3 results) No results for input(s): "BNP" in the last 8760 hours. Basic Metabolic Panel: Recent Labs  Lab 05/08/22 2245 05/08/22 2319 05/10/22 0530  NA 139 138 142  K 3.3* 3.3* 4.3  CL 98 96* 114*  CO2 28  --  22  GLUCOSE 145* 142* 69*  BUN 21 20 13   CREATININE 0.79 0.80 0.70  CALCIUM 11.2*  --  9.1  MG 1.9  --   --   PHOS 4.1  --   --    Liver Function Tests: Recent Labs  Lab 05/08/22 2245 05/10/22 0530  AST 31 24  ALT 28 19  ALKPHOS 72 52  BILITOT 0.6 0.9  PROT 8.0 6.3*   ALBUMIN 4.6 3.4*   Recent Labs  Lab 05/08/22 2245  LIPASE 34   No results for input(s): "AMMONIA" in the last 168 hours. CBC: Recent Labs  Lab 05/08/22 2245 05/08/22 2319 05/10/22 0530  WBC 12.1*  --  5.0  NEUTROABS 10.2*  --   --   HGB 15.8* 15.6* 12.7  HCT 47.1* 46.0 39.4  MCV 92.7  --  95.6  PLT 193  --  135*   Cardiac Enzymes: No results for input(s): "CKTOTAL", "CKMB", "CKMBINDEX", "TROPONINI" in the last 168 hours. BNP: Invalid input(s): "POCBNP" CBG: Recent Labs  Lab 05/09/22 2335 05/10/22 0411 05/10/22 0737 05/10/22 0806 05/10/22 1109  GLUCAP 71 78 63* 135* 73   D-Dimer No results for input(s): "DDIMER" in the last 72 hours. Hgb A1c No results for input(s): "HGBA1C" in the last 72 hours. Lipid Profile No results for input(s): "CHOL", "HDL", "LDLCALC", "TRIG", "CHOLHDL", "LDLDIRECT" in the last 72 hours. Thyroid function studies No results for input(s): "TSH", "T4TOTAL", "T3FREE", "THYROIDAB" in the last 72 hours.  Invalid input(s): "FREET3" Anemia work up No results for input(s): "VITAMINB12", "FOLATE", "FERRITIN", "TIBC", "IRON", "RETICCTPCT" in the last 72 hours. Urinalysis    Component Value Date/Time   COLORURINE AMBER (A) 05/08/2022 2211   APPEARANCEUR HAZY (A) 05/08/2022 2211   LABSPEC 1.024 05/08/2022 2211   PHURINE 5.0 05/08/2022 2211   GLUCOSEU NEGATIVE 05/08/2022 2211   HGBUR NEGATIVE 05/08/2022 2211   BILIRUBINUR NEGATIVE 05/08/2022 2211   KETONESUR NEGATIVE 05/08/2022 2211   PROTEINUR 30 (A) 05/08/2022 2211   NITRITE NEGATIVE 05/08/2022 2211   LEUKOCYTESUR TRACE (A) 05/08/2022 2211   Sepsis Labs Recent Labs  Lab 05/08/22 2245 05/10/22 0530  WBC 12.1* 5.0   Microbiology No results found for this or any previous visit (from the past 240 hour(s)).   Time coordinating discharge: 25 minutes  SIGNED: Lanae Boastamesh Jishnu Jenniges, MD  [facility] 05/10/2022, 3:28 PM  If 7PM-7AM, please contact night-coverage [email or web address]

## 2022-05-10 NOTE — Progress Notes (Signed)
Subjective/Chief Complaint: Having flatus, no stool today   Objective: Vital signs in last 24 hours: Temp:  [98.2 F (36.8 C)-98.7 F (37.1 C)] 98.6 F (37 C) (06/20 0409) Pulse Rate:  [45-86] 65 (06/20 0409) Resp:  [14-35] 18 (06/20 0409) BP: (100-128)/(56-90) 104/63 (06/20 1004) SpO2:  [79 %-100 %] 96 % (06/20 0409) Last BM Date : 05/09/22  Intake/Output from previous day: No intake/output data recorded. Intake/Output this shift: No intake/output data recorded.  GI: soft nontender hernia is easily reducible  Lab Results:  Recent Labs    05/08/22 2245 05/08/22 2319 05/10/22 0530  WBC 12.1*  --  5.0  HGB 15.8* 15.6* 12.7  HCT 47.1* 46.0 39.4  PLT 193  --  135*   BMET Recent Labs    05/08/22 2245 05/08/22 2319 05/10/22 0530  NA 139 138 142  K 3.3* 3.3* 4.3  CL 98 96* 114*  CO2 28  --  22  GLUCOSE 145* 142* 69*  BUN 21 20 13   CREATININE 0.79 0.80 0.70  CALCIUM 11.2*  --  9.1   PT/INR No results for input(s): "LABPROT", "INR" in the last 72 hours. ABG No results for input(s): "PHART", "HCO3" in the last 72 hours.  Invalid input(s): "PCO2", "PO2"  Studies/Results: DG Abd Portable 1V  Result Date: 05/10/2022 CLINICAL DATA:  Small-bowel obstruction EXAM: PORTABLE ABDOMEN - 1 VIEW COMPARISON:  Portable exam 0542 hours compared to 05/08/2022 FINDINGS: Few dilated small bowel loops in the LEFT mid abdomen, decreased in caliber since prior exam. Oral contrast has progressed through to nondistended colon. No bowel wall thickening. Levoconvex lumbar scoliosis. IMPRESSION: Decreased small bowel distension since prior exam. Electronically Signed   By: 05/10/2022 M.D.   On: 05/10/2022 08:34   Inpatient: VAS 05/12/2022 PAD ABI  Result Date: 05/09/2022  LOWER EXTREMITY DOPPLER STUDY Patient Name:  Kristina Gates  Date of Exam:   05/09/2022 Medical Rec #: [id number]        Accession #:    [id number] Date of Birth: [birth year]         Patient Gender: F Patient Age:   66 years  Exam Location:  [facility] Procedure:      [facility] ABI WITH/WO TBI Referring Phys: DAVID ORTIZ --------------------------------------------------------------------------------  Indications: PAD (screening). High Risk Factors: Hypertension, hyperlipidemia, Diabetes, current smoker.  Comparison Study: No previous exams Performing Technologist: Hill, Jody RVT, RDMS  Examination Guidelines: A complete evaluation includes at minimum, Doppler waveform signals and systolic blood pressure reading at the level of bilateral brachial, anterior tibial, and posterior tibial arteries, when vessel segments are accessible. Bilateral testing is considered an integral part of a complete examination. Photoelectric Plethysmograph (PPG) waveforms and toe systolic pressure readings are included as required and additional duplex testing as needed. Limited examinations for reoccurring indications may be performed as noted.  ABI Findings: +---------+------------------+-----+----------+--------+ Right    Rt Pressure (mmHg)IndexWaveform  Comment  +---------+------------------+-----+----------+--------+ Brachial 127                    triphasic          +---------+------------------+-----+----------+--------+ PTA      47                0.37 monophasic         +---------+------------------+-----+----------+--------+ DP       47                0.37 monophasic         +---------+------------------+-----+----------+--------+  Great Toe11                0.09 Abnormal           +---------+------------------+-----+----------+--------+ +---------+------------------+-----+---------+-------+ Left     Lt Pressure (mmHg)IndexWaveform Comment +---------+------------------+-----+---------+-------+ Brachial 122                    triphasic        +---------+------------------+-----+---------+-------+ PTA      62                0.49 biphasic         +---------+------------------+-----+---------+-------+  DP       65                0.51 biphasic         +---------+------------------+-----+---------+-------+ Great Toe27                0.21 Abnormal         +---------+------------------+-----+---------+-------+ +-------+-----------+-----------+------------+------------+ ABI/TBIToday's ABIToday's TBIPrevious ABIPrevious TBI +-------+-----------+-----------+------------+------------+ Right  0/37       0.09                                +-------+-----------+-----------+------------+------------+ Left   0.51       0.21                                +-------+-----------+-----------+------------+------------+  Summary: Right: Resting right ankle-brachial index indicates severe right lower extremity arterial disease. The right toe-brachial index is abnormal. Left: Resting left ankle-brachial index indicates moderate left lower extremity arterial disease. The left toe-brachial index is abnormal. *See table(s) above for measurements and observations  Electronically signed by Sherald Hess MD on 05/09/2022 at 7:45:34 PM.    Final    CT ABDOMEN PELVIS WO CONTRAST  Result Date: 05/09/2022 CLINICAL DATA:  66 year old female with abdominal pain. No IV access, oral contrast only. EXAM: CT ABDOMEN AND PELVIS WITHOUT CONTRAST TECHNIQUE: Multidetector CT imaging of the abdomen and pelvis was performed following the standard protocol without IV contrast. RADIATION DOSE REDUCTION: This exam was performed according to the departmental dose-optimization program which includes automated exposure control, adjustment of the mA and/or kV according to patient size and/or use of iterative reconstruction technique. COMPARISON:  CT Chest, Abdomen, and Pelvis 01/21/2021. FINDINGS: Lower chest: Chronic lung base scarring and atelectasis, similar but increased compared to last year. No pericardial or pleural effusion. No convincing pneumonia. Hepatobiliary: Noncontrast liver and gallbladder remain within normal  limits. Pancreas: Within normal limits. Spleen: Within normal limits. Adrenals/Urinary Tract: Within normal limits, chronic right renal midpole cyst (no follow-up imaging recommended). Stomach/Bowel: Oral contrast was administered and distends the stomach, duodenum, and multiple small bowel loops. Chronic mildly complex fat containing paraumbilical hernia now contains a small volume of free fluid. And there is a chronic left lower abdominal hernia inferior to that which now appears contain a dilated and fluid-filled small bowel loops. Appendix remains normal on series 5, image 81. The terminal ileum appears decompressed, and there are some decompressed small bowel loops in the central mesentery at the pelvic inlet. There are also some decompressed small bowel loops in the posterior left upper quadrant. No clear intra-abdominal transition point is identified. No pneumoperitoneum. No free fluid within the abdomen. Large bowel also diffusely mildly dilated with fluid to the level of the rectum. Vascular/Lymphatic: Extensive Aortoiliac calcified atherosclerosis. Normal  caliber abdominal aorta. Vascular patency is not evaluated in the absence of IV contrast. No lymphadenopathy identified. Reproductive: Surgically absent uterus. Diminutive or absent ovaries. Other: No pelvic free fluid. Musculoskeletal: Chronic lower rib fractures. L1 compression fracture is chronic with loss of height since last year. No significant retropulsion. Chronic L4-L5 spondylolisthesis and facet arthropathy. No acute osseous abnormality identified. IMPRESSION: 1. Diffusely dilated and fluid-filled small bowel loops suspicious for Acute Small Bowel Obstruction. Point of obstruction might be a chronic left lower abdomen ventral hernia which now seems to contain a fluid-filled small bowel loop. Recommend NG tube decompression, and repeat CT Abdomen and Pelvis might be valuable once the small bowel is less dilated. 2. Additionally, the colon is  fluid-filled and mildly distended throughout. This suggests enteritis and/or diarrhea. But there seem to be intervening decompressed distal small bowel loops between #1 and the large bowel, corroborating a small-bowel obstruction. 3. No free air identified.  Small if any free fluid volume. Electronically Signed   By: Genevie Ann M.D.   On: 05/09/2022 07:32   DG Abdomen 1 View  Result Date: 05/08/2022 CLINICAL DATA:  Abdominal pain. EXAM: ABDOMEN - 1 VIEW COMPARISON:  Abdominal radiograph dated 05/11/2021. FINDINGS: Evaluation is limited due to positioning and body habitus. There is an air-filled loop of bowel in the mid abdomen measuring approximately 5 cm in caliber. Additional mildly air distended small bowel loops noted in the lower abdomen. Findings concerning for obstruction versus ileus. Clinical correlation is recommended. There are changes of the spine. No acute osseous pathology. IMPRESSION: Findings concerning for obstruction versus ileus. Electronically Signed   By: Anner Crete M.D.   On: 05/08/2022 23:56    Anti-infectives: Anti-infectives (From admission, onward)    None       Assessment/Plan: SBO vs enteritis -fulls and if tolerates can go home later -I dont think there is sbo from this easily reducible hernia although I think elective repair is indicated and we can have her come back to office to discuss this  I reviewed hospitalist notes, last 24 h vitals and pain scores, last 48 h intake and output, last 24 h labs and trends, and last 24 h imaging results.  This care required straight-forward level of medical decision making.     Rolm Bookbinder 05/10/2022

## 2022-05-15 ENCOUNTER — Other Ambulatory Visit: Payer: Self-pay

## 2022-05-15 DIAGNOSIS — I872 Venous insufficiency (chronic) (peripheral): Secondary | ICD-10-CM

## 2022-05-16 DIAGNOSIS — D72829 Elevated white blood cell count, unspecified: Secondary | ICD-10-CM | POA: Diagnosis not present

## 2022-05-16 DIAGNOSIS — I739 Peripheral vascular disease, unspecified: Secondary | ICD-10-CM | POA: Diagnosis not present

## 2022-05-16 DIAGNOSIS — E876 Hypokalemia: Secondary | ICD-10-CM | POA: Diagnosis not present

## 2022-05-16 DIAGNOSIS — Z1211 Encounter for screening for malignant neoplasm of colon: Secondary | ICD-10-CM | POA: Diagnosis not present

## 2022-05-16 DIAGNOSIS — K56609 Unspecified intestinal obstruction, unspecified as to partial versus complete obstruction: Secondary | ICD-10-CM | POA: Diagnosis not present

## 2022-05-16 DIAGNOSIS — Z09 Encounter for follow-up examination after completed treatment for conditions other than malignant neoplasm: Secondary | ICD-10-CM | POA: Diagnosis not present

## 2022-05-16 DIAGNOSIS — K439 Ventral hernia without obstruction or gangrene: Secondary | ICD-10-CM | POA: Diagnosis not present

## 2022-06-03 DIAGNOSIS — R221 Localized swelling, mass and lump, neck: Secondary | ICD-10-CM | POA: Diagnosis not present

## 2022-06-03 DIAGNOSIS — Z8719 Personal history of other diseases of the digestive system: Secondary | ICD-10-CM | POA: Diagnosis not present

## 2022-06-03 DIAGNOSIS — E785 Hyperlipidemia, unspecified: Secondary | ICD-10-CM | POA: Diagnosis not present

## 2022-06-03 DIAGNOSIS — E1169 Type 2 diabetes mellitus with other specified complication: Secondary | ICD-10-CM | POA: Diagnosis not present

## 2022-06-03 DIAGNOSIS — E039 Hypothyroidism, unspecified: Secondary | ICD-10-CM | POA: Diagnosis not present

## 2022-06-03 DIAGNOSIS — I10 Essential (primary) hypertension: Secondary | ICD-10-CM | POA: Diagnosis not present

## 2022-06-03 DIAGNOSIS — K219 Gastro-esophageal reflux disease without esophagitis: Secondary | ICD-10-CM | POA: Diagnosis not present

## 2022-06-03 DIAGNOSIS — I739 Peripheral vascular disease, unspecified: Secondary | ICD-10-CM | POA: Diagnosis not present

## 2022-06-03 DIAGNOSIS — Z720 Tobacco use: Secondary | ICD-10-CM | POA: Diagnosis not present

## 2022-06-09 ENCOUNTER — Other Ambulatory Visit: Payer: Self-pay

## 2022-06-09 ENCOUNTER — Encounter (HOSPITAL_BASED_OUTPATIENT_CLINIC_OR_DEPARTMENT_OTHER): Payer: Self-pay

## 2022-06-09 DIAGNOSIS — R110 Nausea: Secondary | ICD-10-CM | POA: Diagnosis not present

## 2022-06-09 DIAGNOSIS — R1084 Generalized abdominal pain: Secondary | ICD-10-CM | POA: Diagnosis not present

## 2022-06-09 DIAGNOSIS — E119 Type 2 diabetes mellitus without complications: Secondary | ICD-10-CM | POA: Insufficient documentation

## 2022-06-09 DIAGNOSIS — I10 Essential (primary) hypertension: Secondary | ICD-10-CM | POA: Diagnosis not present

## 2022-06-09 DIAGNOSIS — K439 Ventral hernia without obstruction or gangrene: Secondary | ICD-10-CM | POA: Insufficient documentation

## 2022-06-09 DIAGNOSIS — I959 Hypotension, unspecified: Secondary | ICD-10-CM | POA: Diagnosis not present

## 2022-06-09 DIAGNOSIS — Z79899 Other long term (current) drug therapy: Secondary | ICD-10-CM | POA: Insufficient documentation

## 2022-06-09 DIAGNOSIS — K56609 Unspecified intestinal obstruction, unspecified as to partial versus complete obstruction: Secondary | ICD-10-CM | POA: Diagnosis not present

## 2022-06-09 DIAGNOSIS — K436 Other and unspecified ventral hernia with obstruction, without gangrene: Secondary | ICD-10-CM | POA: Diagnosis not present

## 2022-06-09 LAB — COMPREHENSIVE METABOLIC PANEL
ALT: 19 U/L (ref 0–44)
AST: 39 U/L (ref 15–41)
Albumin: 5.3 g/dL — ABNORMAL HIGH (ref 3.5–5.0)
Alkaline Phosphatase: 76 U/L (ref 38–126)
Anion gap: 13 (ref 5–15)
BUN: 17 mg/dL (ref 8–23)
CO2: 24 mmol/L (ref 22–32)
Calcium: 11.6 mg/dL — ABNORMAL HIGH (ref 8.9–10.3)
Chloride: 98 mmol/L (ref 98–111)
Creatinine, Ser: 0.85 mg/dL (ref 0.44–1.00)
GFR, Estimated: >60 mL/min (ref >60)
Glucose, Bld: 179 mg/dL — ABNORMAL HIGH (ref 70–99)
Potassium: 5.3 mmol/L — ABNORMAL HIGH (ref 3.5–5.1)
Sodium: 135 mmol/L (ref 135–145)
Total Bilirubin: 0.6 mg/dL (ref 0.3–1.2)
Total Protein: 8.8 g/dL — ABNORMAL HIGH (ref 6.5–8.1)

## 2022-06-09 LAB — LIPASE, BLOOD: Lipase: 24 U/L (ref 11–51)

## 2022-06-09 LAB — CBC
HCT: 48.4 % — ABNORMAL HIGH (ref 36.0–46.0)
Hemoglobin: 16.4 g/dL — ABNORMAL HIGH (ref 12.0–15.0)
MCH: 30.9 pg (ref 26.0–34.0)
MCHC: 33.9 g/dL (ref 30.0–36.0)
MCV: 91.1 fL (ref 80.0–100.0)
Platelets: 187 10*3/uL (ref 150–400)
RBC: 5.31 MIL/uL — ABNORMAL HIGH (ref 3.87–5.11)
RDW: 12.9 % (ref 11.5–15.5)
WBC: 11 10*3/uL — ABNORMAL HIGH (ref 4.0–10.5)
nRBC: 0 % (ref 0.0–0.2)

## 2022-06-09 MED ORDER — ONDANSETRON 4 MG PO TBDP
4.0000 mg | ORAL_TABLET | Freq: Once | ORAL | Status: AC | PRN
  Administered 2022-06-09: 4 mg via ORAL
  Filled 2022-06-09: qty 1

## 2022-06-09 NOTE — ED Triage Notes (Signed)
Pt brought in by [facility] Abd pain since 10am Began vomiting in triage, brown in color Last BM 06/07/22 Dc'd from hospital recently in June for the same Scheduled gastroenterologist 06/21/22

## 2022-06-10 ENCOUNTER — Emergency Department (HOSPITAL_BASED_OUTPATIENT_CLINIC_OR_DEPARTMENT_OTHER): Payer: Medicare HMO

## 2022-06-10 ENCOUNTER — Emergency Department (HOSPITAL_BASED_OUTPATIENT_CLINIC_OR_DEPARTMENT_OTHER)
Admission: EM | Admit: 2022-06-10 | Discharge: 2022-06-10 | Disposition: A | Discharge status: Home / Self Care | Payer: Medicare HMO | Attending: Emergency Medicine | Admitting: Emergency Medicine

## 2022-06-10 DIAGNOSIS — K436 Other and unspecified ventral hernia with obstruction, without gangrene: Secondary | ICD-10-CM

## 2022-06-10 DIAGNOSIS — N281 Cyst of kidney, acquired: Secondary | ICD-10-CM | POA: Diagnosis not present

## 2022-06-10 DIAGNOSIS — K6389 Other specified diseases of intestine: Secondary | ICD-10-CM | POA: Diagnosis not present

## 2022-06-10 DIAGNOSIS — K56609 Unspecified intestinal obstruction, unspecified as to partial versus complete obstruction: Secondary | ICD-10-CM

## 2022-06-10 MED ORDER — MORPHINE SULFATE (PF) 4 MG/ML IV SOLN
4.0000 mg | Freq: Once | INTRAVENOUS | Status: DC
  Filled 2022-06-10: qty 1

## 2022-06-10 MED ORDER — KETOROLAC TROMETHAMINE 15 MG/ML IJ SOLN
15.0000 mg | Freq: Once | INTRAMUSCULAR | Status: AC
  Administered 2022-06-10: 15 mg via INTRAVENOUS
  Filled 2022-06-10: qty 1

## 2022-06-10 MED ORDER — IOHEXOL 300 MG/ML  SOLN
75.0000 mL | Freq: Once | INTRAMUSCULAR | Status: AC | PRN
  Administered 2022-06-10: 75 mL via INTRAVENOUS

## 2022-06-10 MED ORDER — ONDANSETRON HCL 4 MG/2ML IJ SOLN
4.0000 mg | Freq: Once | INTRAMUSCULAR | Status: DC
  Filled 2022-06-10: qty 2

## 2022-06-10 MED ORDER — SODIUM CHLORIDE 0.9 % IV BOLUS
1000.0000 mL | Freq: Once | INTRAVENOUS | Status: AC
  Administered 2022-06-10: 1000 mL via INTRAVENOUS

## 2022-06-10 NOTE — Discharge Instructions (Signed)
Follow-up with [facility].  Their contact information has been provided in this discharge summary for you to call and make these arrangements.  Dr. Janee Morn is aware of your situation and when she was seen in the next 3 to 4 days.  Return to the emergency department if you develop worsening abdominal pain, high fevers, bloody stools, or for other new and concerning symptoms.

## 2022-06-10 NOTE — ED Provider Notes (Signed)
[facility] EMERGENCY DEPT Provider Note   CSN: [id number] Arrival date & time: 06/09/22  2151     History  Chief Complaint  Patient presents with   Abdominal Pain    Kristina Gates is a 66 y.o. female.  Patient is a 66 year old female with past medical history of diabetes, hypertension, hyperlipidemia, prior abdominal surgeries and known ventral hernia.  She was recently admitted for a small bowel obstruction last month.  This resolved without surgery.  Patient has been doing well until earlier this morning.  Patient describes abdominal pain that has worsened throughout the day.  It is crampy in nature and located throughout her abdomen.  She began vomiting in triage.  She reports having not had a bowel movement today.  She states that this feels similar to her prior small bowel obstruction  The history is provided by the patient.  Abdominal Pain Pain location:  Generalized Pain quality: cramping   Pain radiates to:  Does not radiate Pain severity:  Moderate Onset quality:  Gradual Timing:  Constant Progression:  Worsening Chronicity:  New      Home Medications Prior to Admission medications   Medication Sig Start Date End Date Taking? Authorizing Provider  levothyroxine (SYNTHROID) 100 MCG tablet Take 1 tablet (100 mcg total) by mouth daily before breakfast. 06/01/21   Medina-Vargas, Monina C, NP  losartan (COZAAR) 50 MG tablet Take 50 mg by mouth daily. 12/22/21   [provider]  metoprolol succinate (TOPROL XL) 25 MG 24 hr tablet Take 1 tablet (25 mg total) by mouth daily. 01/04/22   Graciella Freer, PA-C  Multiple Vitamin (MULTIVITAMIN WITH MINERALS) TABS tablet Take 1 tablet by mouth daily with lunch.    [provider]  pantoprazole (PROTONIX) 40 MG tablet Take 1 tablet (40 mg total) by mouth daily. 05/11/22 06/10/22  Lanae Boast, MD  rosuvastatin (CRESTOR) 20 MG tablet Take 1 tablet (20 mg total) by mouth daily. 06/01/21    Medina-Vargas, Monina C, NP      Allergies    Metformin and related and Zestril [lisinopril]    Review of Systems   Review of Systems  Gastrointestinal:  Positive for abdominal pain.  All other systems reviewed and are negative.   Physical Exam Updated Vital Signs BP (!) 154/75 (BP Location: Right Arm)   Pulse 75   Temp 97.8 F (36.6 C) (Oral)   Resp 18   Ht 5\' 5"  (1.651 m)   Wt 63.5 kg   SpO2 98%   BMI 23.30 kg/m  Physical Exam Vitals and nursing note reviewed.  Constitutional:      General: She is not in acute distress.    Appearance: She is well-developed. She is not diaphoretic.  HENT:     Head: Normocephalic and atraumatic.  Cardiovascular:     Rate and Rhythm: Normal rate and regular rhythm.     Heart sounds: No murmur heard.    No friction rub. No gallop.  Pulmonary:     Effort: Pulmonary effort is normal. No respiratory distress.     Breath sounds: Normal breath sounds. No wheezing.  Abdominal:     General: Bowel sounds are normal. There is no distension.     Palpations: Abdomen is soft.     Tenderness: There is generalized abdominal tenderness. There is no right CVA tenderness, left CVA tenderness, guarding or rebound.     Comments: There is a moderate-sized ventral hernia noted.  This is somewhat tender to the touch.  Musculoskeletal:        General: Normal range of motion.     Cervical back: Normal range of motion and neck supple.  Skin:    General: Skin is warm and dry.  Neurological:     General: No focal deficit present.     Mental Status: She is alert and oriented to person, place, and time.     ED Results / Procedures / Treatments   Labs (all labs ordered are listed, but only abnormal results are displayed) Labs Reviewed  COMPREHENSIVE METABOLIC PANEL - Abnormal; Notable for the following components:      Result Value   Potassium 5.3 (*)    Glucose, Bld 179 (*)    Calcium 11.6 (*)    Total Protein 8.8 (*)    Albumin 5.3 (*)    All other  components within normal limits  CBC - Abnormal; Notable for the following components:   WBC 11.0 (*)    RBC 5.31 (*)    Hemoglobin 16.4 (*)    HCT 48.4 (*)    All other components within normal limits  LIPASE, BLOOD  URINALYSIS, ROUTINE W REFLEX MICROSCOPIC    EKG None  Radiology No results found.  Procedures Procedures    Medications Ordered in ED Medications  sodium chloride 0.9 % bolus 1,000 mL (has no administration in time range)  ondansetron (ZOFRAN) injection 4 mg (has no administration in time range)  morphine (PF) 4 MG/ML injection 4 mg (has no administration in time range)  ondansetron (ZOFRAN-ODT) disintegrating tablet 4 mg (4 mg Oral Given 06/09/22 2205)    ED Course/ Medical Decision Making/ A&P  This patient presents to the ED for concern of abdominal pain and vomiting, this involves an extensive number of treatment options, and is a complaint that carries with it a high risk of complications and morbidity.  The differential diagnosis includes small bowel obstruction, acute cholecystitis, acute pancreatitis, diverticulitis   Co morbidities that complicate the patient evaluation  None   Additional history obtained:  No additional history or external records needed   Lab Tests:  I Ordered, and personally interpreted labs.  The pertinent results include: Unremarkable CBC and metabolic panel   Imaging Studies ordered:  I ordered imaging studies including CT scan of the abdomen and pelvis I independently visualized and interpreted imaging which showed small bowel obstruction originating from a strangulated ventral hernia I agree with the radiologist interpretation   Cardiac Monitoring: / EKG:  None performed   Consultations Obtained:  I requested consultation with the general surgeon, Dr. Janee Morn,  and discussed lab and imaging findings as well as pertinent plan - they recommend: Observation and p.o. challenge post hernia  reduction.   Problem List / ED Course / Critical interventions / Medication management  Patient with history of previous bowel obstruction last month due to an incarcerated ventral hernia presenting with abdominal pain, vomiting, and recurrent incarcerated ventral hernia.  This was successfully reduced manually by myself and patient is now feeling markedly improved.  She actually went to the bathroom and had a bowel movement without difficulty.  Patient seems appropriate with outpatient follow-up with general surgery. I ordered medication including Toradol for pain Reevaluation of the patient after these medicines showed that the patient resolved I have reviewed the patients home medicines and have made adjustments as needed   Social Determinants of Health:  None   Test / Admission - Considered:  Bowel obstruction has resolved since manual reduction of umbilical hernia.  Patient to be discharged with general surgery follow-up.  Final Clinical Impression(s) / ED Diagnoses Final diagnoses:  None    Rx / DC Orders ED Discharge Orders     None         Geoffery Lyons, MD 06/10/22 [phone]

## 2022-06-16 ENCOUNTER — Ambulatory Visit (HOSPITAL_COMMUNITY)
Admission: RE | Admit: 2022-06-16 | Discharge: 2022-06-16 | Disposition: A | Discharge status: Home / Self Care | Payer: Medicare HMO | Source: Ambulatory Visit | Attending: Vascular Surgery | Admitting: Vascular Surgery

## 2022-06-16 ENCOUNTER — Ambulatory Visit: Payer: Medicare HMO | Admitting: Physician Assistant

## 2022-06-16 ENCOUNTER — Encounter: Payer: Medicare HMO | Admitting: Vascular Surgery

## 2022-06-16 VITALS — BP 120/71 | HR 74 | Temp 97.7°F | Resp 14 | Ht 65.0 in | Wt 135.0 lb

## 2022-06-16 DIAGNOSIS — I872 Venous insufficiency (chronic) (peripheral): Secondary | ICD-10-CM

## 2022-06-16 DIAGNOSIS — I739 Peripheral vascular disease, unspecified: Secondary | ICD-10-CM | POA: Diagnosis not present

## 2022-06-16 NOTE — Progress Notes (Signed)
Office Note     CC:  follow up Requesting Provider:  Farris Has, MD  HPI: Kristina Gates is a 66 y.o. (04/29/1956) female who presents for evaluation of PAD as well as venous insufficiency of right lower extremity.  Patient admittedly has not been very mobile over the past year.  She does not appreciate any claudication symptoms of bilateral lower extremities she also denies any rest pain or tissue loss of bilateral lower extremities.  She is a cigarette smoker.  She has also had superficial veins with right ankle edema for as long as she can remember.  She does not wear compression or elevate her legs during the day.  She has no history of DVT or venous ulcerations.  She is unsure if she has family history of varicose veins.    Past Medical History:  Diagnosis Date   Diabetes mellitus with renal complications (HCC) 2022   AKI in context of rhabdo   Hypercholesteremia    Hypertension    Postprocedural hypothyroidism     Past Surgical History:  Procedure Laterality Date   COLON SURGERY      Social History   Socioeconomic History   Marital status: Single    Spouse name: Not on file   Number of children: Not on file   Years of education: Not on file   Highest education level: Not on file  Occupational History   Not on file  Tobacco Use   Smoking status: Some Days    Types: Cigarettes   Smokeless tobacco: Never  Vaping Use   Vaping Use: Never used  Substance and Sexual Activity   Alcohol use: Not Currently    Comment: occasional   Drug use: Never   Sexual activity: Not on file  Other Topics Concern   Not on file  Social History Narrative   Not on file   Social Determinants of Health   Financial Resource Strain: Not on file  Food Insecurity: Not on file  Transportation Needs: Not on file  Physical Activity: Not on file  Stress: Not on file  Social Connections: Not on file  Intimate Partner Violence: Not on file    Family History  Problem Relation Age of  Onset   Diabetes Mother    Hypertension Mother    Cancer Father    Stroke Father     Current Outpatient Medications  Medication Sig Dispense Refill   levothyroxine (SYNTHROID) 100 MCG tablet Take 1 tablet (100 mcg total) by mouth daily before breakfast. 30 tablet 0   losartan (COZAAR) 50 MG tablet Take 50 mg by mouth daily.     metoprolol succinate (TOPROL XL) 25 MG 24 hr tablet Take 1 tablet (25 mg total) by mouth daily. 90 tablet 1   Multiple Vitamin (MULTIVITAMIN WITH MINERALS) TABS tablet Take 1 tablet by mouth daily with lunch.     rosuvastatin (CRESTOR) 20 MG tablet Take 1 tablet (20 mg total) by mouth daily. 30 tablet 0   pantoprazole (PROTONIX) 40 MG tablet Take 1 tablet (40 mg total) by mouth daily. 30 tablet 0   No current facility-administered medications for this visit.    Allergies  Allergen Reactions   Metformin And Related Cough   Zestril [Lisinopril] Cough     REVIEW OF SYSTEMS:   [X]  denotes positive finding, [ ]  denotes negative finding Cardiac  Comments:  Chest pain or chest pressure:    Shortness of breath upon exertion:    Short of breath when lying flat:  Irregular heart rhythm:        Vascular    Pain in calf, thigh, or hip brought on by ambulation:    Pain in feet at night that wakes you up from your sleep:     Blood clot in your veins:    Leg swelling:         Pulmonary    Oxygen at home:    Productive cough:     Wheezing:         Neurologic    Sudden weakness in arms or legs:     Sudden numbness in arms or legs:     Sudden onset of difficulty speaking or slurred speech:    Temporary loss of vision in one eye:     Problems with dizziness:         Gastrointestinal    Blood in stool:     Vomited blood:         Genitourinary    Burning when urinating:     Blood in urine:        Psychiatric    Major depression:         Hematologic    Bleeding problems:    Problems with blood clotting too easily:        Skin    Rashes or  ulcers:        Constitutional    Fever or chills:      PHYSICAL EXAMINATION:  Vitals:   06/16/22 0838  BP: 120/71  Pulse: 74  Resp: 14  Temp: 97.7 F (36.5 C)  TempSrc: Temporal  Weight: 135 lb (61.2 kg)  Height: 5\' 5"  (1.651 m)    General:  WDWN in NAD; vital signs documented above Gait: Not observed HENT: WNL, normocephalic Pulmonary: normal non-labored breathing , without Rales, rhonchi,  wheezing Cardiac: regular HR Abdomen: soft, NT, no masses Skin: without rashes Vascular Exam/Pulses:  Right Left  Radial 2+ (normal) 2+ (normal)  Femoral 2+ (normal) 2+ (normal)  DP absent absent  PT absent absent   Extremities: without ischemic changes, without Gangrene , without cellulitis; without open wounds; spider veins of the right anterior and medial ankle Musculoskeletal: no muscle wasting or atrophy  Neurologic: A&O X 3;  No focal weakness or paresthesias are detected Psychiatric:  The pt has Normal affect.   Non-Invasive Vascular Imaging:   Right lower extremity venous reflux study negative for DVT.  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 female here for evaluation of PAD and possible right lower extremity venous insufficiency  -Patient does have some mild venous insufficiency of the right lower extremity based on venous reflux study.  Study was negative for DVT however did show some incompetency in the greater saphenous vein at the level of the knee and proximal calf.  Recommendations would include periodic elevation of the leg above the level of the heart as well as may be some light compression  however patient states her spider veins and swelling of her ankle are  not bothersome to her. -She was also evaluated for PAD.  On exam she has easily palpable femoral pulses indicating infrainguinal occlusive disease based on the ABIs.  Admittedly she has not been very mobile over the past year however she denies claudication, rest pain, or nonhealing wounds of bilateral lower extremities.  Recommendations included a walking program to promote collateral flow.  She should also work on smoking cessation.  We will repeat ABIs in 1 year.  She will return to office/call if she develops claudication, rest pain, or nonhealing wounds of bilateral lower extremities.   Emilie Rutter, PA-C [facility] [phone]  Clinic MD:   Edilia Bo

## 2022-06-24 ENCOUNTER — Other Ambulatory Visit: Payer: Self-pay | Admitting: Student

## 2022-06-29 ENCOUNTER — Ambulatory Visit: Payer: Self-pay | Admitting: Surgery

## 2022-06-29 DIAGNOSIS — R634 Abnormal weight loss: Secondary | ICD-10-CM | POA: Diagnosis not present

## 2022-06-29 DIAGNOSIS — K430 Incisional hernia with obstruction, without gangrene: Secondary | ICD-10-CM | POA: Diagnosis not present

## 2022-06-29 DIAGNOSIS — E119 Type 2 diabetes mellitus without complications: Secondary | ICD-10-CM | POA: Diagnosis not present

## 2022-06-29 DIAGNOSIS — F17200 Nicotine dependence, unspecified, uncomplicated: Secondary | ICD-10-CM | POA: Diagnosis not present

## 2022-06-30 NOTE — Progress Notes (Addendum)
COVID Vaccine Completed: yes x2  Date of COVID positive in last 90 days: no  PCP - Farris Has, MD Cardiologist - Hillis Range, MD  Chest x-ray - n/a EKG - 05/08/22 Epic Stress Test - n/a ECHO - 01/25/21 Epic Cardiac Cath - n/a Pacemaker/ICD device last checked: n/a Spinal Cord Stimulator: n/a  Bowel Prep - no  Sleep Study - n/a CPAP -   Fasting Blood Sugar - below 100 Checks Blood Sugar 1 times a day  Blood Thinner Instructions: Aspirin Instructions: ASA 81, hold day of Last Dose:  Activity level: Can go up a flight of stairs and perform activities of daily living without stopping and without symptoms of chest pain or shortness of breath.   Anesthesia review:   Patient denies shortness of breath, fever, cough and chest pain at PAT appointment  Patient verbalized understanding of instructions that were given to them at the PAT appointment. Patient was also instructed that they will need to review over the PAT instructions again at home before surgery.

## 2022-06-30 NOTE — Patient Instructions (Addendum)
SURGICAL WAITING ROOM VISITATION Patients having surgery or a procedure may have no more than 2 support people in the waiting area - these visitors may rotate.   Children under the age of 33 must have an adult with them who is not the patient. If the patient needs to stay at the hospital during part of their recovery, the visitor guidelines for inpatient rooms apply. Pre-op nurse will coordinate an appropriate time for 1 support person to accompany patient in pre-op.  This support person may not rotate.    Please refer to the [facility] website for the visitor guidelines for Inpatients (after your surgery is over and you are in a regular room).    Your procedure is scheduled on: 07/07/22   Report to [facility] Main Entrance    Report to admitting at 10:45 AM   Call this number if you have problems the morning of surgery [phone]   Do not eat food :After Midnight.   After Midnight you may have the following liquids until 10:00 AM DAY OF SURGERY  Water Non-Citrus Juices (without pulp, NO RED) Carbonated Beverages Black Coffee (NO MILK/CREAM OR CREAMERS, sugar ok)  Clear Tea (NO MILK/CREAM OR CREAMERS, sugar ok) regular and decaf                             Plain Jell-O (NO RED)                                           Fruit ices (not with fruit pulp, NO RED)                                     Popsicles (NO RED)                                                               Sports drinks like Gatorade (NO RED)              FOLLOW BOWEL PREP AND ANY ADDITIONAL PRE OP INSTRUCTIONS YOU RECEIVED FROM YOUR SURGEON'S OFFICE!!!     Oral Hygiene is also important to reduce your risk of infection.                                    Remember - BRUSH YOUR TEETH THE MORNING OF SURGERY WITH YOUR REGULAR TOOTHPASTE   Do NOT smoke after Midnight   Take these medicines the morning of surgery with A SIP OF WATER: Levothyroxine, Metoprolol, Pantoprazole, Rosuvastatin.   DO NOT TAKE  ANY ORAL DIABETIC MEDICATIONS DAY OF YOUR SURGERY  How to Manage Your Diabetes Before and After Surgery  Why is it important to control my blood sugar before and after surgery? Improving blood sugar levels before and after surgery helps healing and can limit problems. A way of improving blood sugar control is eating a healthy diet by:  Eating less sugar and carbohydrates  Increasing activity/exercise  Talking with your doctor about reaching your blood  sugar goals High blood sugars (greater than 180 mg/dL) can raise your risk of infections and slow your recovery, so you will need to focus on controlling your diabetes during the weeks before surgery. Make sure that the doctor who takes care of your diabetes knows about your planned surgery including the date and location.  How do I manage my blood sugar before surgery? Check your blood sugar at least 4 times a day, starting 2 days before surgery, to make sure that the level is not too high or low. Check your blood sugar the morning of your surgery when you wake up and every 2 hours until you get to the Short Stay unit. If your blood sugar is less than 70 mg/dL, you will need to treat for low blood sugar: Do not take insulin. Treat a low blood sugar (less than 70 mg/dL) with  cup of clear juice (cranberry or apple), 4 glucose tablets, OR glucose gel. Recheck blood sugar in 15 minutes after treatment (to make sure it is greater than 70 mg/dL). If your blood sugar is not greater than 70 mg/dL on recheck, call [phone] for further instructions. Report your blood sugar to the short stay nurse when you get to Short Stay.  If you are admitted to the hospital after surgery: Your blood sugar will be checked by the staff and you will probably be given insulin after surgery (instead of oral diabetes medicines) to make sure you have good blood sugar levels. The goal for blood sugar control after surgery is 80-180 mg/dL.   Reviewed and Endorsed by  [facility] Patient Education Committee, August 2015                               You may not have any metal on your body including hair pins, jewelry, and body piercing             Do not wear make-up, lotions, powders, perfumes, or deodorant  Do not wear nail polish including gel and S&S, artificial/acrylic nails, or any other type of covering on natural nails including finger and toenails. If you have artificial nails, gel coating, etc. that needs to be removed by a nail salon please have this removed prior to surgery or surgery may need to be canceled/ delayed if the surgeon/ anesthesia feels like they are unable to be safely monitored.   Do not shave  48 hours prior to surgery.    Do not bring valuables to the hospital. [facility] IS NOT             RESPONSIBLE   FOR VALUABLES.   Bring small overnight bag day of surgery.   DO NOT BRING YOUR HOME MEDICATIONS TO THE HOSPITAL. PHARMACY WILL DISPENSE MEDICATIONS LISTED ON YOUR MEDICATION LIST TO YOU DURING YOUR ADMISSION IN THE HOSPITAL!    Special Instructions: Bring a copy of your healthcare power of attorney and living will documents         the day of surgery if you haven't scanned them before.              Please read over the following fact sheets you were given: IF YOU HAVE QUESTIONS ABOUT YOUR PRE-OP INSTRUCTIONS PLEASE CALL [phone]- [facility] - Preparing for Surgery Before surgery, you can play an important role.  Because skin is not sterile, your skin needs to be as free of germs as  possible.  You can reduce the number of germs on your skin by washing with CHG (chlorahexidine gluconate) soap before surgery.  CHG is an antiseptic cleaner which kills germs and bonds with the skin to continue killing germs even after washing. Please DO NOT use if you have an allergy to CHG or antibacterial soaps.  If your skin becomes reddened/irritated stop using the CHG and inform your nurse when you arrive at Short Stay. Do not  shave (including legs and underarms) for at least 48 hours prior to the first CHG shower.  You may shave your face/neck.  Please follow these instructions carefully:  1.  Shower with CHG Soap the night before surgery and the  morning of surgery.  2.  If you choose to wash your hair, wash your hair first as usual with your normal  shampoo.  3.  After you shampoo, rinse your hair and body thoroughly to remove the shampoo.                             4.  Use CHG as you would any other liquid soap.  You can apply chg directly to the skin and wash.  Gently with a scrungie or clean washcloth.  5.  Apply the CHG Soap to your body ONLY FROM THE NECK DOWN.   Do   not use on face/ open                           Wound or open sores. Avoid contact with eyes, ears mouth and   genitals (private parts).                       Wash face,  Genitals (private parts) with your normal soap.             6.  Wash thoroughly, paying special attention to the area where your    surgery  will be performed.  7.  Thoroughly rinse your body with warm water from the neck down.  8.  DO NOT shower/wash with your normal soap after using and rinsing off the CHG Soap.                9.  Pat yourself dry with a clean towel.            10.  Wear clean pajamas.            11.  Place clean sheets on your bed the night of your first shower and do not  sleep with pets. Day of Surgery : Do not apply any lotions/deodorants the morning of surgery.  Please wear clean clothes to the hospital/surgery center.  FAILURE TO FOLLOW THESE INSTRUCTIONS MAY RESULT IN THE CANCELLATION OF YOUR SURGERY  PATIENT SIGNATURE_________________________________  NURSE SIGNATURE__________________________________  ________________________________________________________________________

## 2022-07-04 ENCOUNTER — Encounter (HOSPITAL_COMMUNITY)
Admission: RE | Admit: 2022-07-04 | Discharge: 2022-07-04 | Disposition: A | Discharge status: Home / Self Care | Payer: Medicare HMO | Source: Ambulatory Visit | Attending: Surgery | Admitting: Surgery

## 2022-07-04 ENCOUNTER — Encounter (HOSPITAL_COMMUNITY): Payer: Self-pay

## 2022-07-04 VITALS — BP 129/65 | HR 67 | Temp 98.4°F | Resp 12 | Ht 64.0 in | Wt 135.6 lb

## 2022-07-04 DIAGNOSIS — Z01812 Encounter for preprocedural laboratory examination: Secondary | ICD-10-CM | POA: Insufficient documentation

## 2022-07-04 DIAGNOSIS — I10 Essential (primary) hypertension: Secondary | ICD-10-CM | POA: Insufficient documentation

## 2022-07-04 DIAGNOSIS — E11649 Type 2 diabetes mellitus with hypoglycemia without coma: Secondary | ICD-10-CM | POA: Diagnosis not present

## 2022-07-04 HISTORY — DX: Unspecified osteoarthritis, unspecified site: M19.90

## 2022-07-04 HISTORY — DX: Gastro-esophageal reflux disease without esophagitis: K21.9

## 2022-07-04 LAB — BASIC METABOLIC PANEL
Anion gap: 7 (ref 5–15)
BUN: 18 mg/dL (ref 8–23)
CO2: 25 mmol/L (ref 22–32)
Calcium: 9.8 mg/dL (ref 8.9–10.3)
Chloride: 105 mmol/L (ref 98–111)
Creatinine, Ser: 0.66 mg/dL (ref 0.44–1.00)
GFR, Estimated: >60 mL/min (ref >60)
Glucose, Bld: 94 mg/dL (ref 70–99)
Potassium: 3.7 mmol/L (ref 3.5–5.1)
Sodium: 137 mmol/L (ref 135–145)

## 2022-07-04 LAB — CBC
HCT: 42.5 % (ref 36.0–46.0)
Hemoglobin: 13.9 g/dL (ref 12.0–15.0)
MCH: 30.7 pg (ref 26.0–34.0)
MCHC: 32.7 g/dL (ref 30.0–36.0)
MCV: 93.8 fL (ref 80.0–100.0)
Platelets: 186 10*3/uL (ref 150–400)
RBC: 4.53 MIL/uL (ref 3.87–5.11)
RDW: 12.6 % (ref 11.5–15.5)
WBC: 4.8 10*3/uL (ref 4.0–10.5)
nRBC: 0 % (ref 0.0–0.2)

## 2022-07-04 LAB — HEMOGLOBIN A1C
Hgb A1c MFr Bld: 5.6 % (ref 4.8–5.6)
Mean Plasma Glucose: 114.02 mg/dL

## 2022-07-04 LAB — GLUCOSE, CAPILLARY: Glucose-Capillary: 95 mg/dL (ref 70–99)

## 2022-07-07 ENCOUNTER — Other Ambulatory Visit: Payer: Self-pay

## 2022-07-07 ENCOUNTER — Encounter (HOSPITAL_COMMUNITY)
Admission: AD | Disposition: A | Discharge status: Home / Self Care | Payer: Self-pay | Source: Home / Self Care | Attending: Surgery

## 2022-07-07 ENCOUNTER — Ambulatory Visit (HOSPITAL_COMMUNITY): Payer: Medicare HMO | Admitting: Certified Registered"

## 2022-07-07 ENCOUNTER — Encounter (HOSPITAL_COMMUNITY): Payer: Self-pay | Admitting: Surgery

## 2022-07-07 ENCOUNTER — Inpatient Hospital Stay (HOSPITAL_COMMUNITY)
Admission: AD | Admit: 2022-07-07 | Discharge: 2022-07-11 | DRG: 354 | Disposition: A | Discharge status: Home / Self Care | Payer: Medicare HMO | Attending: Surgery | Admitting: Surgery

## 2022-07-07 DIAGNOSIS — E11649 Type 2 diabetes mellitus with hypoglycemia without coma: Secondary | ICD-10-CM

## 2022-07-07 DIAGNOSIS — K460 Unspecified abdominal hernia with obstruction, without gangrene: Secondary | ICD-10-CM | POA: Diagnosis present

## 2022-07-07 DIAGNOSIS — Z888 Allergy status to other drugs, medicaments and biological substances status: Secondary | ICD-10-CM | POA: Diagnosis not present

## 2022-07-07 DIAGNOSIS — I959 Hypotension, unspecified: Secondary | ICD-10-CM | POA: Diagnosis not present

## 2022-07-07 DIAGNOSIS — I10 Essential (primary) hypertension: Secondary | ICD-10-CM | POA: Diagnosis present

## 2022-07-07 DIAGNOSIS — E119 Type 2 diabetes mellitus without complications: Secondary | ICD-10-CM | POA: Diagnosis present

## 2022-07-07 DIAGNOSIS — Z833 Family history of diabetes mellitus: Secondary | ICD-10-CM

## 2022-07-07 DIAGNOSIS — K660 Peritoneal adhesions (postprocedural) (postinfection): Secondary | ICD-10-CM | POA: Diagnosis present

## 2022-07-07 DIAGNOSIS — D62 Acute posthemorrhagic anemia: Secondary | ICD-10-CM | POA: Diagnosis not present

## 2022-07-07 DIAGNOSIS — K430 Incisional hernia with obstruction, without gangrene: Secondary | ICD-10-CM

## 2022-07-07 DIAGNOSIS — E7800 Pure hypercholesterolemia, unspecified: Secondary | ICD-10-CM | POA: Diagnosis present

## 2022-07-07 DIAGNOSIS — Z79890 Hormone replacement therapy (postmenopausal): Secondary | ICD-10-CM

## 2022-07-07 DIAGNOSIS — E890 Postprocedural hypothyroidism: Secondary | ICD-10-CM | POA: Diagnosis present

## 2022-07-07 DIAGNOSIS — Z8249 Family history of ischemic heart disease and other diseases of the circulatory system: Secondary | ICD-10-CM | POA: Diagnosis not present

## 2022-07-07 DIAGNOSIS — F17210 Nicotine dependence, cigarettes, uncomplicated: Secondary | ICD-10-CM | POA: Diagnosis present

## 2022-07-07 DIAGNOSIS — Z7982 Long term (current) use of aspirin: Secondary | ICD-10-CM | POA: Diagnosis not present

## 2022-07-07 DIAGNOSIS — K219 Gastro-esophageal reflux disease without esophagitis: Secondary | ICD-10-CM | POA: Diagnosis present

## 2022-07-07 DIAGNOSIS — Z90710 Acquired absence of both cervix and uterus: Secondary | ICD-10-CM

## 2022-07-07 HISTORY — PX: INCISIONAL HERNIA REPAIR: SHX193

## 2022-07-07 LAB — GLUCOSE, CAPILLARY
Glucose-Capillary: 85 mg/dL (ref 70–99)
Glucose-Capillary: 120 mg/dL — ABNORMAL HIGH (ref 70–99)
Glucose-Capillary: 133 mg/dL — ABNORMAL HIGH (ref 70–99)

## 2022-07-07 LAB — CBC
HCT: 41.4 % (ref 36.0–46.0)
Hemoglobin: 13.2 g/dL (ref 12.0–15.0)
MCH: 31.3 pg (ref 26.0–34.0)
MCHC: 31.9 g/dL (ref 30.0–36.0)
MCV: 98.1 fL (ref 80.0–100.0)
Platelets: 159 10*3/uL (ref 150–400)
RBC: 4.22 MIL/uL (ref 3.87–5.11)
RDW: 12.6 % (ref 11.5–15.5)
WBC: 13.3 10*3/uL — ABNORMAL HIGH (ref 4.0–10.5)
nRBC: 0 % (ref 0.0–0.2)

## 2022-07-07 LAB — CREATININE, SERUM
Creatinine, Ser: 0.88 mg/dL (ref 0.44–1.00)
GFR, Estimated: >60 mL/min (ref >60)

## 2022-07-07 SURGERY — REPAIR, HERNIA, INCISIONAL
Anesthesia: General

## 2022-07-07 MED ORDER — ENOXAPARIN SODIUM 40 MG/0.4ML IJ SOSY
40.0000 mg | PREFILLED_SYRINGE | INTRAMUSCULAR | Status: DC
  Administered 2022-07-08: 40 mg via SUBCUTANEOUS
  Filled 2022-07-07: qty 0.4

## 2022-07-07 MED ORDER — LIDOCAINE HCL (CARDIAC) PF 100 MG/5ML IV SOSY
PREFILLED_SYRINGE | INTRAVENOUS | Status: DC | PRN
  Administered 2022-07-07: 100 mg via INTRAVENOUS

## 2022-07-07 MED ORDER — PANTOPRAZOLE SODIUM 40 MG PO TBEC
40.0000 mg | DELAYED_RELEASE_TABLET | Freq: Every day | ORAL | Status: DC
  Administered 2022-07-08 – 2022-07-11 (×4): 40 mg via ORAL
  Filled 2022-07-07 (×4): qty 1

## 2022-07-07 MED ORDER — ROSUVASTATIN CALCIUM 20 MG PO TABS
20.0000 mg | ORAL_TABLET | Freq: Every day | ORAL | Status: DC
  Administered 2022-07-08 – 2022-07-11 (×4): 20 mg via ORAL
  Filled 2022-07-07 (×4): qty 1

## 2022-07-07 MED ORDER — ORAL CARE MOUTH RINSE
15.0000 mL | Freq: Once | OROMUCOSAL | Status: AC
Start: 2022-07-07 — End: 2022-07-07
  Administered 2022-07-07: 15 mL via OROMUCOSAL

## 2022-07-07 MED ORDER — GABAPENTIN 300 MG PO CAPS
300.0000 mg | ORAL_CAPSULE | ORAL | Status: AC
  Administered 2022-07-07: 300 mg via ORAL
  Filled 2022-07-07: qty 1

## 2022-07-07 MED ORDER — PHENYLEPHRINE HCL-NACL 20-0.9 MG/250ML-% IV SOLN
INTRAVENOUS | Status: AC
  Filled 2022-07-07: qty 250

## 2022-07-07 MED ORDER — SIMETHICONE 80 MG PO CHEW
40.0000 mg | CHEWABLE_TABLET | Freq: Four times a day (QID) | ORAL | Status: DC | PRN
  Administered 2022-07-08: 40 mg via ORAL
  Filled 2022-07-07: qty 1

## 2022-07-07 MED ORDER — FENTANYL CITRATE (PF) 100 MCG/2ML IJ SOLN
INTRAMUSCULAR | Status: AC
  Filled 2022-07-07: qty 2

## 2022-07-07 MED ORDER — DROPERIDOL 2.5 MG/ML IJ SOLN: INTRAMUSCULAR | Status: DC | PRN

## 2022-07-07 MED ORDER — PHENYLEPHRINE 80 MCG/ML (10ML) SYRINGE FOR IV PUSH (FOR BLOOD PRESSURE SUPPORT)
PREFILLED_SYRINGE | INTRAVENOUS | Status: AC
  Filled 2022-07-07: qty 10

## 2022-07-07 MED ORDER — ONDANSETRON HCL 4 MG/2ML IJ SOLN
INTRAMUSCULAR | Status: DC | PRN
  Administered 2022-07-07: 4 mg via INTRAVENOUS

## 2022-07-07 MED ORDER — SUGAMMADEX SODIUM 200 MG/2ML IV SOLN
INTRAVENOUS | Status: DC | PRN
  Administered 2022-07-07: 200 mg via INTRAVENOUS

## 2022-07-07 MED ORDER — METHOCARBAMOL 1000 MG/10ML IJ SOLN: 500.0000 mg | Freq: Four times a day (QID) | INTRAVENOUS | Status: DC | PRN

## 2022-07-07 MED ORDER — SUCCINYLCHOLINE CHLORIDE 200 MG/10ML IV SOSY
PREFILLED_SYRINGE | INTRAVENOUS | Status: DC | PRN
  Administered 2022-07-07: 120 mg via INTRAVENOUS

## 2022-07-07 MED ORDER — OXYCODONE HCL 5 MG PO TABS: 10.0000 mg | ORAL_TABLET | ORAL | Status: DC | PRN

## 2022-07-07 MED ORDER — OXYCODONE HCL 5 MG/5ML PO SOLN: 5.0000 mg | Freq: Once | ORAL | Status: DC | PRN

## 2022-07-07 MED ORDER — LIDOCAINE HCL (PF) 2 % IJ SOLN
INTRAMUSCULAR | Status: AC
  Filled 2022-07-07: qty 5

## 2022-07-07 MED ORDER — BUPIVACAINE LIPOSOME 1.3 % IJ SUSP
INTRAMUSCULAR | Status: AC
  Filled 2022-07-07: qty 20

## 2022-07-07 MED ORDER — CELECOXIB 200 MG PO CAPS
400.0000 mg | ORAL_CAPSULE | ORAL | Status: AC
  Administered 2022-07-07: 400 mg via ORAL
  Filled 2022-07-07: qty 2

## 2022-07-07 MED ORDER — 0.9 % SODIUM CHLORIDE (POUR BTL) OPTIME
TOPICAL | Status: DC | PRN
  Administered 2022-07-07: 1000 mL

## 2022-07-07 MED ORDER — DEXAMETHASONE SODIUM PHOSPHATE 10 MG/ML IJ SOLN
INTRAMUSCULAR | Status: AC
  Filled 2022-07-07: qty 1

## 2022-07-07 MED ORDER — ONDANSETRON HCL 4 MG/2ML IJ SOLN
INTRAMUSCULAR | Status: AC
  Filled 2022-07-07: qty 2

## 2022-07-07 MED ORDER — DOCUSATE SODIUM 100 MG PO CAPS
100.0000 mg | ORAL_CAPSULE | Freq: Two times a day (BID) | ORAL | Status: DC
  Administered 2022-07-07 – 2022-07-11 (×8): 100 mg via ORAL
  Filled 2022-07-07 (×8): qty 1

## 2022-07-07 MED ORDER — KETOROLAC TROMETHAMINE 15 MG/ML IJ SOLN
15.0000 mg | Freq: Three times a day (TID) | INTRAMUSCULAR | Status: DC
  Administered 2022-07-07 – 2022-07-09 (×6): 15 mg via INTRAVENOUS
  Filled 2022-07-07 (×7): qty 1

## 2022-07-07 MED ORDER — METOPROLOL SUCCINATE ER 25 MG PO TB24
25.0000 mg | ORAL_TABLET | Freq: Every day | ORAL | Status: DC
  Filled 2022-07-07: qty 1

## 2022-07-07 MED ORDER — BUPIVACAINE LIPOSOME 1.3 % IJ SUSP: 20.0000 mL | Freq: Once | INTRAMUSCULAR | Status: DC

## 2022-07-07 MED ORDER — KETOROLAC TROMETHAMINE 30 MG/ML IJ SOLN: 30.0000 mg | Freq: Once | INTRAMUSCULAR | Status: DC | PRN

## 2022-07-07 MED ORDER — CHLORHEXIDINE GLUCONATE CLOTH 2 % EX PADS: 6.0000 | MEDICATED_PAD | Freq: Once | CUTANEOUS | Status: DC

## 2022-07-07 MED ORDER — FENTANYL CITRATE PF 50 MCG/ML IJ SOSY: 25.0000 ug | PREFILLED_SYRINGE | INTRAMUSCULAR | Status: DC | PRN

## 2022-07-07 MED ORDER — ENOXAPARIN SODIUM 40 MG/0.4ML IJ SOSY
40.0000 mg | PREFILLED_SYRINGE | Freq: Once | INTRAMUSCULAR | Status: AC
  Administered 2022-07-07: 40 mg via SUBCUTANEOUS
  Filled 2022-07-07: qty 0.4

## 2022-07-07 MED ORDER — OXYCODONE HCL 5 MG PO TABS: 5.0000 mg | ORAL_TABLET | ORAL | Status: DC | PRN

## 2022-07-07 MED ORDER — OXYCODONE HCL 5 MG PO TABS: 5.0000 mg | ORAL_TABLET | Freq: Once | ORAL | Status: DC | PRN

## 2022-07-07 MED ORDER — ASPIRIN 81 MG PO TBEC
81.0000 mg | DELAYED_RELEASE_TABLET | Freq: Every day | ORAL | Status: DC
  Administered 2022-07-08 – 2022-07-11 (×4): 81 mg via ORAL
  Filled 2022-07-07 (×4): qty 1

## 2022-07-07 MED ORDER — SUCCINYLCHOLINE CHLORIDE 200 MG/10ML IV SOSY
PREFILLED_SYRINGE | INTRAVENOUS | Status: AC
  Filled 2022-07-07: qty 10

## 2022-07-07 MED ORDER — LOSARTAN POTASSIUM 50 MG PO TABS
50.0000 mg | ORAL_TABLET | Freq: Every day | ORAL | Status: DC
  Administered 2022-07-08 – 2022-07-11 (×3): 50 mg via ORAL
  Filled 2022-07-07 (×3): qty 1

## 2022-07-07 MED ORDER — DEXAMETHASONE SODIUM PHOSPHATE 10 MG/ML IJ SOLN
INTRAMUSCULAR | Status: DC | PRN
  Administered 2022-07-07: 10 mg via INTRAVENOUS

## 2022-07-07 MED ORDER — PROPOFOL 10 MG/ML IV BOLUS
INTRAVENOUS | Status: AC
  Filled 2022-07-07: qty 20

## 2022-07-07 MED ORDER — GABAPENTIN 300 MG PO CAPS
300.0000 mg | ORAL_CAPSULE | Freq: Three times a day (TID) | ORAL | Status: DC
  Administered 2022-07-07 – 2022-07-11 (×11): 300 mg via ORAL
  Filled 2022-07-07 (×11): qty 1

## 2022-07-07 MED ORDER — BUPIVACAINE LIPOSOME 1.3 % IJ SUSP
INTRAMUSCULAR | Status: DC | PRN
  Administered 2022-07-07: 10 mL

## 2022-07-07 MED ORDER — ACETAMINOPHEN 325 MG PO TABS
650.0000 mg | ORAL_TABLET | Freq: Four times a day (QID) | ORAL | Status: DC
  Administered 2022-07-08 – 2022-07-09 (×4): 650 mg via ORAL
  Filled 2022-07-07 (×7): qty 2

## 2022-07-07 MED ORDER — BUPIVACAINE-EPINEPHRINE 0.5% -1:200000 IJ SOLN
INTRAMUSCULAR | Status: DC | PRN
  Administered 2022-07-07: 10 mL

## 2022-07-07 MED ORDER — CALCIUM CARBONATE ANTACID 500 MG PO CHEW: 2000.0000 mg | CHEWABLE_TABLET | Freq: Every evening | ORAL | Status: DC | PRN

## 2022-07-07 MED ORDER — ONDANSETRON HCL 4 MG/2ML IJ SOLN
4.0000 mg | Freq: Once | INTRAMUSCULAR | Status: DC | PRN
Start: 2022-07-07 — End: 2022-07-07

## 2022-07-07 MED ORDER — ONDANSETRON HCL 4 MG/2ML IJ SOLN
4.0000 mg | Freq: Four times a day (QID) | INTRAMUSCULAR | Status: DC | PRN
  Filled 2022-07-07: qty 2

## 2022-07-07 MED ORDER — ROCURONIUM BROMIDE 100 MG/10ML IV SOLN
INTRAVENOUS | Status: DC | PRN
  Administered 2022-07-07: 20 mg via INTRAVENOUS
  Administered 2022-07-07: 30 mg via INTRAVENOUS
  Administered 2022-07-07 (×2): 20 mg via INTRAVENOUS

## 2022-07-07 MED ORDER — FENTANYL CITRATE (PF) 100 MCG/2ML IJ SOLN
INTRAMUSCULAR | Status: DC | PRN
  Administered 2022-07-07 (×2): 25 ug via INTRAVENOUS
  Administered 2022-07-07 (×3): 50 ug via INTRAVENOUS

## 2022-07-07 MED ORDER — PROCHLORPERAZINE EDISYLATE 10 MG/2ML IJ SOLN: 10.0000 mg | INTRAMUSCULAR | Status: DC | PRN

## 2022-07-07 MED ORDER — MIDAZOLAM HCL 2 MG/2ML IJ SOLN
INTRAMUSCULAR | Status: AC
  Filled 2022-07-07: qty 2

## 2022-07-07 MED ORDER — HYDROMORPHONE HCL 1 MG/ML IJ SOLN: 0.5000 mg | INTRAMUSCULAR | Status: DC | PRN

## 2022-07-07 MED ORDER — BUPIVACAINE-EPINEPHRINE 0.5% -1:200000 IJ SOLN
INTRAMUSCULAR | Status: AC
  Filled 2022-07-07: qty 1

## 2022-07-07 MED ORDER — PROPOFOL 10 MG/ML IV BOLUS
INTRAVENOUS | Status: DC | PRN
  Administered 2022-07-07: 130 mg via INTRAVENOUS

## 2022-07-07 MED ORDER — LACTATED RINGERS IV SOLN: INTRAVENOUS | Status: DC

## 2022-07-07 MED ORDER — LEVOTHYROXINE SODIUM 100 MCG PO TABS
100.0000 ug | ORAL_TABLET | Freq: Every day | ORAL | Status: DC
  Administered 2022-07-08 – 2022-07-11 (×4): 100 ug via ORAL
  Filled 2022-07-07 (×4): qty 1

## 2022-07-07 MED ORDER — CEFAZOLIN SODIUM-DEXTROSE 2-4 GM/100ML-% IV SOLN
2.0000 g | INTRAVENOUS | Status: AC
  Administered 2022-07-07: 2 g via INTRAVENOUS
  Filled 2022-07-07: qty 100

## 2022-07-07 MED ORDER — MIDAZOLAM HCL 5 MG/5ML IJ SOLN
INTRAMUSCULAR | Status: DC | PRN
  Administered 2022-07-07: 2 mg via INTRAVENOUS

## 2022-07-07 MED ORDER — PHENYLEPHRINE HCL (PRESSORS) 10 MG/ML IV SOLN
INTRAVENOUS | Status: DC | PRN
  Administered 2022-07-07: 80 ug via INTRAVENOUS
  Administered 2022-07-07: 160 ug via INTRAVENOUS

## 2022-07-07 MED ORDER — ACETAMINOPHEN 500 MG PO TABS
1000.0000 mg | ORAL_TABLET | ORAL | Status: AC
  Administered 2022-07-07: 1000 mg via ORAL
  Filled 2022-07-07: qty 2

## 2022-07-07 MED ORDER — ROCURONIUM BROMIDE 10 MG/ML (PF) SYRINGE
PREFILLED_SYRINGE | INTRAVENOUS | Status: AC
  Filled 2022-07-07: qty 10

## 2022-07-07 MED ORDER — DROPERIDOL 2.5 MG/ML IJ SOLN
INTRAMUSCULAR | Status: DC | PRN
  Administered 2022-07-07: .625 mg via INTRAVENOUS

## 2022-07-07 MED ORDER — CHLORHEXIDINE GLUCONATE 0.12 % MT SOLN: 15.0000 mL | Freq: Once | OROMUCOSAL | Status: AC

## 2022-07-07 SURGICAL SUPPLY — 38 items
BAG COUNTER SPONGE SURGICOUNT (BAG) ×1 IMPLANT
BINDER ABDOMINAL 12 ML 46-62 (SOFTGOODS) IMPLANT
CHLORAPREP W/TINT 26 (MISCELLANEOUS) ×1 IMPLANT
COVER SURGICAL LIGHT HANDLE (MISCELLANEOUS) ×1 IMPLANT
DERMABOND ADVANCED (GAUZE/BANDAGES/DRESSINGS) ×1
DERMABOND ADVANCED .7 DNX12 (GAUZE/BANDAGES/DRESSINGS) ×1 IMPLANT
DRAIN CHANNEL 19F RND (DRAIN) IMPLANT
DRAPE LAPAROSCOPIC ABDOMINAL (DRAPES) ×1 IMPLANT
DRSG TEGADERM 4X4.75 (GAUZE/BANDAGES/DRESSINGS) IMPLANT
ELECT BLADE TIP CTD 4 INCH (ELECTRODE) ×1 IMPLANT
ELECT PENCIL ROCKER SW 15FT (MISCELLANEOUS) ×1 IMPLANT
ELECT REM PT RETURN 15FT ADLT (MISCELLANEOUS) ×1 IMPLANT
EVACUATOR SILICONE 100CC (DRAIN) IMPLANT
GAUZE 4X4 16PLY ~~LOC~~+RFID DBL (SPONGE) IMPLANT
GAUZE SPONGE 4X4 12PLY STRL (GAUZE/BANDAGES/DRESSINGS) ×1 IMPLANT
GLOVE BIO SURGEON STRL SZ7.5 (GLOVE) ×1 IMPLANT
GLOVE BIOGEL PI IND STRL 8 (GLOVE) ×1 IMPLANT
GLOVE BIOGEL PI INDICATOR 8 (GLOVE) ×1
GOWN STRL REUS W/ TWL XL LVL3 (GOWN DISPOSABLE) ×3 IMPLANT
GOWN STRL REUS W/TWL XL LVL3 (GOWN DISPOSABLE) ×3
KIT BASIN OR (CUSTOM PROCEDURE TRAY) ×1 IMPLANT
KIT TURNOVER KIT A (KITS) IMPLANT
MARKER SKIN DUAL TIP RULER LAB (MISCELLANEOUS) ×1 IMPLANT
MESH SOFT 12X12IN BARD (Mesh General) IMPLANT
PACK GENERAL/GYN (CUSTOM PROCEDURE TRAY) ×1 IMPLANT
SPONGE DRAIN TRACH 4X4 STRL 2S (GAUZE/BANDAGES/DRESSINGS) IMPLANT
SPONGE T-LAP 18X18 ~~LOC~~+RFID (SPONGE) IMPLANT
SUT ETHILON 2 0 PS N (SUTURE) IMPLANT
SUT MNCRL AB 4-0 PS2 18 (SUTURE) ×1 IMPLANT
SUT PDS AB 2-0 CT2 27 (SUTURE) ×4 IMPLANT
SUT VIC AB 2-0 CT1 27 (SUTURE) ×2
SUT VIC AB 2-0 CT1 TAPERPNT 27 (SUTURE) IMPLANT
SUT VIC AB 2-0 SH 18 (SUTURE) IMPLANT
SUT VIC AB 3-0 SH 8-18 (SUTURE) ×1 IMPLANT
TOWEL OR 17X26 10 PK STRL BLUE (TOWEL DISPOSABLE) ×1 IMPLANT
TOWEL OR NON WOVEN STRL DISP B (DISPOSABLE) ×1 IMPLANT
TRAY FOLEY MTR SLVR 16FR STAT (SET/KITS/TRAYS/PACK) IMPLANT
TRAY FOLEY W/BAG SLVR 14FR LF (SET/KITS/TRAYS/PACK) IMPLANT

## 2022-07-07 NOTE — Anesthesia Procedure Notes (Addendum)
Procedure Name: Intubation Date/Time: 07/07/2022 12:55 PM  Performed by: Yarlin Breisch, Forest Gleason, CRNAPre-anesthesia Checklist: Patient identified, Emergency Drugs available, Suction available, Patient being monitored and Timeout performed Patient Re-evaluated:Patient Re-evaluated prior to induction Oxygen Delivery Method: Circle system utilized Preoxygenation: Pre-oxygenation with 100% oxygen Induction Type: IV induction, Rapid sequence and Cricoid Pressure applied Ventilation: Mask ventilation without difficulty Laryngoscope Size: Mac and 4 Grade View: Grade III Tube type: Oral Tube size: 7.0 mm Number of attempts: 1 Airway Equipment and Method: Stylet Placement Confirmation: ETT inserted through vocal cords under direct vision, positive ETCO2, CO2 detector and breath sounds checked- equal and bilateral Secured at: 22 cm Tube secured with: Tape Dental Injury: Teeth and Oropharynx as per pre-operative assessment

## 2022-07-07 NOTE — Transfer of Care (Signed)
Immediate Anesthesia Transfer of Care Note  Patient: Kristina Gates  Procedure(s) Performed: OPEN INCARCERATED INCISIONAL HERNIA REPAIR WITH MESH  Patient Location: PACU  Anesthesia Type:General  Level of Consciousness: awake, alert  and oriented  Airway & Oxygen Therapy: Patient Spontanous Breathing and Patient connected to face mask oxygen  Post-op Assessment: Report given to RN and Post -op Vital signs reviewed and stable  Post vital signs: Reviewed and stable  Last Vitals:  Vitals Value Taken Time  BP    Temp    Pulse    Resp 16 07/07/22 1636  SpO2    Vitals shown include unvalidated device data.  Last Pain:  Vitals:   07/07/22 1121  TempSrc:   PainSc: 0-No pain         Complications: No notable events documented.

## 2022-07-07 NOTE — Anesthesia Preprocedure Evaluation (Signed)
Anesthesia Evaluation  Patient identified by MRN, date of birth, ID band Patient awake    Reviewed: Allergy & Precautions, NPO status , Patient's Chart, lab work & pertinent test results  Airway Mallampati: II  TM Distance: >3 FB Neck ROM: Full    Dental no notable dental hx.    Pulmonary Current Smoker and Patient abstained from smoking.,    Pulmonary exam normal breath sounds clear to auscultation       Cardiovascular hypertension, Pt. on medications and Pt. on home beta blockers Normal cardiovascular exam Rhythm:Regular Rate:Normal     Neuro/Psych negative neurological ROS  negative psych ROS   GI/Hepatic Neg liver ROS, GERD  ,  Endo/Other  Hypothyroidism   Renal/GU negative Renal ROS  negative genitourinary   Musculoskeletal negative musculoskeletal ROS (+)   Abdominal   Peds negative pediatric ROS (+)  Hematology negative hematology ROS (+)   Anesthesia Other Findings   Reproductive/Obstetrics negative OB ROS                             Anesthesia Physical Anesthesia Plan  ASA: 2  Anesthesia Plan: General   Post-op Pain Management: Minimal or no pain anticipated   Induction: Intravenous  PONV Risk Score and Plan: 2 and Ondansetron and Dexamethasone  Airway Management Planned: Oral ETT  Additional Equipment:   Intra-op Plan:   Post-operative Plan: Extubation in OR  Informed Consent: I have reviewed the patients History and Physical, chart, labs and discussed the procedure including the risks, benefits and alternatives for the proposed anesthesia with the patient or authorized representative who has indicated his/her understanding and acceptance.     Dental advisory given  Plan Discussed with: CRNA and Surgeon  Anesthesia Plan Comments:         Anesthesia Quick Evaluation

## 2022-07-07 NOTE — Anesthesia Postprocedure Evaluation (Signed)
Anesthesia Post Note  Patient: Gregery Na  Procedure(s) Performed: OPEN INCARCERATED INCISIONAL HERNIA REPAIR WITH MESH, bilateral posterior rectus myofacial release, right transverse release     Patient location during evaluation: PACU Anesthesia Type: General Level of consciousness: awake and alert Pain management: pain level controlled Vital Signs Assessment: post-procedure vital signs reviewed and stable Respiratory status: spontaneous breathing, nonlabored ventilation, respiratory function stable and patient connected to nasal cannula oxygen Cardiovascular status: blood pressure returned to baseline and stable Postop Assessment: no apparent nausea or vomiting Anesthetic complications: no   No notable events documented.  Last Vitals:  Vitals:   07/07/22 1645 07/07/22 1700  BP: 118/68 130/68  Pulse: (!) 56 (!) 59  Resp: 16 17  Temp:    SpO2: 100% 99%    Last Pain:  Vitals:   07/07/22 1700  TempSrc:   PainSc: 3                  Davionne Mastrangelo S

## 2022-07-07 NOTE — H&P (Addendum)
Admitting Physician: Hyman Hopes Nahom Carfagno  Service: General surgery  CC: hernia  Subjective   HPI: Kristina Gates is an 66 y.o. female who is here for hernia repair  Past Medical History:  Diagnosis Date   Arthritis    Diabetes mellitus with renal complications (HCC) 2022   AKI in context of rhabdo   GERD (gastroesophageal reflux disease)    Hypercholesteremia    Hypertension    Postprocedural hypothyroidism     Past Surgical History:  Procedure Laterality Date   ABDOMINAL HYSTERECTOMY     COLON SURGERY     WISDOM TOOTH EXTRACTION      Family History  Problem Relation Age of Onset   Diabetes Mother    Hypertension Mother    Cancer Father    Stroke Father     Social:  reports that she has been smoking cigarettes. She has been smoking an average of 1 pack per day. She has never used smokeless tobacco. She reports that she does not currently use alcohol. She reports that she does not use drugs.  Allergies:  Allergies  Allergen Reactions   Metformin And Related Cough   Zestril [Lisinopril] Cough    Medications: Current Outpatient Medications  Medication Instructions   aspirin EC 81 mg, Oral, Daily, Swallow whole.   levothyroxine (SYNTHROID) 100 mcg, Oral, Daily before breakfast   losartan (COZAAR) 50 mg, Oral, Daily   metoprolol succinate (TOPROL-XL) 25 mg, Oral, Daily   Multiple Vitamin (MULTIVITAMIN WITH MINERALS) TABS tablet 1 tablet, Oral, Daily   pantoprazole (PROTONIX) 40 mg, Oral, Daily   rosuvastatin (CRESTOR) 20 mg, Oral, Daily   Tums Ultra 1000 2,000 mg, Oral, At bedtime PRN    ROS - all of the below systems have been reviewed with the patient and positives are indicated with bold text General: chills, fever or night sweats Eyes: blurry vision or double vision ENT: epistaxis or sore throat Allergy/Immunology: itchy/watery eyes or nasal congestion Hematologic/Lymphatic: bleeding problems, blood clots or swollen lymph nodes Endocrine:  temperature intolerance or unexpected weight changes Breast: new or changing breast lumps or nipple discharge Resp: cough, shortness of breath, or wheezing CV: chest pain or dyspnea on exertion GI: as per HPI GU: dysuria, trouble voiding, or hematuria MSK: joint pain or joint stiffness Neuro: TIA or stroke symptoms Derm: pruritus and skin lesion changes Psych: anxiety and depression  Objective   PE There were no vitals taken for this visit. Constitutional: NAD; conversant; no deformities Eyes: Moist conjunctiva; no lid lag; anicteric; PERRL Neck: Trachea midline; no thyromegaly Lungs: Normal respiratory effort; no tactile fremitus CV: RRR; no palpable thrills; no pitting edema GI: Abd incarcerated incisional hernia, unable to be fully reduced on exam; no palpable hepatosplenomegaly MSK: Normal range of motion of extremities; no clubbing/cyanosis Psychiatric: Appropriate affect; alert and oriented x3 Lymphatic: No palpable cervical or axillary lymphadenopathy  No results found for this or any previous visit (from the past 24 hour(s)).  Imaging Orders  No imaging studies ordered today   CT Abd/Pel 06/10/22 1. Multiple dilated loops of small bowel in the abdomen suspicious for small bowel obstruction. A transition point is seen in the periumbilical region with a hernia containing obstructed small bowel with fat stranding and a small amount of free fluid suggesting strangulated hernia. Surgical consultation is recommended for further evaluation. 2. Additional hernias are noted in the low anterior abdominal wall containing decompressed small bowel and fluid and superior to the umbilicus containing nonobstructed colon. 3. Small pericardial effusion  and coronary artery calcifications. 4. Aortic atherosclerosis.  Approximately 7cm tall x 3cm wide hernia defect on CT    Assessment and Plan   Kristina Gates has an incarcerated 7 cm tall by 3 cm wide incisional hernia. This is unable  to be fully reduced on exam. I recommended urgent repair. The I recommended open surgery to reduce the risk of injury to this incarcerated segment of intestine, and be able to do a more thorough lysis of adhesions due to her recent bowel obstructions. I explained she is at increased risk due to her diabetes, smoking, and recent weight loss, possibly some component of malnutrition. Unfortunately, this incarcerated hernia seems to be threatening to strangulate and I feel surgery is needed urgently despite these risks. She voiced understanding and agreement with this logic. I explained the procedure of open incisional hernia repair with retromuscular mesh placement. We discussed the procedure itself as well as its risk, benefits, and alternatives. After full discussion all questions answered the patient granted consent to proceed.  Quentin Ore, MD  [facility] Use [email or web address] to contact on call provider

## 2022-07-07 NOTE — Op Note (Signed)
Patient: Kristina Gates (1956/09/18, [id number])  Date of Surgery: 07/07/2022   Preoperative Diagnosis: INCARCERATED INCISIONAL HERNIA   Postoperative Diagnosis: INCARCERATED INCISIONAL HERNIA   Surgical Procedure: OPEN INCARCERATED INCISIONAL HERNIA REPAIR WITH MESH:    Operative Team Members:  Surgeon(s) and Role:    * Vikkie Goeden, Hyman Hopes, MD - Primary   Anesthesiologist: Eilene Ghazi, MD CRNA: Orest Dikes, CRNA; Younger, Merrily Pew, CRNA   Anesthesia: General   Fluids:  Total I/O In: 1000 [I.V.:1000] Out: 575 [Urine:375; Blood:200]  Complications: None  Drains:  19 Fr. Blake drain in the retromuscular position  Specimen: None  Disposition:  PACU - hemodynamically stable.  Plan of Care: Admit for overnight observation  Indications for Procedure:  Ms. Epperly has an incarcerated 7 cm tall by 3 cm wide incisional hernia. This is unable to be fully reduced on exam. I recommended urgent repair. The I recommended open surgery to reduce the risk of injury to this incarcerated segment of intestine, and be able to do a more thorough lysis of adhesions due to her recent bowel obstructions. I explained she is at increased risk due to her diabetes, smoking, and recent weight loss, possibly some component of malnutrition. Unfortunately, this incarcerated hernia seems to be threatening to strangulate and I feel surgery is needed urgently despite these risks. She voiced understanding and agreement with this logic. I explained the procedure of open incisional hernia repair with retromuscular mesh placement. We discussed the procedure itself as well as its risk, benefits, and alternatives. After full discussion all questions answered the patient granted consent to proceed.  Findings:  Hernia Location: Ventral hernia location: Epigastric (M2), Umbilical (M3), and Infraumbilical (M4) Hernia Size:  7cm tall x 3 cm wide  Mesh Size &Type:  23 cm wide x 20 cm tall Bard Soft Mesh Mesh  Position: Sublay - Retromuscular Myofascial Releases: Bilateral Myofascial Release: Posterior rectus myofascial release and right sided transversus abdominus myofascial release   Description of Procedure:  The patient was positioned supine on the operating room table, adequately padded and secured.  A timeout procedure was performed.  A midline incision was made and dissection was carried down through subcutaneous tissue. The abdomen was entered safely.  There were dense adhesions between the viscera and the abdominal wall.  A meticulous and tedious sharp lysis of adhesions was carried out. A portion of the omentum was resected as it was incarcerated in the hernia with necrotic fat and bleeding.  This resection was close to the mid portion of the transverse colon.  This was completed with no injury to the viscera.  Due to her recent issues with small bowel obstructions, the small bowel was run from the ligament of Treitz to the terminal ileum.  There were 2 areas of concerning adhesive bands down toward the pelvis with a possible area for internal hernia.  These bands were divided and the bowel was fully mobilized and free of adhesions.  After the anterior abdominal wall was cleared off of all adhesions, a large safety towel was placed over the viscera to protect them.  The hernia defect was located in the epigastric, umbilical, and infraumbilical regions.  The total hernia defect area was measured and the size was recorded above under findings.  A rectus myofascial release was performed on the LEFT side. The posterior rectus sheath was incised just lateral to the hernia edge.  This incision in the posterior rectus sheath was extended both cephalad and caudal to the hernia defect.  Dissection was carried out laterally in the retromuscular plane to the edge of the rectus sheath progressively disconnecting the rectus muscle from the underlying posterior rectus sheath. The segmental innervation comprised of  intercostal nerve branches 8, 9, 10, 11 and 12 were individually identified and preserved.  The arterial blood supply to the rectus muscle comprised of the superior and inferior epigastric arteries along with the small terminal branches from the posterior intercostal arteries 10, 11 and 12, the subcostal artery, the posterior lumbar arteries, and the deep circumflex artery were all identified and individually preserved.  The rectus myofascial release accomplished medialization of the posterior rectus sheath towards the midline and disinsertion of the rectus muscle from its surrounding fascia, and thus its encasement in the rectus sheath, allowing for widening of the rectus muscle and transfer of the rectus flap towards the midline.  This will allow for future inset of the medial aspect of the flap for abdominal wall reconstruction.  A rectus myofascial release was performed on the RIGHT side. The posterior rectus sheath was incised just lateral to the hernia edge.  This incision in the posterior rectus sheath was extended both cephalad and caudal to the hernia defect.  Dissection was carried out laterally in the retromuscular plane to the edge of the rectus sheath progressively disconnecting the rectus muscle from the underlying posterior rectus sheath. The segmental innervation comprised of intercostal nerve branches 8, 9, 10, 11 and 12 were individually identified and preserved.  The arterial blood supply to the rectus muscle comprised of the superior and inferior epigastric arteries along with the small terminal branches from the posterior intercostal arteries 10, 11 and 12, the subcostal artery, the posterior lumbar arteries, and the deep circumflex artery were all identified and individually preserved.  The rectus myofascial release accomplished medialization of the posterior rectus sheath towards the midline and disinsertion of the rectus muscle from its surrounding fascia, and thus its encasement in the  rectus sheath, allowing for widening of the rectus muscle and transfer of the rectus flap towards the midline.  This will allow for future inset of the medial aspect of the flap for abdominal wall reconstruction.  As the patient had a previous ostomy site in her right abdomen, I decided to cover this incision with the mesh as well, so a transverse abdominis release was performed.  The transversus abdominis release (TAR) was performed on the RIGHT side.   The transversus abdominis muscle was identified deep to the posterior rectus sheath and incised vertically along its entire length, entering the pre-peritoneal or pre-transversalis fascia plane.  This disinserted the transversus abdominis muscle from the linea semilunaris.  Since the intercostal nerves, arteries and veins had been preserved during the rectus myofascial release portion of the procedure, they remained intact during the TAR. The peritoneum was subsequently peeled away from the underside of the divided transversus abdominis muscle.  This dissection was carried out laterally towards the retroperitoneum.  The TAR accomplished additional medialization of the posterior rectus sheath with its attached peritoneum towards the midline to allow for visceral sac closure.  The TAR also provided further offset of tension of the rectus muscle flap with additional transfer of the rectus muscle towards the midline, as it remained attached to the external and internal abdominal oblique muscles.  This will allow for future inset of the medial aspect of the flap for abdominal wall reconstruction.   The towel was removed and the posterior fascia was reapproximated in the midline with a continuous 2-0  Vicryl suture. The retromuscular plane was copiously irrigated with warm normal saline. There was good hemostasis of the operative field.   A transversus abdominis plane (TAP) block was performed bilaterally with Exparel.  The anesthetic was injected into the plane  between the transversus abdominis and internal abdominal oblique muscle bilaterally.  A new set of sterile gloves were used prior to handling the mesh. The mesh listed above was brought into the operative field and cut to the size specified above.  The mesh was deployed into the retromuscular position where it conformed well to the space.  It did not require any fixation.  The mesh space was irrigated with saline.  One 46 [place] Blake drain was placed in the retromuscular position. The rectus muscles were re approximated in the midline utilizing a continuous 2-0 PDS suture taking 5 mm bites of the fascia with 5 mm advancement. The fascia came together well. The subcutaneous tissue was irrigated with saline. Scarpa's fascia was reapproximated with interrupted 2-0 Vicryl suture.  The subcuticular tissue was reapproximated with buried, interrupted 2-0 Vicryl suture.  The skin was closed with a 4-0 Monocryl subcuticular suture and skin glue.  Sterile dressing was applied.  All sponge and needle counts were correct at the end of this case.   Ivar Drape, MD General, Bariatric, & Minimally Invasive Surgery [facility], [place]

## 2022-07-08 ENCOUNTER — Encounter (HOSPITAL_COMMUNITY): Payer: Self-pay | Admitting: Surgery

## 2022-07-08 LAB — BASIC METABOLIC PANEL
Anion gap: 7 (ref 5–15)
BUN: 23 mg/dL (ref 8–23)
CO2: 22 mmol/L (ref 22–32)
Calcium: 8.9 mg/dL (ref 8.9–10.3)
Chloride: 109 mmol/L (ref 98–111)
Creatinine, Ser: 0.84 mg/dL (ref 0.44–1.00)
GFR, Estimated: >60 mL/min (ref >60)
Glucose, Bld: 94 mg/dL (ref 70–99)
Potassium: 4 mmol/L (ref 3.5–5.1)
Sodium: 138 mmol/L (ref 135–145)

## 2022-07-08 LAB — CBC
HCT: 35.7 % — ABNORMAL LOW (ref 36.0–46.0)
Hemoglobin: 11.8 g/dL — ABNORMAL LOW (ref 12.0–15.0)
MCH: 31.6 pg (ref 26.0–34.0)
MCHC: 33.1 g/dL (ref 30.0–36.0)
MCV: 95.7 fL (ref 80.0–100.0)
Platelets: 162 10*3/uL (ref 150–400)
RBC: 3.73 MIL/uL — ABNORMAL LOW (ref 3.87–5.11)
RDW: 12.7 % (ref 11.5–15.5)
WBC: 11.9 10*3/uL — ABNORMAL HIGH (ref 4.0–10.5)
nRBC: 0 % (ref 0.0–0.2)

## 2022-07-08 MED ORDER — SODIUM CHLORIDE 0.9 % IV SOLN
Freq: Once | INTRAVENOUS | Status: AC
  Administered 2022-07-08: 500 mL/h via INTRAVENOUS

## 2022-07-08 NOTE — Progress Notes (Signed)
Progress Note: General Surgery Service   Chief Complaint/Subjective: Doing well POD1.  Pain only when moving around.  Likes the abdominal binder  Objective: Vital signs in last 24 hours: Temp:  [97.6 F (36.4 C)-98.4 F (36.9 C)] 98.4 F (36.9 C) (08/18 0951) Pulse Rate:  [55-75] 75 (08/18 0951) Resp:  [13-21] 16 (08/18 0509) BP: (92-130)/(51-82) 107/51 (08/18 0951) SpO2:  [93 %-100 %] 99 % (08/18 0951) Last BM Date : 07/06/22  Intake/Output from previous day: 08/17 0701 - 08/18 0700 In: 2740 [P.O.:240; I.V.:2500] Out: 1150 [Urine:675; Drains:275; Blood:200] Intake/Output this shift: Total I/O In: 240 [P.O.:240] Out: -    GI: Abd Soft, tender around incision, incision c/d/I w/ glue, JP with serosanguinous fluid  Lab Results: CBC  Recent Labs    07/07/22 1841 07/08/22 0440  WBC 13.3* 11.9*  HGB 13.2 11.8*  HCT 41.4 35.7*  PLT 159 162   BMET Recent Labs    07/07/22 1841 07/08/22 0440  NA  --  138  K  --  4.0  CL  --  109  CO2  --  22  GLUCOSE  --  94  BUN  --  23  CREATININE 0.88 0.84  CALCIUM  --  8.9   PT/INR No results for input(s): "LABPROT", "INR" in the last 72 hours. ABG No results for input(s): "PHART", "HCO3" in the last 72 hours.  Invalid input(s): "PCO2", "PO2"  Anti-infectives: Anti-infectives (From admission, onward)    Start     Dose/Rate Route Frequency Ordered Stop   07/07/22 1115  ceFAZolin (ANCEF) IVPB 2g/100 mL premix        2 g 200 mL/hr over 30 Minutes Intravenous On call to O.R. 07/07/22 1100 07/07/22 1309       Medications: Scheduled Meds:  acetaminophen  650 mg Oral Q6H   aspirin EC  81 mg Oral Daily   docusate sodium  100 mg Oral BID   enoxaparin (LOVENOX) injection  40 mg Subcutaneous Q24H   gabapentin  300 mg Oral TID   ketorolac  15 mg Intravenous Q8H   levothyroxine  100 mcg Oral QAC breakfast   losartan  50 mg Oral Daily   metoprolol succinate  25 mg Oral Daily   pantoprazole  40 mg Oral Daily    rosuvastatin  20 mg Oral Daily   Continuous Infusions:  methocarbamol (ROBAXIN) IV     PRN Meds:.calcium carbonate, HYDROmorphone (DILAUDID) injection, methocarbamol (ROBAXIN) IV, ondansetron (ZOFRAN) IV, oxyCODONE, oxyCODONE, prochlorperazine, simethicone  Assessment/Plan: s/p Procedure(s): OPEN INCARCERATED INCISIONAL HERNIA REPAIR WITH MESH, bilateral posterior rectus myofacial release, right transverse release 07/07/2022  Doing well POD1 Monitor JP - remove JP on day of discharge Work with PT/OT with extensive abdominal wall dissection to build up strength prior to discharge Tentative plan for discharge on Monday, lives alone, felt she's been discharged too early in the past   LOS: 0 days   Quentin Ore, MD  [facility] Use [email or web address] to contact on call provider  Daily Billing: [id number] - post op

## 2022-07-08 NOTE — Progress Notes (Signed)
RM 1321, Renee Ramus, Had Incarcerated Hernia Repair, yesterday. BP 104/47, felt dizzy unsteady gait, seeing spots,= fluids?

## 2022-07-08 NOTE — Evaluation (Signed)
Occupational Therapy Evaluation Patient Details Name: Kristina Gates MRN: [id number] DOB: 1956-08-24 Today's Date: 07/08/2022   History of Present Illness Kristina Gates has an incarcerated 7 cm tall by 3 cm wide incisional hernia no s/p OPEN INCARCERATED INCISIONAL HERNIA REPAIR WITH MESH: PMHx significant for HTN, HLD, thyroid disease, and DMII.   Clinical Impression   Kristina Gates is a 66 year old woman s/p abdominal surgery. On evaluation she demonstrates ability to perform Adls and functional mobility in room. She needs increased time and modified positioning due to pain/soreness but otherwise independent. She has PRN assistance of friends at home. Patient has no OT needs at this time.     Recommendations for follow up therapy are one component of a multi-disciplinary discharge planning process, led by the attending physician.  Recommendations may be updated based on patient status, additional functional criteria and insurance authorization.   Follow Up Recommendations  No OT follow up    Assistance Recommended at Discharge PRN  Patient can return home with the following Assistance with cooking/housework    Functional Status Assessment  Patient has not had a recent decline in their functional status  Equipment Recommendations  None recommended by OT    Recommendations for Other Services       Precautions / Restrictions Precautions Precaution Comments: abdominal incision, binder Restrictions Weight Bearing Restrictions: No      Mobility Bed Mobility Overal bed mobility: Modified Independent                  Transfers Overall transfer level: Needs assistance Equipment used: None               General transfer comment: Ambulated in room holding on to IV pole.      Balance Overall balance assessment: No apparent balance deficits (not formally assessed)                                         ADL either performed or assessed with  clinical judgement   ADL Overall ADL's : Modified independent                                       General ADL Comments: Able to perform all ADLs with increased time and modified positioning but no physical assistance.     Vision Baseline Vision/History: 1 Wears glasses Patient Visual Report: No change from baseline       Perception     Praxis      Pertinent Vitals/Pain Pain Assessment Pain Assessment: Faces Faces Pain Scale: Hurts a little bit Pain Location: abdomen Pain Descriptors / Indicators: Sore     Hand Dominance Right   Extremity/Trunk Assessment Upper Extremity Assessment Upper Extremity Assessment: Overall WFL for tasks assessed   Lower Extremity Assessment Lower Extremity Assessment: Defer to PT evaluation   Cervical / Trunk Assessment Cervical / Trunk Assessment: Normal   Communication Communication Communication: No difficulties   Cognition Arousal/Alertness: Awake/alert Behavior During Therapy: WFL for tasks assessed/performed Overall Cognitive Status: Within Functional Limits for tasks assessed                                       General Comments  Exercises     Shoulder Instructions      Home Living Family/patient expects to be discharged to:: Private residence Living Arrangements: Alone Available Help at Discharge: Family;Available PRN/intermittently Type of Home: House Home Access: Stairs to enter Entrance Stairs-Number of Steps: 1   Home Layout: Two level;Able to live on main level with bedroom/bathroom   Alternate Level Stairs-Rails: Can reach both Bathroom Shower/Tub: Chief Strategy Officer: Standard Bathroom Accessibility: Yes How Accessible: Accessible via walker Home Equipment: Shower seat;Rolling Walker (2 wheels);BSC/3in1   Additional Comments: has a friend and a housekeeper to help take care of her.      Prior Functioning/Environment Prior Level of Function :  Independent/Modified Independent                        OT Problem List: Pain      OT Treatment/Interventions:      OT Goals(Current goals can be found in the care plan section) Acute Rehab OT Goals OT Goal Formulation: All assessment and education complete, DC therapy  OT Frequency:      Co-evaluation              AM-PAC OT "6 Clicks" Daily Activity     Outcome Measure Help from another person eating meals?: None Help from another person taking care of personal grooming?: None Help from another person toileting, which includes using toliet, bedpan, or urinal?: None Help from another person bathing (including washing, rinsing, drying)?: None Help from another person to put on and taking off regular upper body clothing?: None Help from another person to put on and taking off regular lower body clothing?: None 6 Click Score: 24   End of Session Nurse Communication: Mobility status  Activity Tolerance: Patient tolerated treatment well Patient left: in chair;with call bell/phone within reach;with family/visitor present  OT Visit Diagnosis: Pain                Time: 6433-2951 OT Time Calculation (min): 14 min Charges:  OT General Charges $OT Visit: 1 Visit OT Evaluation $OT Eval Low Complexity: 1 Low  Toriana Sponsel, OTR/L Acute Care Rehab Services  Office [phone] Pager: [phone]   Kelli Churn 07/08/2022, 9:34 AM

## 2022-07-08 NOTE — Evaluation (Signed)
Physical Therapy Evaluation Patient Details Name: Kristina Gates MRN: [id number] DOB: August 28, 1956 Today's Date: 07/08/2022  History of Present Illness  Ms. Cummiskey has an incarcerated 7 cm tall by 3 cm wide incisional hernia no s/p OPEN INCARCERATED INCISIONAL HERNIA REPAIR WITH MESH: PMHx significant for HTN, HLD, thyroid disease, and DMII.   Clinical Impression  Kristina Gates is 66 y.o. female admitted with above HPI and diagnosis. Patient is currently limited by functional impairments below (see PT problem list). Patient lives alone and is independent at baseline. Patient evaluated by Physical Therapy with no further acute PT needs identified. All education has been completed and the patient has no further questions. Pt currently mobilizing at Mod I to supervision level with no device. She is safe to mobilize with RN/NT/mobility team. See below for any follow-up Physical Therapy or equipment needs. PT is signing off. Thank you for this referral.        Recommendations for follow up therapy are one component of a multi-disciplinary discharge planning process, led by the attending physician.  Recommendations may be updated based on patient status, additional functional criteria and insurance authorization.  Follow Up Recommendations No PT follow up      Assistance Recommended at Discharge None  Patient can return home with the following       Equipment Recommendations None recommended by PT  Recommendations for Other Services       Functional Status Assessment Patient has had a recent decline in their functional status and demonstrates the ability to make significant improvements in function in a reasonable and predictable amount of time.     Precautions / Restrictions Precautions Precaution Comments: abdominal incision, binder Restrictions Weight Bearing Restrictions: No      Mobility  Bed Mobility               General bed mobility comments: OOB in recliner     Transfers Overall transfer level: Modified independent Equipment used: None               General transfer comment: mod I for sit<>stand from recliner, no cues needed.    Ambulation/Gait Ambulation/Gait assistance: Supervision Gait Distance (Feet): 500 Feet Assistive device: None Gait Pattern/deviations: WFL(Within Functional Limits) Gait velocity: fair     General Gait Details: staedy overall with occasional drift Rt/Lt but able to self correct balance  Stairs Stairs: Yes Stairs assistance: Supervision Stair Management: One rail Right, Forwards Number of Stairs: 8 General stair comments: no cues needed, no LOB  Wheelchair Mobility    Modified Rankin (Stroke Patients Only)       Balance Overall balance assessment: No apparent balance deficits (not formally assessed)                                           Pertinent Vitals/Pain Pain Assessment Pain Assessment: Faces Faces Pain Scale: Hurts a little bit Pain Location: abdomen Pain Descriptors / Indicators: Sore Pain Intervention(s): Limited activity within patient's tolerance, Monitored during session    Home Living Family/patient expects to be discharged to:: Private residence Living Arrangements: Alone Available Help at Discharge: Family;Available PRN/intermittently Type of Home: House Home Access: Stairs to enter Entrance Stairs-Rails: None Entrance Stairs-Number of Steps: 1   Home Layout: Two level;Able to live on main level with bedroom/bathroom;Full bath on main level Home Equipment: Shower Counsellor (2 wheels);BSC/3in1 Additional Comments: has a friend  and a housekeeper to help take care of her.    Prior Function Prior Level of Function : Independent/Modified Independent                     Hand Dominance   Dominant Hand: Right    Extremity/Trunk Assessment   Upper Extremity Assessment Upper Extremity Assessment: Overall WFL for tasks  assessed;Defer to OT evaluation    Lower Extremity Assessment Lower Extremity Assessment: Overall WFL for tasks assessed    Cervical / Trunk Assessment Cervical / Trunk Assessment: Normal  Communication   Communication: No difficulties  Cognition Arousal/Alertness: Awake/alert Behavior During Therapy: WFL for tasks assessed/performed Overall Cognitive Status: Within Functional Limits for tasks assessed                                          General Comments      Exercises     Assessment/Plan    PT Assessment Patient does not need any further PT services  PT Problem List Decreased activity tolerance;Decreased balance;Decreased mobility;Decreased knowledge of use of DME;Decreased knowledge of precautions;Decreased safety awareness       PT Treatment Interventions DME instruction;Gait training;Stair training;Functional mobility training;Therapeutic activities;Therapeutic exercise;Balance training;Patient/family education    PT Goals (Current goals can be found in the Care Plan section)  Acute Rehab PT Goals Patient Stated Goal: go home PT Goal Formulation: All assessment and education complete, DC therapy Time For Goal Achievement: 07/09/22 Potential to Achieve Goals: Good    Frequency Other (Comment) (1x eval)     Co-evaluation               AM-PAC PT "6 Clicks" Mobility  Outcome Measure Help needed turning from your back to your side while in a flat bed without using bedrails?: None Help needed moving from lying on your back to sitting on the side of a flat bed without using bedrails?: None Help needed moving to and from a bed to a chair (including a wheelchair)?: A Little Help needed standing up from a chair using your arms (e.g., wheelchair or bedside chair)?: A Little Help needed to walk in hospital room?: A Little Help needed climbing 3-5 steps with a railing? : A Little 6 Click Score: 20    End of Session Equipment Utilized During  Treatment: Gait belt Activity Tolerance: Patient tolerated treatment well Patient left: in chair;with call bell/phone within reach Nurse Communication: Mobility status PT Visit Diagnosis: Muscle weakness (generalized) (M62.81);Difficulty in walking, not elsewhere classified (R26.2);Unsteadiness on feet (R26.81)    Time: 1610-9604 PT Time Calculation (min) (ACUTE ONLY): 13 min   Charges:   PT Evaluation $PT Eval Low Complexity: 1 Low          Wynn Maudlin, DPT Acute Rehabilitation Services Office [phone] Pager [phone]  07/08/22 12:27 PM

## 2022-07-08 NOTE — Progress Notes (Signed)
Transition of Care [facility]) Screening Note  Patient Details  Name: Kristina Gates Date of Birth: 1956/03/19  Transition of Care [facility]) CM/SW Contact:    Ewing Schlein, LCSW Phone Number: 07/08/2022, 12:50 PM  Transition of Care Department [facility]) has reviewed patient and no TOC needs have been identified at this time. We will continue to monitor patient advancement through interdisciplinary progression rounds. If new patient transition needs arise, please place a TOC consult.

## 2022-07-08 NOTE — Progress Notes (Addendum)
rm 1321, Kristina Gates, I gave 500 cc of NS bolus, afterwards BP is 94/52. Pulse 82. Looks like she's had these Vitals before.  Notified MD and orders received to place CBC blood draw now.

## 2022-07-09 DIAGNOSIS — K219 Gastro-esophageal reflux disease without esophagitis: Secondary | ICD-10-CM | POA: Diagnosis present

## 2022-07-09 DIAGNOSIS — Z833 Family history of diabetes mellitus: Secondary | ICD-10-CM | POA: Diagnosis not present

## 2022-07-09 DIAGNOSIS — K660 Peritoneal adhesions (postprocedural) (postinfection): Secondary | ICD-10-CM | POA: Diagnosis present

## 2022-07-09 DIAGNOSIS — F17210 Nicotine dependence, cigarettes, uncomplicated: Secondary | ICD-10-CM | POA: Diagnosis present

## 2022-07-09 DIAGNOSIS — Z79890 Hormone replacement therapy (postmenopausal): Secondary | ICD-10-CM | POA: Diagnosis not present

## 2022-07-09 DIAGNOSIS — E890 Postprocedural hypothyroidism: Secondary | ICD-10-CM | POA: Diagnosis present

## 2022-07-09 DIAGNOSIS — D62 Acute posthemorrhagic anemia: Secondary | ICD-10-CM | POA: Diagnosis not present

## 2022-07-09 DIAGNOSIS — E7800 Pure hypercholesterolemia, unspecified: Secondary | ICD-10-CM | POA: Diagnosis present

## 2022-07-09 DIAGNOSIS — Z90710 Acquired absence of both cervix and uterus: Secondary | ICD-10-CM | POA: Diagnosis not present

## 2022-07-09 DIAGNOSIS — I959 Hypotension, unspecified: Secondary | ICD-10-CM | POA: Diagnosis not present

## 2022-07-09 DIAGNOSIS — Z8249 Family history of ischemic heart disease and other diseases of the circulatory system: Secondary | ICD-10-CM | POA: Diagnosis not present

## 2022-07-09 DIAGNOSIS — K430 Incisional hernia with obstruction, without gangrene: Secondary | ICD-10-CM | POA: Diagnosis present

## 2022-07-09 DIAGNOSIS — Z888 Allergy status to other drugs, medicaments and biological substances status: Secondary | ICD-10-CM | POA: Diagnosis not present

## 2022-07-09 DIAGNOSIS — Z7982 Long term (current) use of aspirin: Secondary | ICD-10-CM | POA: Diagnosis not present

## 2022-07-09 DIAGNOSIS — K460 Unspecified abdominal hernia with obstruction, without gangrene: Secondary | ICD-10-CM | POA: Diagnosis present

## 2022-07-09 DIAGNOSIS — I10 Essential (primary) hypertension: Secondary | ICD-10-CM | POA: Diagnosis present

## 2022-07-09 DIAGNOSIS — E119 Type 2 diabetes mellitus without complications: Secondary | ICD-10-CM | POA: Diagnosis present

## 2022-07-09 LAB — CBC
HCT: 27 % — ABNORMAL LOW (ref 36.0–46.0)
Hemoglobin: 9.1 g/dL — ABNORMAL LOW (ref 12.0–15.0)
MCH: 32 pg (ref 26.0–34.0)
MCHC: 33.7 g/dL (ref 30.0–36.0)
MCV: 95.1 fL (ref 80.0–100.0)
Platelets: 128 10*3/uL — ABNORMAL LOW (ref 150–400)
RBC: 2.84 MIL/uL — ABNORMAL LOW (ref 3.87–5.11)
RDW: 12.7 % (ref 11.5–15.5)
WBC: 6 10*3/uL (ref 4.0–10.5)
nRBC: 0 % (ref 0.0–0.2)

## 2022-07-09 LAB — TYPE AND SCREEN
ABO/RH(D): O POS
Antibody Screen: NEGATIVE

## 2022-07-09 LAB — BASIC METABOLIC PANEL
Anion gap: 4 — ABNORMAL LOW (ref 5–15)
BUN: 26 mg/dL — ABNORMAL HIGH (ref 8–23)
CO2: 25 mmol/L (ref 22–32)
Calcium: 8.3 mg/dL — ABNORMAL LOW (ref 8.9–10.3)
Chloride: 107 mmol/L (ref 98–111)
Creatinine, Ser: 0.77 mg/dL (ref 0.44–1.00)
GFR, Estimated: >60 mL/min (ref >60)
Glucose, Bld: 134 mg/dL — ABNORMAL HIGH (ref 70–99)
Potassium: 3.9 mmol/L (ref 3.5–5.1)
Sodium: 136 mmol/L (ref 135–145)

## 2022-07-09 LAB — ABO/RH: ABO/RH(D): O POS

## 2022-07-09 MED ORDER — POLYETHYLENE GLYCOL 3350 17 G PO PACK
17.0000 g | PACK | Freq: Every day | ORAL | Status: DC
Start: 2022-07-09 — End: 2022-07-11
  Administered 2022-07-09 – 2022-07-11 (×3): 17 g via ORAL
  Filled 2022-07-09 (×3): qty 1

## 2022-07-09 MED ORDER — ACETAMINOPHEN 325 MG PO TABS
650.0000 mg | ORAL_TABLET | Freq: Four times a day (QID) | ORAL | Status: DC
  Administered 2022-07-09 – 2022-07-11 (×6): 650 mg via ORAL
  Filled 2022-07-09 (×6): qty 2

## 2022-07-09 NOTE — Plan of Care (Signed)
Problem: Clinical Measurements: Goal: Ability to maintain clinical measurements within normal limits will improve Outcome: Progressing   Problem: Clinical Measurements: Goal: Cardiovascular complication will be avoided Outcome: Progressing   Problem: Activity: Goal: Risk for activity intolerance will decrease Outcome: Progressing   Haydee Salter, RN. 07/09/22 8:08 AM

## 2022-07-09 NOTE — Progress Notes (Signed)
Progress Note: General Surgery Service   Chief Complaint/Subjective: Had soft BP overnight with some dizziness, SBP in 90s. Given 500cc bolus. BP remains soft this morning, but patient says she ambulated this morning with no dizziness. Hgb downtrending to 9.1 from 11.8 yesterday. Passing flatus but no bowel movement yet.  Objective: Vital signs in last 24 hours: Temp:  [98.1 F (36.7 C)-99.6 F (37.6 C)] 98.4 F (36.9 C) (08/19 0936) Pulse Rate:  [75-91] 80 (08/19 0936) Resp:  [17-18] 18 (08/19 0936) BP: (91-107)/(47-54) 101/48 (08/19 0936) SpO2:  [95 %-100 %] 99 % (08/19 0936) Last BM Date : 07/06/22  Intake/Output from previous day: 08/18 0701 - 08/19 0700 In: 823.9 [P.O.:540; I.V.:283.9] Out: 535 [Urine:400; Drains:135] Intake/Output this shift: Total I/O In: 240 [P.O.:240] Out: 40 [Drains:40]   GI: Abd Soft, tender around incision, incision c/d/I w/ glue, JP with serosanguinous fluid.  Lab Results: CBC  Recent Labs    07/08/22 0440 07/09/22 0014  WBC 11.9* 6.0  HGB 11.8* 9.1*  HCT 35.7* 27.0*  PLT 162 128*   BMET Recent Labs    07/08/22 0440 07/09/22 0014  NA 138 136  K 4.0 3.9  CL 109 107  CO2 22 25  GLUCOSE 94 134*  BUN 23 26*  CREATININE 0.84 0.77  CALCIUM 8.9 8.3*   PT/INR No results for input(s): "LABPROT", "INR" in the last 72 hours. ABG No results for input(s): "PHART", "HCO3" in the last 72 hours.  Invalid input(s): "PCO2", "PO2"  Anti-infectives: Anti-infectives (From admission, onward)    Start     Dose/Rate Route Frequency Ordered Stop   07/07/22 1115  ceFAZolin (ANCEF) IVPB 2g/100 mL premix        2 g 200 mL/hr over 30 Minutes Intravenous On call to O.R. 07/07/22 1100 07/07/22 1309       Medications: Scheduled Meds:  acetaminophen  650 mg Oral Q6H   aspirin EC  81 mg Oral Daily   docusate sodium  100 mg Oral BID   gabapentin  300 mg Oral TID   ketorolac  15 mg Intravenous Q8H   levothyroxine  100 mcg Oral QAC breakfast    losartan  50 mg Oral Daily   pantoprazole  40 mg Oral Daily   rosuvastatin  20 mg Oral Daily   Continuous Infusions:  methocarbamol (ROBAXIN) IV     PRN Meds:.calcium carbonate, HYDROmorphone (DILAUDID) injection, methocarbamol (ROBAXIN) IV, ondansetron (ZOFRAN) IV, oxyCODONE, oxyCODONE, prochlorperazine, simethicone  Assessment/Plan: s/p Procedure(s): OPEN INCARCERATED INCISIONAL HERNIA REPAIR WITH MESH, bilateral posterior rectus myofacial release, right transverse release 07/07/2022  Regular diet Soft BP - Hold metoprolol today. Will also hold lovenox given downtrending hemoglobin, recheck tomorrow morning. Type and screen ordered in case of need for transfusion. Monitor JP - remove JP on day of discharge Bowel regimen: colace, add miralax today Work with PT/OT with extensive abdominal wall dissection to build up strength prior to discharge Tentative plan for discharge on Monday, lives alone, felt she's been discharged too early in the past  Fritzi Mandes, MD [facility] Use [email or web address] to contact on call provider

## 2022-07-10 LAB — CBC
HCT: 25.4 % — ABNORMAL LOW (ref 36.0–46.0)
Hemoglobin: 8.3 g/dL — ABNORMAL LOW (ref 12.0–15.0)
MCH: 31 pg (ref 26.0–34.0)
MCHC: 32.7 g/dL (ref 30.0–36.0)
MCV: 94.8 fL (ref 80.0–100.0)
Platelets: 121 10*3/uL — ABNORMAL LOW (ref 150–400)
RBC: 2.68 MIL/uL — ABNORMAL LOW (ref 3.87–5.11)
RDW: 12.5 % (ref 11.5–15.5)
WBC: 4.7 10*3/uL (ref 4.0–10.5)
nRBC: 0 % (ref 0.0–0.2)

## 2022-07-10 LAB — BASIC METABOLIC PANEL
Anion gap: 5 (ref 5–15)
BUN: 20 mg/dL (ref 8–23)
CO2: 24 mmol/L (ref 22–32)
Calcium: 8.2 mg/dL — ABNORMAL LOW (ref 8.9–10.3)
Chloride: 106 mmol/L (ref 98–111)
Creatinine, Ser: 0.54 mg/dL (ref 0.44–1.00)
GFR, Estimated: >60 mL/min (ref >60)
Glucose, Bld: 94 mg/dL (ref 70–99)
Potassium: 4 mmol/L (ref 3.5–5.1)
Sodium: 135 mmol/L (ref 135–145)

## 2022-07-10 NOTE — Progress Notes (Signed)
3 Days Post-Op   Subjective/Chief Complaint: No complaints but still hasn't had bm   Objective: Vital signs in last 24 hours: Temp:  [98.4 F (36.9 C)-98.6 F (37 C)] 98.6 F (37 C) (08/20 0450) Pulse Rate:  [76-80] 76 (08/20 0450) Resp:  [16-18] 16 (08/20 0450) BP: (101-119)/(48-68) 119/68 (08/20 0450) SpO2:  [99 %] 99 % (08/20 0450) Last BM Date : 07/06/22  Intake/Output from previous day: 08/19 0701 - 08/20 0700 In: 1200 [P.O.:1200] Out: 175 [Drains:175] Intake/Output this shift: No intake/output data recorded.  General appearance: alert and cooperative Resp: clear to auscultation bilaterally Cardio: regular rate and rhythm GI: soft, minimal tenderness. Good bs  Lab Results:  Recent Labs    07/09/22 0014 07/10/22 0404  WBC 6.0 4.7  HGB 9.1* 8.3*  HCT 27.0* 25.4*  PLT 128* 121*   BMET Recent Labs    07/09/22 0014 07/10/22 0404  NA 136 135  K 3.9 4.0  CL 107 106  CO2 25 24  GLUCOSE 134* 94  BUN 26* 20  CREATININE 0.77 0.54  CALCIUM 8.3* 8.2*   PT/INR No results for input(s): "LABPROT", "INR" in the last 72 hours. ABG No results for input(s): "PHART", "HCO3" in the last 72 hours.  Invalid input(s): "PCO2", "PO2"  Studies/Results: No results found.  Anti-infectives: Anti-infectives (From admission, onward)    Start     Dose/Rate Route Frequency Ordered Stop   07/07/22 1115  ceFAZolin (ANCEF) IVPB 2g/100 mL premix        2 g 200 mL/hr over 30 Minutes Intravenous On call to O.R. 07/07/22 1100 07/07/22 1309       Assessment/Plan: s/p Procedure(s) with comments: OPEN INCARCERATED INCISIONAL HERNIA REPAIR WITH MESH, bilateral posterior rectus myofacial release, right transverse release (N/A) - bilateral posterior rectus myofacial release, right transverse release Advance diet Ambulate Monitor JP - remove JP on day of discharge Bowel regimen: colace, add miralax today Work with PT/OT with extensive abdominal wall dissection to build up  strength prior to discharge Tentative plan for discharge on Monday, lives alone, felt she's been discharged too early in the past POD 3  LOS: 1 day    Chevis Pretty III 07/10/2022

## 2022-07-11 LAB — BASIC METABOLIC PANEL
Anion gap: 6 (ref 5–15)
BUN: 15 mg/dL (ref 8–23)
CO2: 25 mmol/L (ref 22–32)
Calcium: 8.3 mg/dL — ABNORMAL LOW (ref 8.9–10.3)
Chloride: 106 mmol/L (ref 98–111)
Creatinine, Ser: 0.56 mg/dL (ref 0.44–1.00)
GFR, Estimated: >60 mL/min (ref >60)
Glucose, Bld: 115 mg/dL — ABNORMAL HIGH (ref 70–99)
Potassium: 4.3 mmol/L (ref 3.5–5.1)
Sodium: 137 mmol/L (ref 135–145)

## 2022-07-11 LAB — CBC
HCT: 27.6 % — ABNORMAL LOW (ref 36.0–46.0)
Hemoglobin: 9.3 g/dL — ABNORMAL LOW (ref 12.0–15.0)
MCH: 31.6 pg (ref 26.0–34.0)
MCHC: 33.7 g/dL (ref 30.0–36.0)
MCV: 93.9 fL (ref 80.0–100.0)
Platelets: 146 10*3/uL — ABNORMAL LOW (ref 150–400)
RBC: 2.94 MIL/uL — ABNORMAL LOW (ref 3.87–5.11)
RDW: 12.3 % (ref 11.5–15.5)
WBC: 4.6 10*3/uL (ref 4.0–10.5)
nRBC: 0 % (ref 0.0–0.2)

## 2022-07-11 MED ORDER — POLYETHYLENE GLYCOL 3350 17 G PO PACK: 17.0000 g | PACK | Freq: Every day | ORAL | 0 refills | Status: AC

## 2022-07-11 MED ORDER — DOCUSATE SODIUM 100 MG PO CAPS
100.0000 mg | ORAL_CAPSULE | Freq: Every day | ORAL | 2 refills | Status: DC | PRN
Start: 2022-07-11 — End: 2023-05-02

## 2022-07-11 MED ORDER — OXYCODONE-ACETAMINOPHEN 5-325 MG PO TABS: 1.0000 | ORAL_TABLET | ORAL | 0 refills | Status: DC | PRN

## 2022-07-11 NOTE — Discharge Summary (Signed)
Patient ID: Kristina Gates [id number] 66 y.o. 06-Nov-1956  07/07/2022  Discharge date and time: 07/11/2022  Admitting Physician: Kristina Gates  Discharge Physician: Kristina Gates  Admission Diagnoses: Incarcerated hernia [K46.0] Patient Active Problem List   Diagnosis Date Noted   Incarcerated hernia 07/07/2022   Hypoglycemia 05/10/2022   SBO (small bowel obstruction) (HCC) 05/09/2022   Hypercalcemia 05/09/2022   Hypokalemia 05/09/2022   AMS (altered mental status) 05/17/2021   Unspecified protein-calorie malnutrition (HCC) 05/17/2021   Acute renal failure (ARF) (HCC) 05/10/2021   Metabolic acidosis 05/10/2021   Neutropenia (HCC) 02/10/2021   Mass of left side of neck 02/02/2021   SVT (supraventricular tachycardia) (HCC)    Traumatic rhabdomyolysis (HCC) 01/22/2021   Type 2 diabetes mellitus with hypoglycemia without coma (HCC)    Essential hypertension 01/21/2021   Hyperglycemia due to type 2 diabetes mellitus (HCC) 01/21/2021   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/2021   Pure hypercholesterolemia 01/21/2021   Sepsis (HCC) 01/21/2021     Discharge Diagnoses: hernia Patient Active Problem List   Diagnosis Date Noted   Incarcerated hernia 07/07/2022   Hypoglycemia 05/10/2022   SBO (small bowel obstruction) (HCC) 05/09/2022   Hypercalcemia 05/09/2022   Hypokalemia 05/09/2022   AMS (altered mental status) 05/17/2021   Unspecified protein-calorie malnutrition (HCC) 05/17/2021   Acute renal failure (ARF) (HCC) 05/10/2021   Metabolic acidosis 05/10/2021   Neutropenia (HCC) 02/10/2021   Mass of left side of neck 02/02/2021   SVT (supraventricular tachycardia) (HCC)    Traumatic rhabdomyolysis (HCC) 01/22/2021   Type 2 diabetes mellitus with hypoglycemia without coma (HCC)    Essential hypertension 01/21/2021   Hyperglycemia due to type 2 diabetes mellitus (HCC) 01/21/2021   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/2021   Pure hypercholesterolemia 01/21/2021    Sepsis (HCC) 01/21/2021    Operations: Procedure(s): OPEN INCARCERATED INCISIONAL HERNIA REPAIR WITH MESH, bilateral posterior rectus myofacial release, right transverse release  Admission Condition: good  Discharged Condition: good  Indication for Admission: Hernia  Hospital Course: [name] underwent hernia repair, recovered and was discharged.  She had some blood loss anemia and hypotension so blood pressure medications were held postoperatively inpatient.  Otherwise she did well.    Consults: None  Significant Diagnostic Studies: None  Treatments: surgery: As above  Disposition: Home  Patient Instructions:  Allergies as of 07/11/2022       Reactions   Metformin And Related Cough   Zestril [lisinopril] Cough        Medication List     TAKE these medications    aspirin EC 81 MG tablet Take 81 mg by mouth daily. Swallow whole.   docusate sodium 100 MG capsule Commonly known as: Colace Take 1 capsule (100 mg total) by mouth daily as needed.   levothyroxine 100 MCG tablet Commonly known as: SYNTHROID Take 1 tablet (100 mcg total) by mouth daily before breakfast.   losartan 50 MG tablet Commonly known as: COZAAR Take 50 mg by mouth daily.   metoprolol succinate 25 MG 24 hr tablet Commonly known as: TOPROL-XL Take 1 tablet by mouth once daily   multivitamin with minerals Tabs tablet Take 1 tablet by mouth daily.   oxyCODONE-acetaminophen 5-325 MG tablet Commonly known as: Percocet Take 1 tablet by mouth every 4 (four) hours as needed for severe pain.   pantoprazole 40 MG tablet Commonly known as: PROTONIX Take 40 mg by mouth daily.   polyethylene glycol 17 g packet Commonly known as: MIRALAX / GLYCOLAX Take 17 g by mouth daily.  rosuvastatin 20 MG tablet Commonly known as: CRESTOR Take 1 tablet (20 mg total) by mouth daily.   Tums Ultra 1000 400 MG chewable tablet Generic drug: calcium elemental as carbonate Chew 2,000 mg by mouth at bedtime  as needed for heartburn.        Activity: no heavy lifting for 4 weeks Diet: regular diet Wound Care: keep wound clean and dry  Follow-up:  With Dr. Dossie Der in 4 weeks.  Signed: Hyman Hopes Kristina Gates, Bariatric, & Minimally Invasive Surgery [facility], [place]   07/11/2022, 9:27 AM

## 2022-07-11 NOTE — Progress Notes (Addendum)
Removed JP drain per order, instructed pt on care of puncture site and supplies given to pt. Reviewed written d/c instructions w pt and all questions answered. She verbalized understanding. D/C ambulatory w all belongings in stable condition

## 2022-08-04 DIAGNOSIS — Z23 Encounter for immunization: Secondary | ICD-10-CM | POA: Diagnosis not present

## 2022-08-11 DIAGNOSIS — E7800 Pure hypercholesterolemia, unspecified: Secondary | ICD-10-CM | POA: Diagnosis not present

## 2022-08-11 DIAGNOSIS — E1165 Type 2 diabetes mellitus with hyperglycemia: Secondary | ICD-10-CM | POA: Diagnosis not present

## 2022-08-11 DIAGNOSIS — I10 Essential (primary) hypertension: Secondary | ICD-10-CM | POA: Diagnosis not present

## 2022-08-11 DIAGNOSIS — E890 Postprocedural hypothyroidism: Secondary | ICD-10-CM | POA: Diagnosis not present

## 2022-08-12 DIAGNOSIS — Z9889 Other specified postprocedural states: Secondary | ICD-10-CM | POA: Diagnosis not present

## 2022-08-12 DIAGNOSIS — K430 Incisional hernia with obstruction, without gangrene: Secondary | ICD-10-CM | POA: Diagnosis not present

## 2022-08-15 DIAGNOSIS — K219 Gastro-esophageal reflux disease without esophagitis: Secondary | ICD-10-CM | POA: Diagnosis not present

## 2022-08-15 DIAGNOSIS — I10 Essential (primary) hypertension: Secondary | ICD-10-CM | POA: Diagnosis not present

## 2022-08-15 DIAGNOSIS — E039 Hypothyroidism, unspecified: Secondary | ICD-10-CM | POA: Diagnosis not present

## 2022-08-15 DIAGNOSIS — E1169 Type 2 diabetes mellitus with other specified complication: Secondary | ICD-10-CM | POA: Diagnosis not present

## 2022-08-17 IMAGING — CT CT CHEST W/ CM
2 of 4 series · 14 of 36 positions shown, 17 images · IV contrast (omnipaque)
Comparison: Portable chest obtained earlier today.

CLINICAL DATA: Fell. Left rib fractures on a portable chest earlier
today. Abdominal trauma.

EXAM:
CT CHEST, ABDOMEN, AND PELVIS WITH CONTRAST
TECHNIQUE: Multidetector CT imaging of the chest, abdomen and pelvis was
performed following the standard protocol during bolus
administration of intravenous contrast.
CONTRAST:  100mL OMNIPAQUE IOHEXOL 300 MG/ML  SOLN

[Series 3: cap 5.0 i31f 2 · axial · 0.98mm/px · z∈[+888,+1438]mm · 11 of 122 slices shown, 14 images]
[im 6/122  mediastinal]
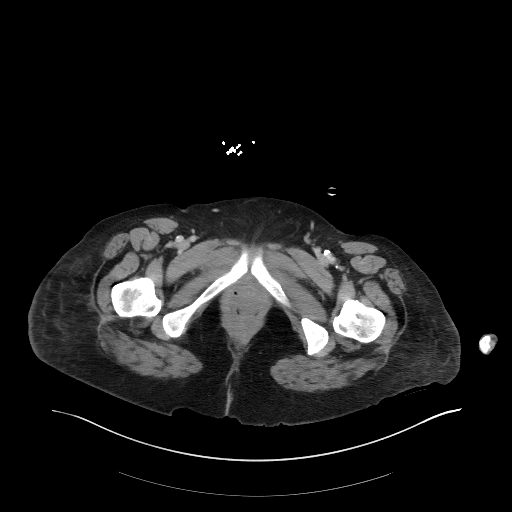
[im 6/122  lung]
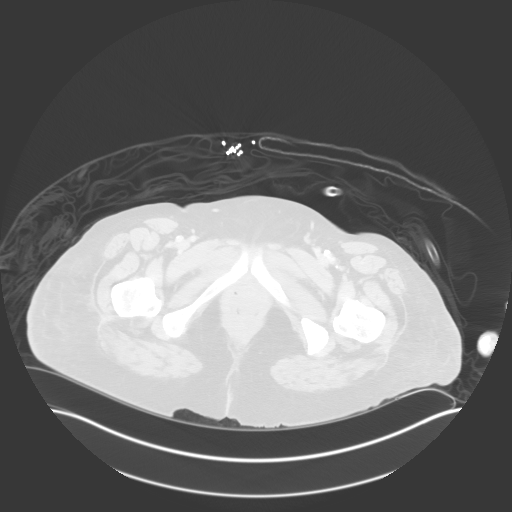
[im 18/122  lung]
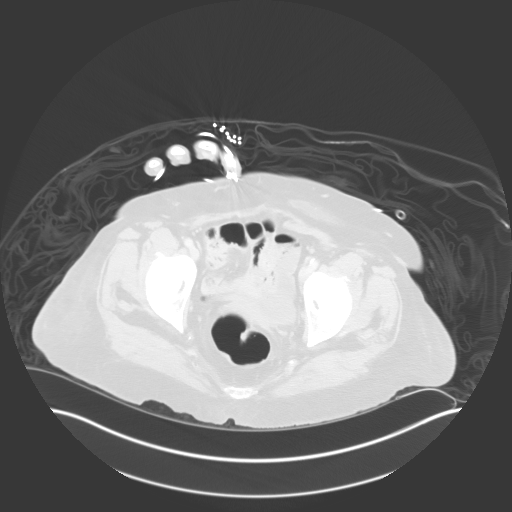
[im 29/122  lung]
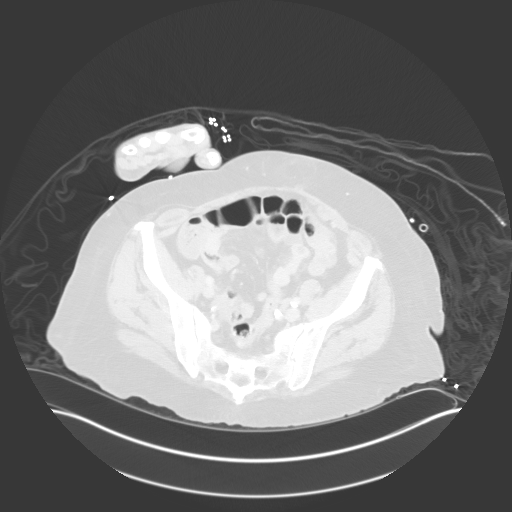
[im 41/122  lung]
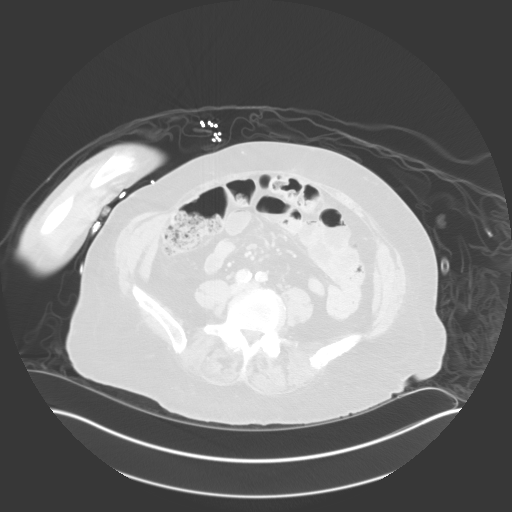
[im 52/122  mediastinal]
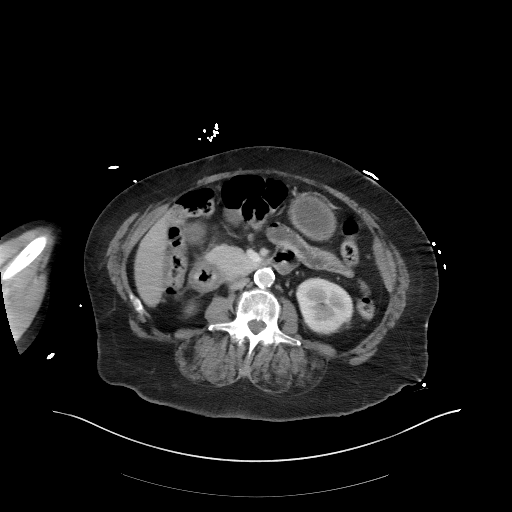
[im 52/122  lung]
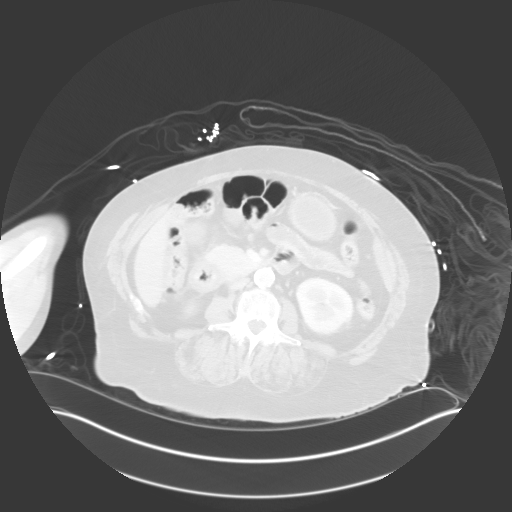
[im 64/122  lung]
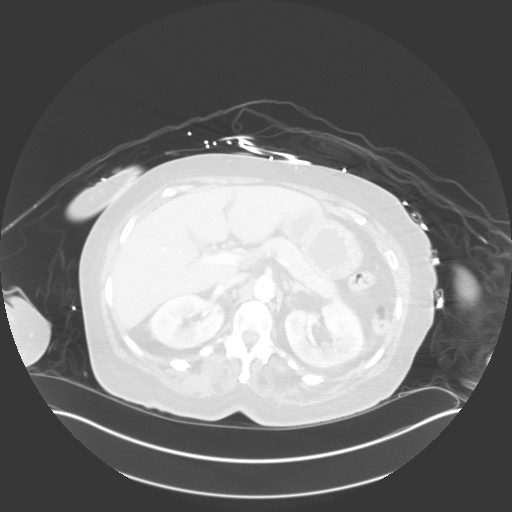
[im 70/122  lung]
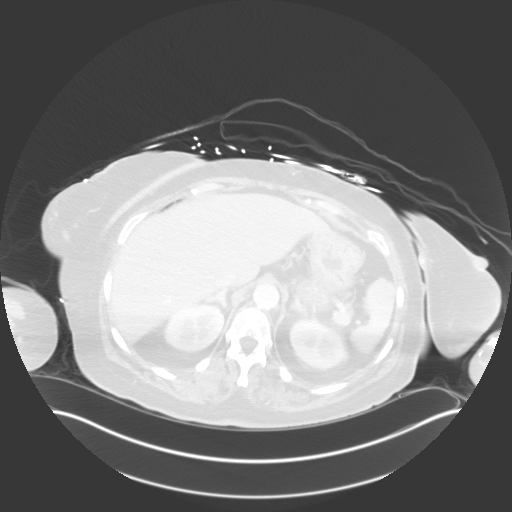
[im 81/122  lung]
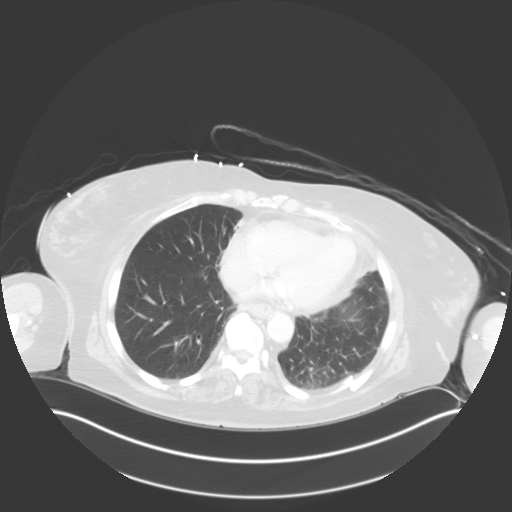
[im 93/122  mediastinal]
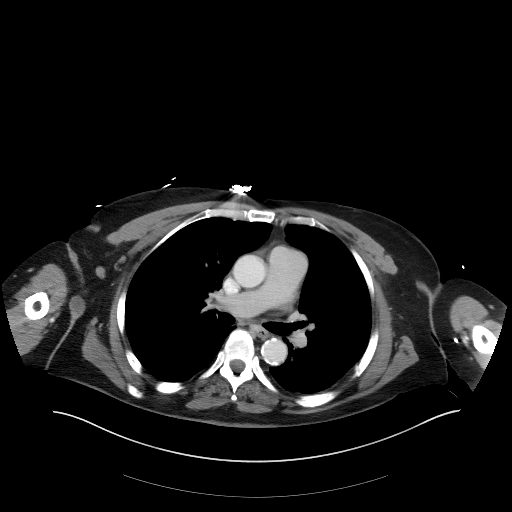
[im 93/122  lung]
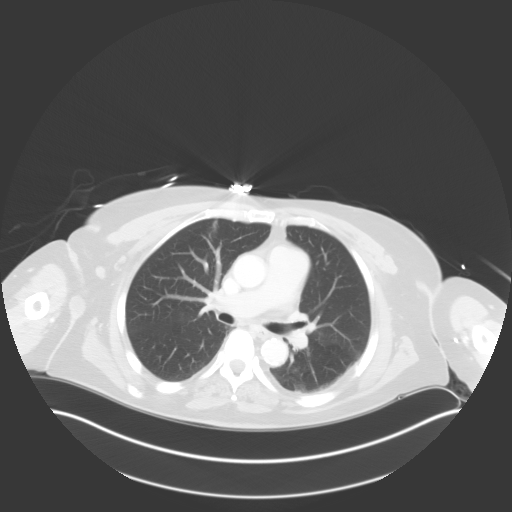
[im 104/122  lung]
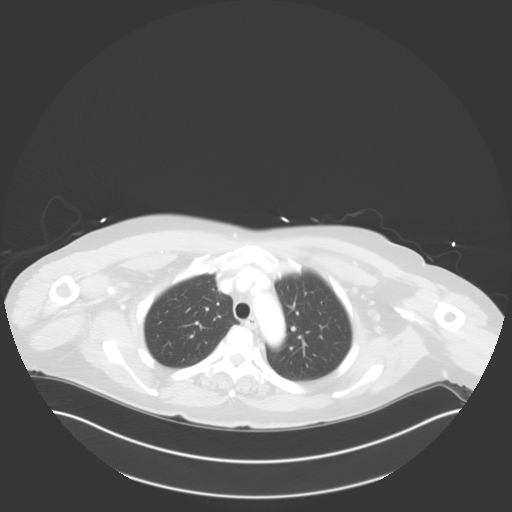
[im 116/122  lung]
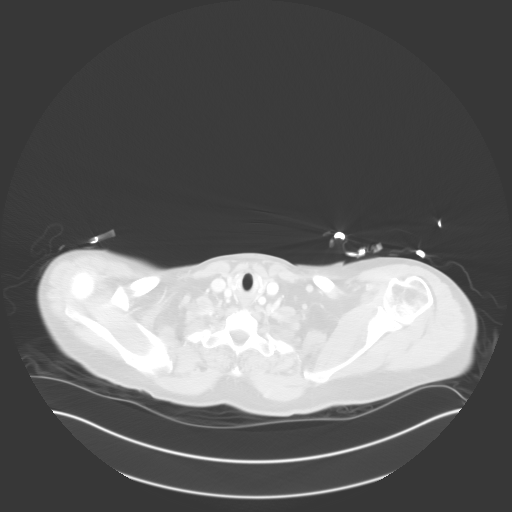

[Series 6: coronal · coronal · 0.84mm/px · 3 of 158 slices shown]
[im 32/158  lung]
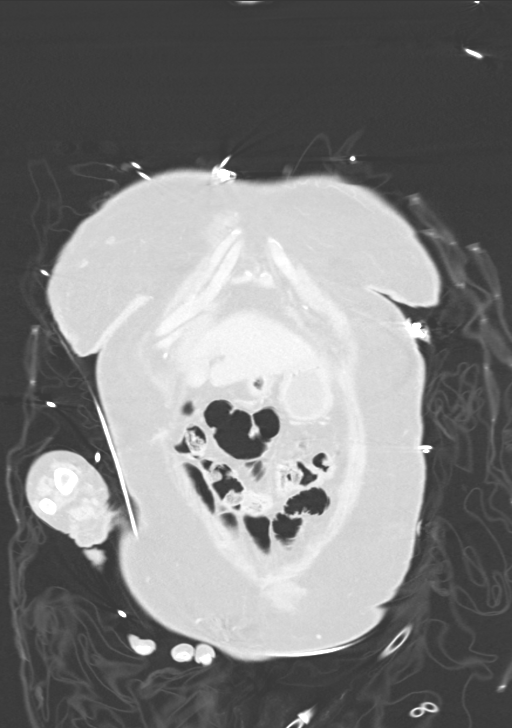
[im 63/158  lung]
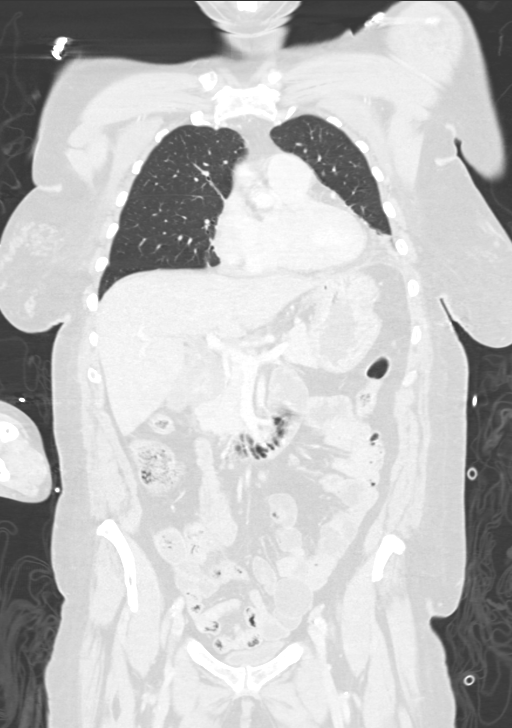
[im 95/158  lung]
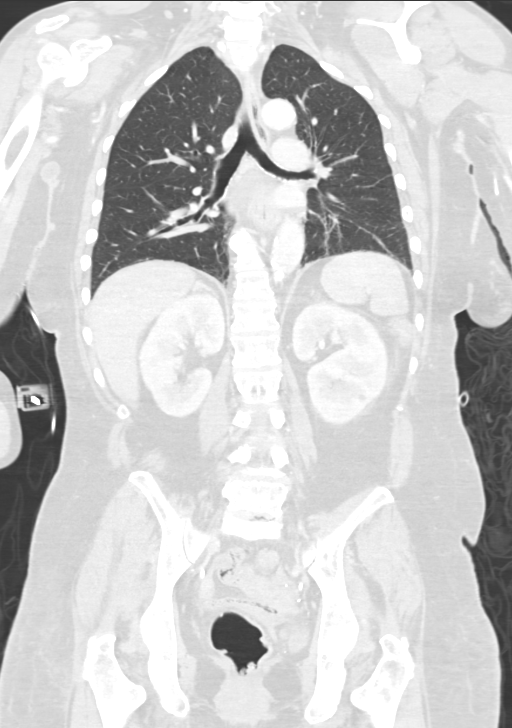

[14 of 36 positions shown; findings below may reference images not displayed]

FINDINGS: CT CHEST FINDINGS

Cardiovascular: Atheromatous calcifications, including the coronary
arteries and aorta. Normal sized heart. Minimal pericardial effusion
with a maximum thickness of 7 mm.

Mediastinum/Nodes: Very small thyroid gland or thyroid remnant. No
enlarged lymph nodes. Unremarkable esophagus.

Lungs/Pleura: Minimal bilateral dependent atelectasis. Otherwise,
clear lungs. No pneumothorax or pleural fluid.

Musculoskeletal: Left posterolateral acute 4th through 8th rib
fractures. Varying degrees of displacement. No spinal fractures or
subluxations. The sternum is intact. Thoracic and lower cervical
spine degenerative changes. Diffuse osteopenia.

CT ABDOMEN PELVIS FINDINGS

Hepatobiliary: No focal liver abnormality is seen. No gallstones,
gallbladder wall thickening, or biliary dilatation.

Pancreas: Unremarkable. No pancreatic ductal dilatation or
surrounding inflammatory changes.

Spleen: Normal in size without focal abnormality.

Adrenals/Urinary Tract: Normal appearing adrenal glands. Small
bilateral renal cysts. Unremarkable ureters and urinary bladder.

Stomach/Bowel: Moderate diffuse low density wall thickening
involving the gastric antrum, pylorus, duodenal bulb and 2nd portion
of the duodenum. Minimal adjacent soft tissue stranding. Moderate to
marked luminal narrowing.

Unremarkable colon and appendix.

Vascular/Lymphatic: Atheromatous arterial calcifications without
aneurysm. No enlarged lymph nodes.

Reproductive: Status post hysterectomy. No adnexal masses.

Other: Small umbilical hernia containing fat. Multiple small
supraumbilical and infraumbilical ventral hernias containing fat.

Musculoskeletal: Lumbar spine degenerative changes. These include
facet degenerative changes with associated grade 1 anterolisthesis
at the L4-5 level. No fractures, subluxations or dislocations.
IMPRESSION: 1. Left posterolateral acute 4th through 8th rib fractures without
pneumothorax.
2. Moderate diffuse low density wall thickening involving the
gastric antrum, pylorus, duodenal bulb and 2nd portion of the
duodenum with associated moderate to marked luminal narrowing. This
is most likely due to gastritis and duodenitis.
3. Minimal pericardial effusion.
4. Multiple small supraumbilical and infraumbilical ventral hernias
containing fat.
5. Calcific coronary artery and aortic atherosclerosis.

Aortic Atherosclerosis ([id number]).

## 2022-08-17 IMAGING — CT CT HEAD W/O CM
3 series · 14 of 47 positions shown, 16 images · non-contrast
Comparison: None.

CLINICAL DATA: Recent fall with headaches and facial pain, initial
encounter

EXAM:
CT HEAD WITHOUT CONTRAST
CT CERVICAL SPINE WITHOUT CONTRAST
TECHNIQUE: Multidetector CT imaging of the head and cervical spine was
performed following the standard protocol without intravenous
contrast. Multiplanar CT image reconstructions of the cervical spine
were also generated.

[Series 1: head 5.0 h30s · axial · 0.46mm/px · z∈[-165,-40]mm · 8 of 31 slices shown, 10 images]
[im 3/31  brain]
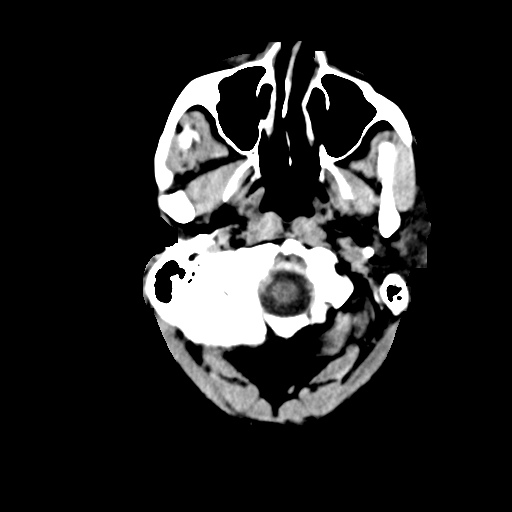
[im 3/31  bone]
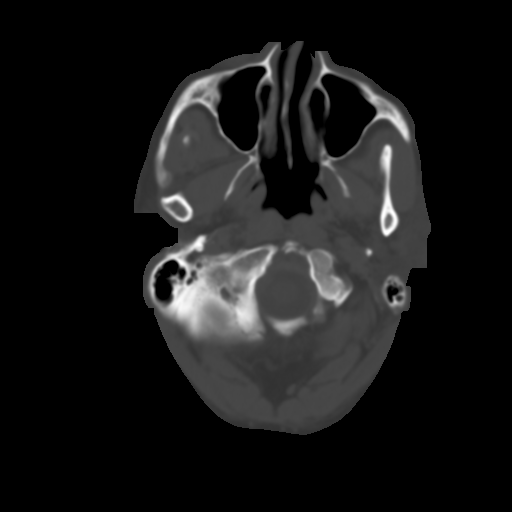
[im 7/31  brain]
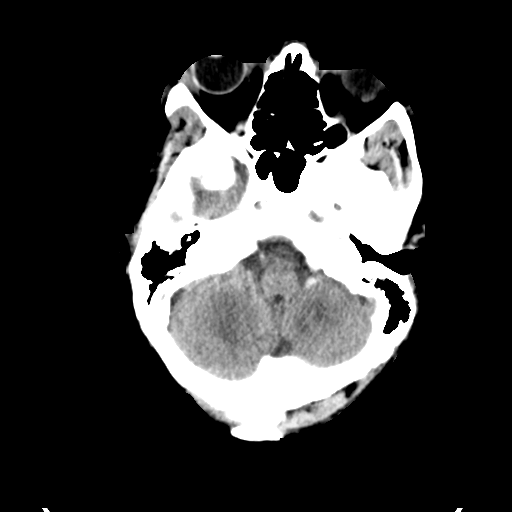
[im 10/31  brain]
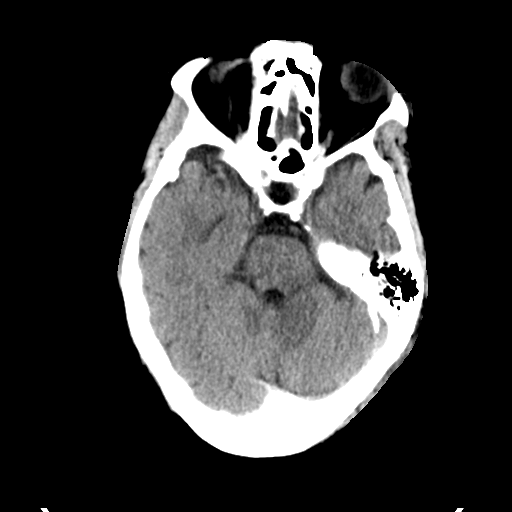
[im 14/31  brain]
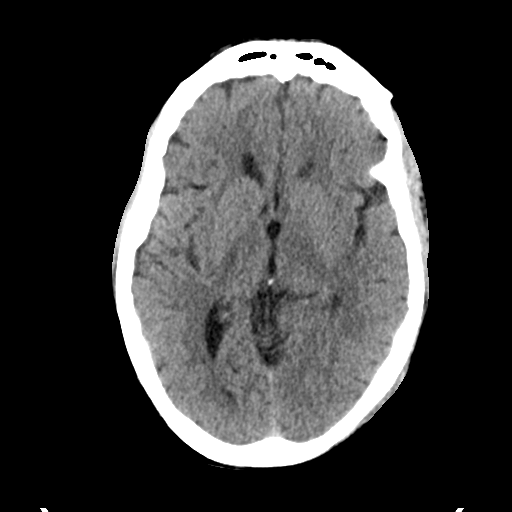
[im 17/31  brain]
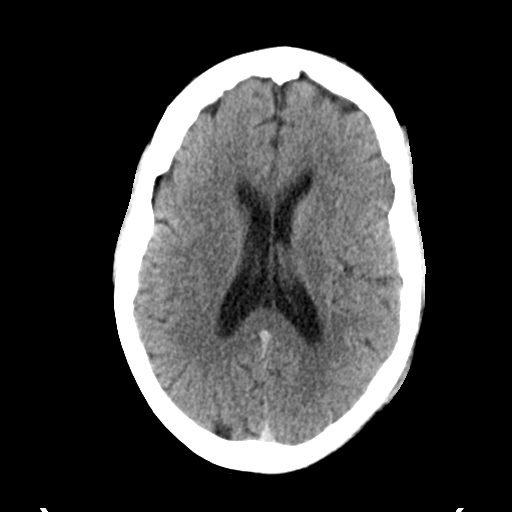
[im 17/31  bone]
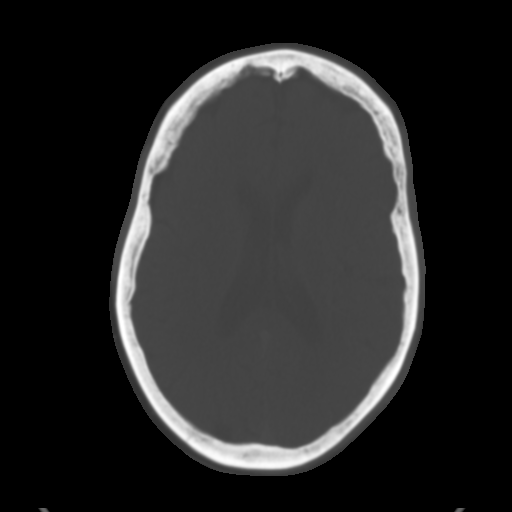
[im 21/31  brain]
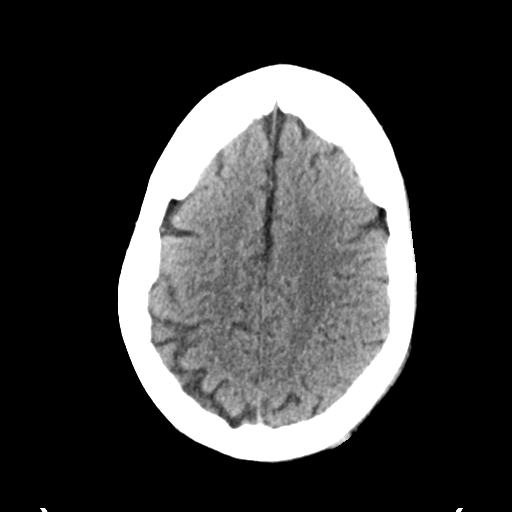
[im 24/31  brain]
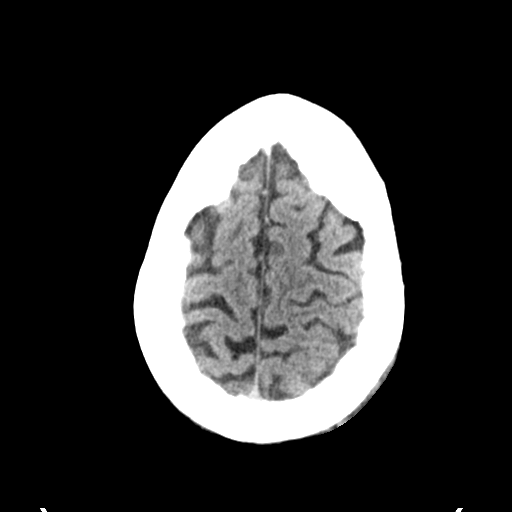
[im 28/31  brain]
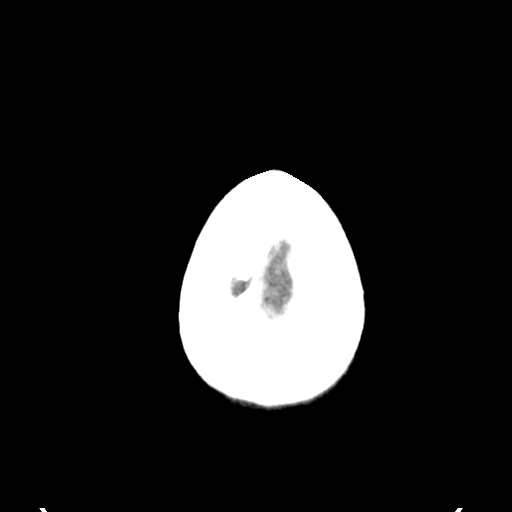

[Series 5: head 3.0 mpr cor · coronal · 0.31mm/px · 3 of 75 slices shown]
[im 25/75  brain]
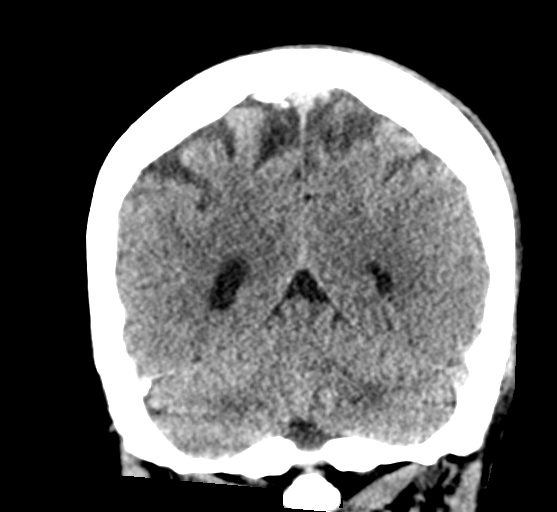
[im 33/75  brain]
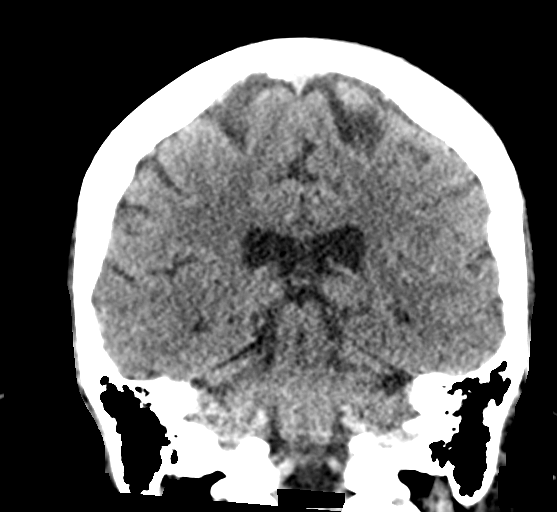
[im 42/75  brain]
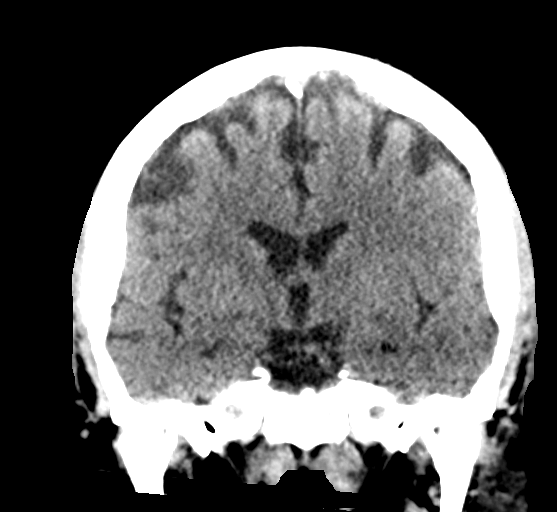

[Series 6: head 3.0 mpr sag · sagittal · 0.30mm/px · 3 of 65 slices shown]
[im 22/65  brain]
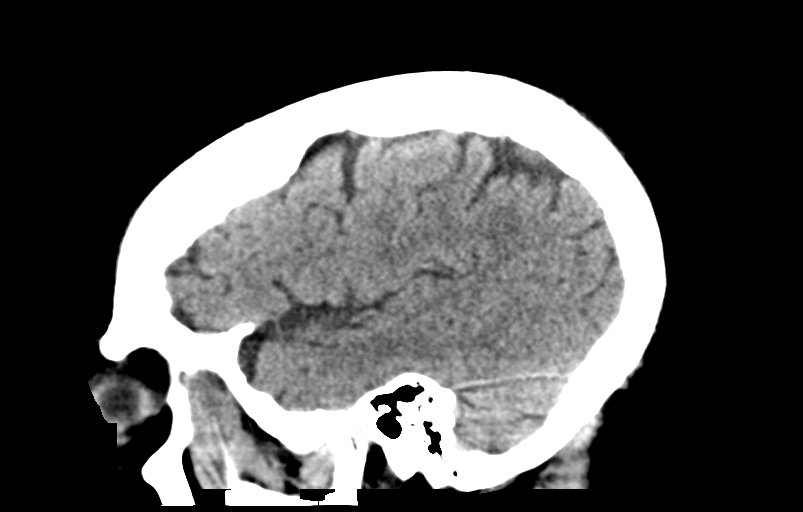
[im 33/65  brain]
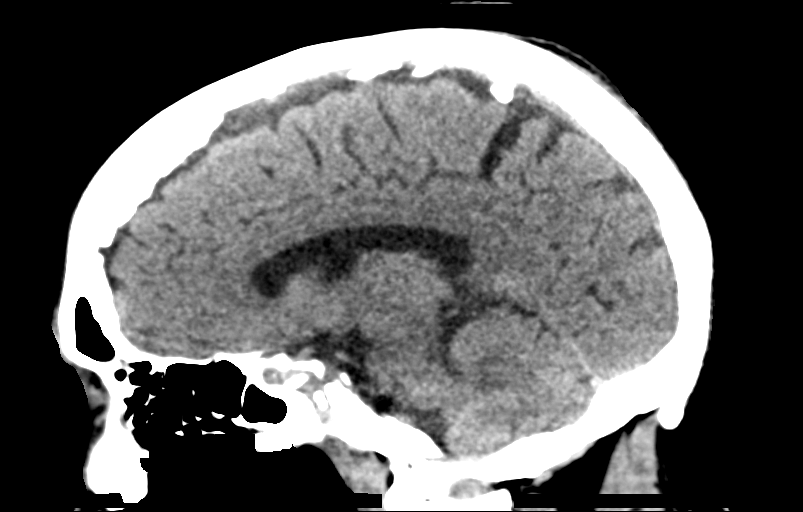
[im 43/65  brain]
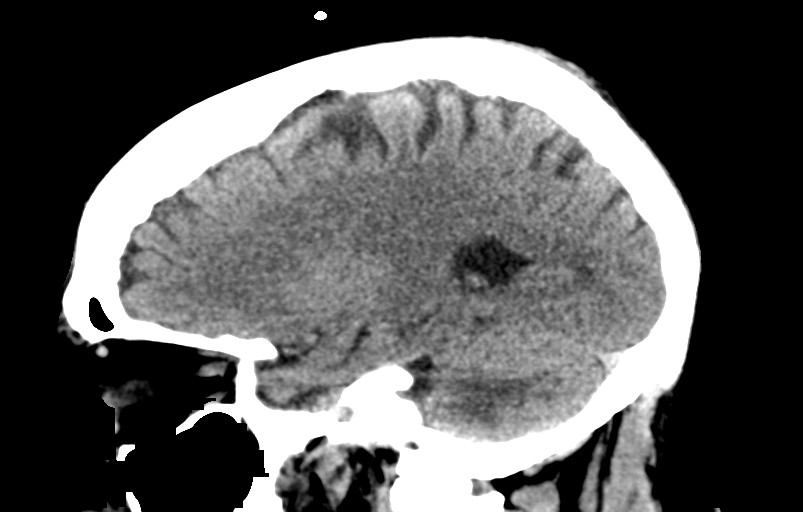

[14 of 47 positions shown; findings below may reference images not displayed]

FINDINGS: CT HEAD FINDINGS

Brain: No evidence of acute infarction, hemorrhage, hydrocephalus,
extra-axial collection or mass lesion/mass effect.

Vascular: No hyperdense vessel or unexpected calcification.

Skull: Normal. Negative for fracture or focal lesion.

Sinuses/Orbits: No acute finding.

Other: Mild scalp hematoma is noted in the left posterior parietal
region related to the recent injury.

CT CERVICAL SPINE FINDINGS

Alignment: Mild straightening of the normal cervical lordosis is
noted likely related to muscular spasm.

Skull base and vertebrae: 7 cervical segments are well visualized.
Vertebral body height is well maintained. Mild osteophytic changes
are noted primarily at C6-7 anteriorly. No acute fracture or acute
facet abnormality is noted. The odontoid is within normal limits.

Soft tissues and spinal canal: There is a 16 by 25 mm mildly
hyperdense nodule identified in the posterior aspect of the left
parotid gland. This was present on prior CT of the neck from
08/25/2020 and is somewhat smaller in size when compared with the
prior exam.

Upper chest: Visualized lung apices are within normal limits.

Other: None
IMPRESSION: CT of the head: Mild scalp hematoma in the left posterior parietal
region. No acute intracranial abnormality noted.

CT of the cervical spine: Mild degenerative change of the cervical
spine without acute bony abnormality.

Left parotid lesion somewhat smaller and less heterogeneous when
compared with the prior exam as described. ENT referral is again
recommended if this has not been performed.

## 2022-08-17 IMAGING — CT CT CERVICAL SPINE W/O CM
3 of 4 series · 11 of 33 positions shown, 13 images · non-contrast
Comparison: None.

CLINICAL DATA: Recent fall with headaches and facial pain, initial
encounter

EXAM:
CT HEAD WITHOUT CONTRAST
CT CERVICAL SPINE WITHOUT CONTRAST
TECHNIQUE: Multidetector CT imaging of the head and cervical spine was
performed following the standard protocol without intravenous
contrast. Multiplanar CT image reconstructions of the cervical spine
were also generated.

[Series 5: c_spine 2.0 st · axial · 0.33mm/px · z∈[-301,-167]mm · 3 of 101 slices shown, 4 images]
[im 17/101  soft-tissue]
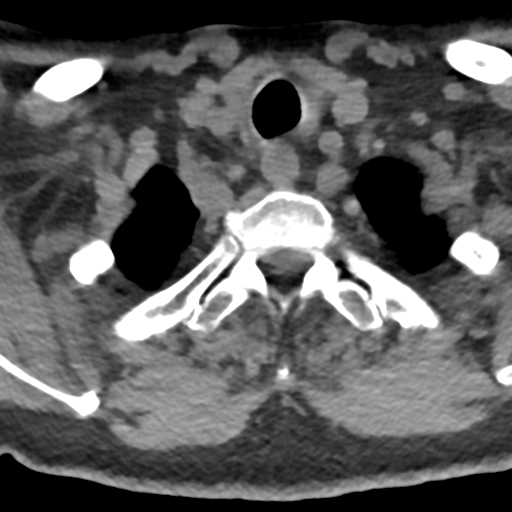
[im 17/101  bone]
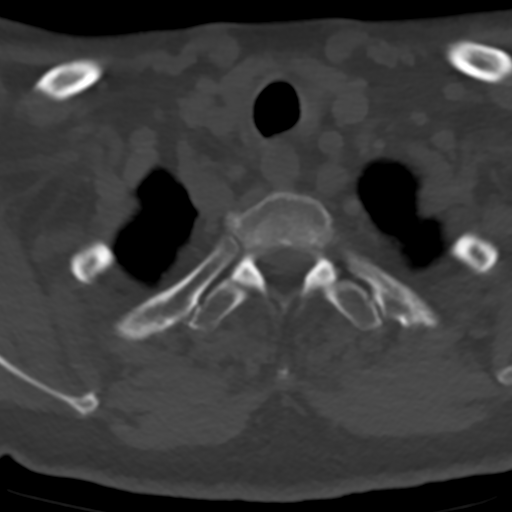
[im 51/101  bone]
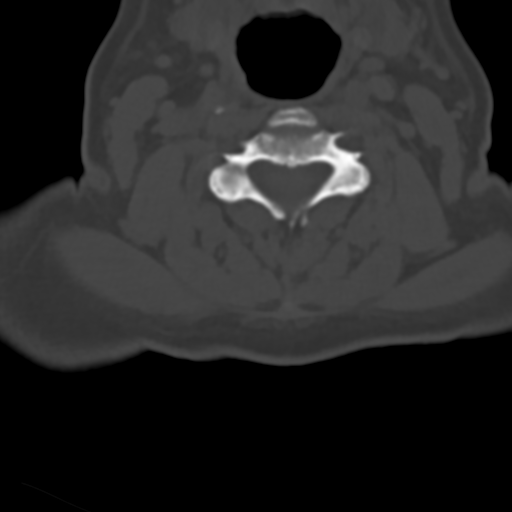
[im 84/101  bone]
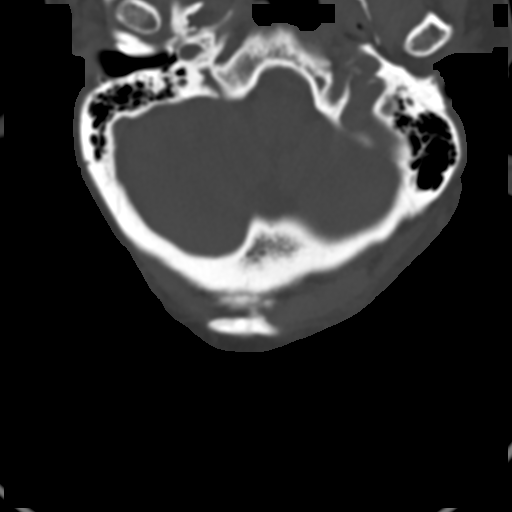

[Series 8: coronal bone · coronal · 0.30mm/px · 3 of 61 slices shown]
[im 13/61  bone]
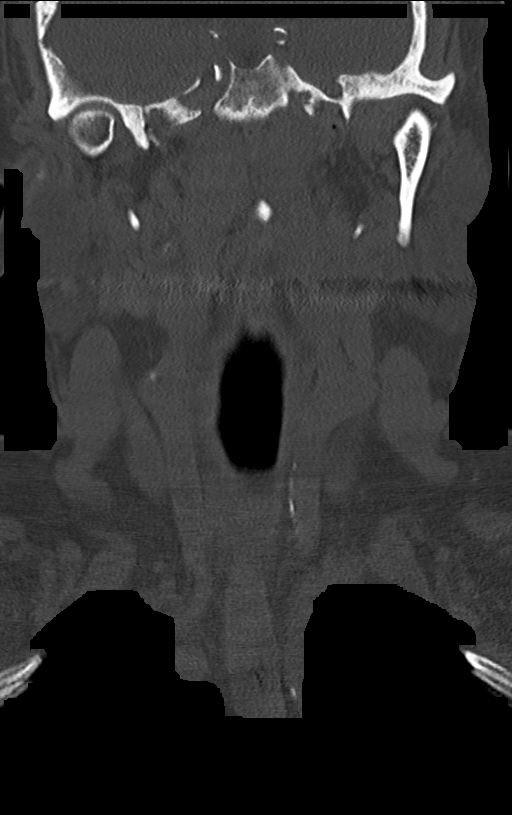
[im 25/61  bone]
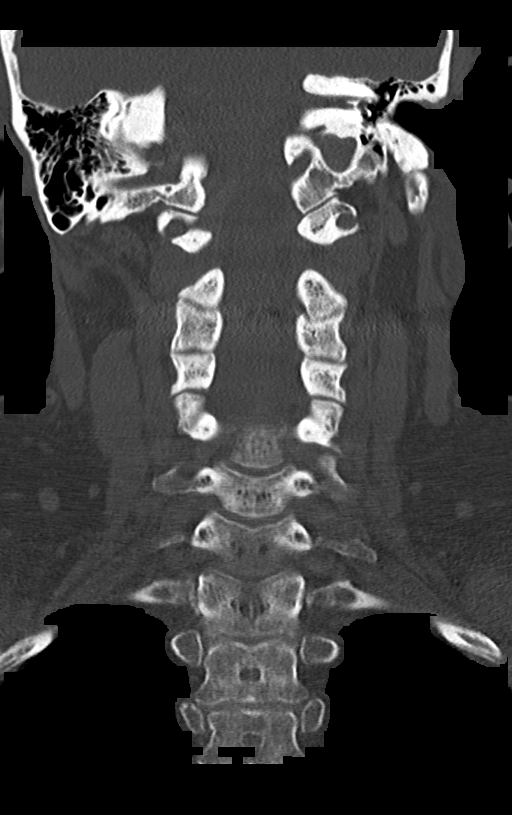
[im 37/61  bone]
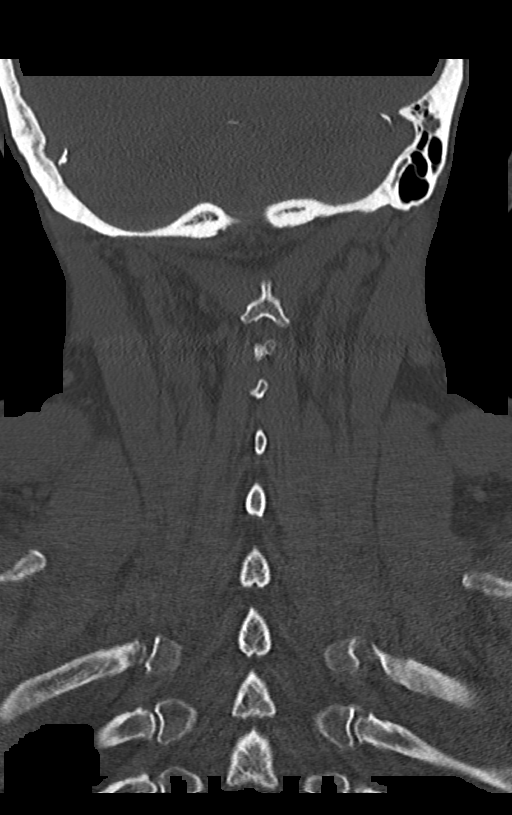

[Series 9: sagittal bone · sagittal · 0.30mm/px · 5 of 61 slices shown, 6 images]
[im 21/61  bone]
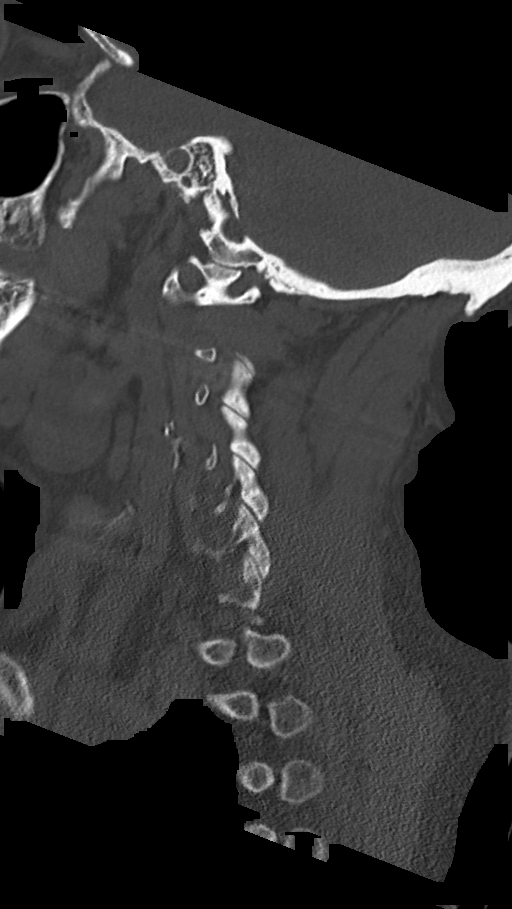
[im 26/61  bone]
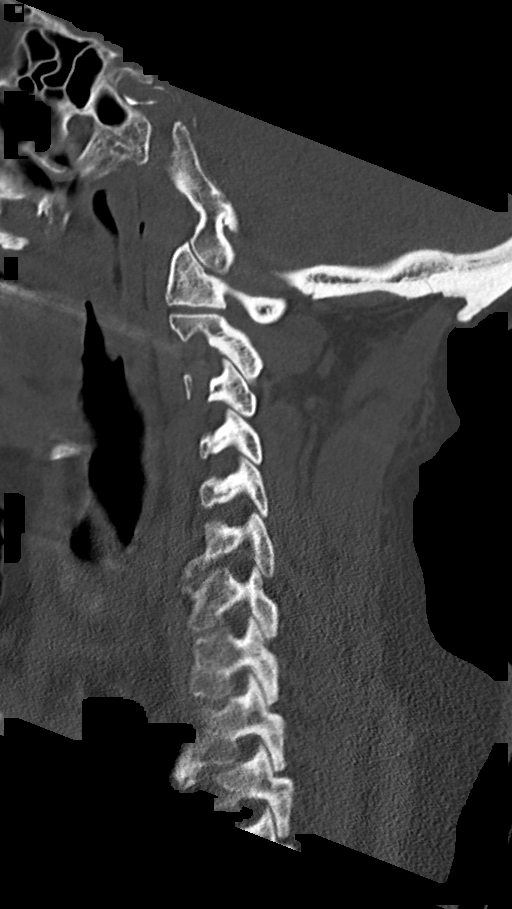
[im 31/61  soft-tissue]
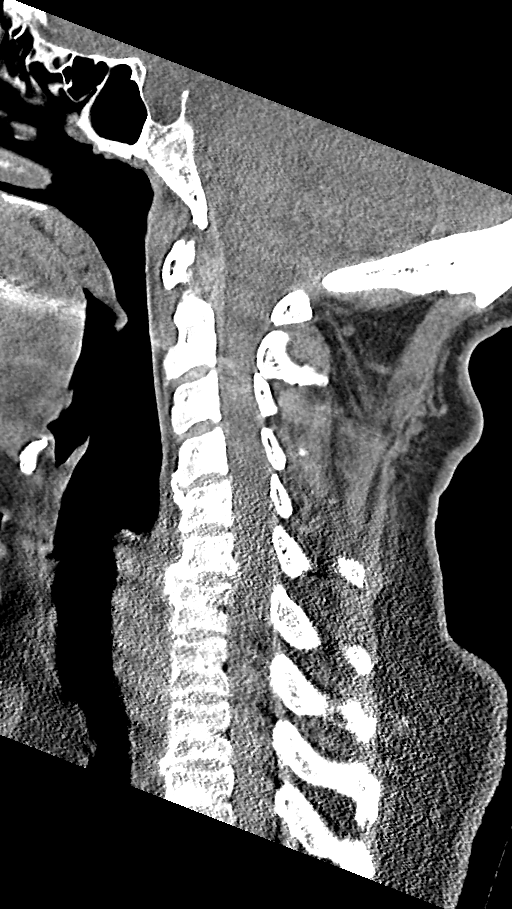
[im 31/61  bone]
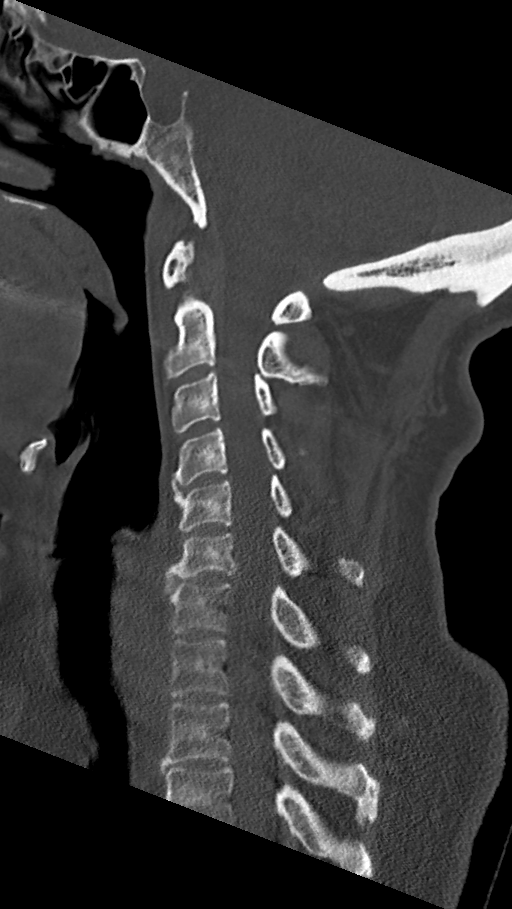
[im 36/61  bone]
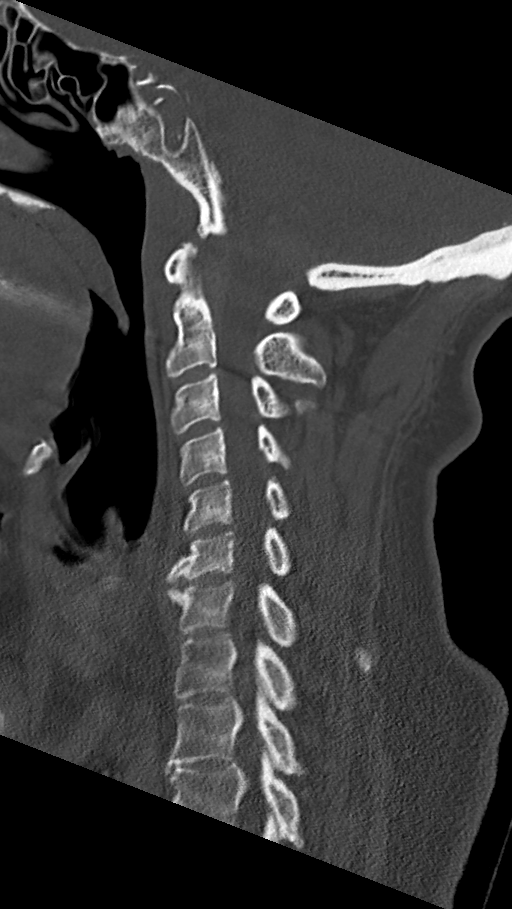
[im 41/61  bone]
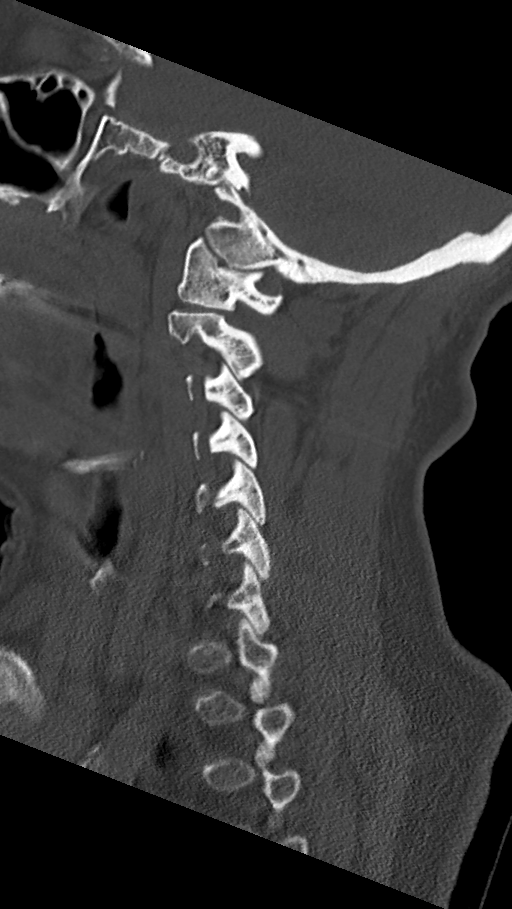

[11 of 33 positions shown; findings below may reference images not displayed]

FINDINGS: CT HEAD FINDINGS

Brain: No evidence of acute infarction, hemorrhage, hydrocephalus,
extra-axial collection or mass lesion/mass effect.

Vascular: No hyperdense vessel or unexpected calcification.

Skull: Normal. Negative for fracture or focal lesion.

Sinuses/Orbits: No acute finding.

Other: Mild scalp hematoma is noted in the left posterior parietal
region related to the recent injury.

CT CERVICAL SPINE FINDINGS

Alignment: Mild straightening of the normal cervical lordosis is
noted likely related to muscular spasm.

Skull base and vertebrae: 7 cervical segments are well visualized.
Vertebral body height is well maintained. Mild osteophytic changes
are noted primarily at C6-7 anteriorly. No acute fracture or acute
facet abnormality is noted. The odontoid is within normal limits.

Soft tissues and spinal canal: There is a 16 by 25 mm mildly
hyperdense nodule identified in the posterior aspect of the left
parotid gland. This was present on prior CT of the neck from
08/25/2020 and is somewhat smaller in size when compared with the
prior exam.

Upper chest: Visualized lung apices are within normal limits.

Other: None
IMPRESSION: CT of the head: Mild scalp hematoma in the left posterior parietal
region. No acute intracranial abnormality noted.

CT of the cervical spine: Mild degenerative change of the cervical
spine without acute bony abnormality.

Left parotid lesion somewhat smaller and less heterogeneous when
compared with the prior exam as described. ENT referral is again
recommended if this has not been performed.

## 2022-08-25 ENCOUNTER — Other Ambulatory Visit: Payer: Self-pay | Admitting: Family Medicine

## 2022-08-25 DIAGNOSIS — Z1231 Encounter for screening mammogram for malignant neoplasm of breast: Secondary | ICD-10-CM

## 2022-09-01 DIAGNOSIS — R221 Localized swelling, mass and lump, neck: Secondary | ICD-10-CM | POA: Diagnosis not present

## 2022-09-01 DIAGNOSIS — J3489 Other specified disorders of nose and nasal sinuses: Secondary | ICD-10-CM | POA: Diagnosis not present

## 2022-09-01 DIAGNOSIS — K1379 Other lesions of oral mucosa: Secondary | ICD-10-CM | POA: Diagnosis not present

## 2022-09-13 DIAGNOSIS — H5212 Myopia, left eye: Secondary | ICD-10-CM | POA: Diagnosis not present

## 2022-09-13 DIAGNOSIS — H5231 Anisometropia: Secondary | ICD-10-CM | POA: Diagnosis not present

## 2022-09-13 DIAGNOSIS — H52223 Regular astigmatism, bilateral: Secondary | ICD-10-CM | POA: Diagnosis not present

## 2022-09-13 DIAGNOSIS — H2513 Age-related nuclear cataract, bilateral: Secondary | ICD-10-CM | POA: Diagnosis not present

## 2022-09-13 DIAGNOSIS — H43813 Vitreous degeneration, bilateral: Secondary | ICD-10-CM | POA: Diagnosis not present

## 2022-10-26 ENCOUNTER — Ambulatory Visit
Admission: RE | Admit: 2022-10-26 | Discharge: 2022-10-26 | Disposition: A | Discharge status: Home / Self Care | Payer: Medicare HMO | Source: Ambulatory Visit | Attending: Family Medicine | Admitting: Family Medicine

## 2022-10-26 DIAGNOSIS — Z1231 Encounter for screening mammogram for malignant neoplasm of breast: Secondary | ICD-10-CM | POA: Diagnosis not present

## 2022-11-02 DIAGNOSIS — I10 Essential (primary) hypertension: Secondary | ICD-10-CM | POA: Diagnosis not present

## 2022-11-02 DIAGNOSIS — Z1211 Encounter for screening for malignant neoplasm of colon: Secondary | ICD-10-CM | POA: Diagnosis not present

## 2022-11-02 DIAGNOSIS — E1169 Type 2 diabetes mellitus with other specified complication: Secondary | ICD-10-CM | POA: Diagnosis not present

## 2022-11-02 DIAGNOSIS — Z720 Tobacco use: Secondary | ICD-10-CM | POA: Diagnosis not present

## 2022-11-02 DIAGNOSIS — E039 Hypothyroidism, unspecified: Secondary | ICD-10-CM | POA: Diagnosis not present

## 2022-11-02 DIAGNOSIS — E785 Hyperlipidemia, unspecified: Secondary | ICD-10-CM | POA: Diagnosis not present

## 2022-11-02 DIAGNOSIS — Z0000 Encounter for general adult medical examination without abnormal findings: Secondary | ICD-10-CM | POA: Diagnosis not present

## 2022-11-09 ENCOUNTER — Other Ambulatory Visit (HOSPITAL_BASED_OUTPATIENT_CLINIC_OR_DEPARTMENT_OTHER): Payer: Self-pay | Admitting: Family Medicine

## 2022-11-09 DIAGNOSIS — Z720 Tobacco use: Secondary | ICD-10-CM

## 2022-12-13 ENCOUNTER — Encounter (HOSPITAL_COMMUNITY): Payer: Self-pay

## 2022-12-13 ENCOUNTER — Ambulatory Visit (HOSPITAL_COMMUNITY)
Admission: RE | Admit: 2022-12-13 | Discharge: 2022-12-13 | Disposition: A | Discharge status: Home / Self Care | Payer: Medicare HMO | Source: Ambulatory Visit | Attending: Family Medicine | Admitting: Family Medicine

## 2022-12-13 DIAGNOSIS — J439 Emphysema, unspecified: Secondary | ICD-10-CM | POA: Insufficient documentation

## 2022-12-13 DIAGNOSIS — I700 Atherosclerosis of aorta: Secondary | ICD-10-CM | POA: Insufficient documentation

## 2022-12-13 DIAGNOSIS — I2510 Atherosclerotic heart disease of native coronary artery without angina pectoris: Secondary | ICD-10-CM | POA: Insufficient documentation

## 2022-12-13 DIAGNOSIS — Z122 Encounter for screening for malignant neoplasm of respiratory organs: Secondary | ICD-10-CM | POA: Diagnosis not present

## 2022-12-13 DIAGNOSIS — F17210 Nicotine dependence, cigarettes, uncomplicated: Secondary | ICD-10-CM | POA: Insufficient documentation

## 2022-12-13 DIAGNOSIS — Z720 Tobacco use: Secondary | ICD-10-CM | POA: Diagnosis present

## 2023-03-03 ENCOUNTER — Other Ambulatory Visit: Payer: Self-pay | Admitting: Student

## 2023-03-14 DIAGNOSIS — K621 Rectal polyp: Secondary | ICD-10-CM | POA: Diagnosis not present

## 2023-03-14 DIAGNOSIS — Z1211 Encounter for screening for malignant neoplasm of colon: Secondary | ICD-10-CM | POA: Diagnosis not present

## 2023-03-14 DIAGNOSIS — K635 Polyp of colon: Secondary | ICD-10-CM | POA: Diagnosis not present

## 2023-03-14 DIAGNOSIS — K648 Other hemorrhoids: Secondary | ICD-10-CM | POA: Diagnosis not present

## 2023-03-14 DIAGNOSIS — Z980 Intestinal bypass and anastomosis status: Secondary | ICD-10-CM | POA: Diagnosis not present

## 2023-03-15 DIAGNOSIS — H52221 Regular astigmatism, right eye: Secondary | ICD-10-CM | POA: Diagnosis not present

## 2023-03-15 DIAGNOSIS — H2513 Age-related nuclear cataract, bilateral: Secondary | ICD-10-CM | POA: Diagnosis not present

## 2023-03-15 DIAGNOSIS — H5212 Myopia, left eye: Secondary | ICD-10-CM | POA: Diagnosis not present

## 2023-03-15 DIAGNOSIS — H5201 Hypermetropia, right eye: Secondary | ICD-10-CM | POA: Diagnosis not present

## 2023-03-15 DIAGNOSIS — H35033 Hypertensive retinopathy, bilateral: Secondary | ICD-10-CM | POA: Diagnosis not present

## 2023-03-15 DIAGNOSIS — H43813 Vitreous degeneration, bilateral: Secondary | ICD-10-CM | POA: Diagnosis not present

## 2023-04-30 NOTE — Progress Notes (Unsigned)
  Electrophysiology Office Note:   Date:  04/30/2023  ID:  Kristina Gates, DOB 1956/05/11, MRN [id number]  Primary Cardiologist: None Electrophysiologist: Hillis Range, MD (Inactive)   History of Present Illness:   Kristina Gates is a 67 y.o. female with h/o HTN, DM2, HLD, and SVT (in the setting of acute illness seen today for routine electrophysiology followup. Since last being seen in our clinic the patient reports doing ***.  she denies chest pain, palpitations, dyspnea, PND, orthopnea, nausea, vomiting, dizziness, syncope, edema, weight gain, or early satiety.   Review of systems complete and found to be negative unless listed in HPI.   Studies Reviewed:    EKG is ordered today. Personal review shows ***  ***  Risk Assessment/Calculations:   {Does this patient have ATRIAL FIBRILLATION?:[phone]} No BP recorded.  {Refresh Note OR Click here to enter BP  :1}***        Physical Exam:   VS:  There were no vitals taken for this visit.   Wt Readings from Last 3 Encounters:  07/07/22 134 lb 7.7 oz (61 kg)  07/04/22 135 lb 9.6 oz (61.5 kg)  06/16/22 135 lb (61.2 kg)     GEN: Well nourished, well developed in no acute distress NECK: No JVD; No carotid bruits CARDIAC: {EPRHYTHM:28826}, no murmurs, rubs, gallops RESPIRATORY:  Clear to auscultation without rales, wheezing or rhonchi  ABDOMEN: Soft, non-tender, non-distended EXTREMITIES:  No edema; No deformity   ASSESSMENT AND PLAN:    SVT  With psuedo R' after QRS, cannot rule out AVNRT Currently well controlled *** on Toprol 25 mg daily.  EF normal 01/2021 If not controlled on medications or recurs outside of the setting of infection, she may be candidate for ablation in the future. She would like to avoid this if possible.  Encouraged hydration as this has been a clear trigger for her in the past.   HTN Stable on current regimen   {Click here to Review PMH, Prob List, Meds, Allergies, SHx, FHx  :1}   Follow up with  {WUJWJ:19147} {EPFOLLOW WG:95621}  Signed, Graciella Freer, PA-C

## 2023-05-02 ENCOUNTER — Ambulatory Visit: Payer: Medicare HMO | Attending: Student | Admitting: Student

## 2023-05-02 ENCOUNTER — Encounter: Payer: Self-pay | Admitting: Student

## 2023-05-02 VITALS — BP 124/60 | HR 66 | Ht 64.0 in | Wt 145.6 lb

## 2023-05-02 DIAGNOSIS — I471 Supraventricular tachycardia, unspecified: Secondary | ICD-10-CM

## 2023-05-02 DIAGNOSIS — I10 Essential (primary) hypertension: Secondary | ICD-10-CM | POA: Diagnosis not present

## 2023-05-02 MED ORDER — LOSARTAN POTASSIUM 50 MG PO TABS: 50.0000 mg | ORAL_TABLET | Freq: Every day | ORAL | 0 refills | Status: AC

## 2023-05-02 MED ORDER — METOPROLOL SUCCINATE ER 25 MG PO TB24: 25.0000 mg | ORAL_TABLET | Freq: Every day | ORAL | 3 refills | Status: DC

## 2023-05-02 NOTE — Patient Instructions (Signed)
Medication Instructions:  Your physician recommends that you continue on your current medications as directed. Please refer to the Current Medication list given to you today.  *If you need a refill on your cardiac medications before your next appointment, please call your pharmacy*  Lab Work: None ordered If you have labs (blood work) drawn today and your tests are completely normal, you will receive your results only by: MyChart Message (if you have MyChart) OR A paper copy in the mail If you have any lab test that is abnormal or we need to change your treatment, we will call you to review the results.  Follow-Up: At [facility], you and your health needs are our priority.  As part of our continuing mission to provide you with exceptional heart care, we have created designated Provider Care Teams.  These Care Teams include your primary Cardiologist (physician) and Advanced Practice Providers (APPs -  Physician Assistants and Nurse Practitioners) who all work together to provide you with the care you need, when you need it.  Your next appointment:   1 year(s)  Provider:   Will Camnitz, MD  

## 2023-05-05 DIAGNOSIS — E119 Type 2 diabetes mellitus without complications: Secondary | ICD-10-CM | POA: Diagnosis not present

## 2023-05-05 DIAGNOSIS — R300 Dysuria: Secondary | ICD-10-CM | POA: Diagnosis not present

## 2023-05-05 DIAGNOSIS — J432 Centrilobular emphysema: Secondary | ICD-10-CM | POA: Diagnosis not present

## 2023-05-05 DIAGNOSIS — E785 Hyperlipidemia, unspecified: Secondary | ICD-10-CM | POA: Diagnosis not present

## 2023-05-05 DIAGNOSIS — Z720 Tobacco use: Secondary | ICD-10-CM | POA: Diagnosis not present

## 2023-05-05 DIAGNOSIS — E1169 Type 2 diabetes mellitus with other specified complication: Secondary | ICD-10-CM | POA: Diagnosis not present

## 2023-05-05 DIAGNOSIS — I10 Essential (primary) hypertension: Secondary | ICD-10-CM | POA: Diagnosis not present

## 2023-08-29 DIAGNOSIS — L309 Dermatitis, unspecified: Secondary | ICD-10-CM | POA: Diagnosis not present

## 2023-08-29 DIAGNOSIS — B079 Viral wart, unspecified: Secondary | ICD-10-CM | POA: Diagnosis not present

## 2023-08-29 DIAGNOSIS — M7989 Other specified soft tissue disorders: Secondary | ICD-10-CM | POA: Diagnosis not present

## 2023-09-12 ENCOUNTER — Other Ambulatory Visit: Payer: Self-pay | Admitting: Family Medicine

## 2023-09-12 DIAGNOSIS — Z1231 Encounter for screening mammogram for malignant neoplasm of breast: Secondary | ICD-10-CM

## 2023-09-13 DIAGNOSIS — H52221 Regular astigmatism, right eye: Secondary | ICD-10-CM | POA: Diagnosis not present

## 2023-09-13 DIAGNOSIS — H2513 Age-related nuclear cataract, bilateral: Secondary | ICD-10-CM | POA: Diagnosis not present

## 2023-09-13 DIAGNOSIS — H5201 Hypermetropia, right eye: Secondary | ICD-10-CM | POA: Diagnosis not present

## 2023-09-13 DIAGNOSIS — H5212 Myopia, left eye: Secondary | ICD-10-CM | POA: Diagnosis not present

## 2023-09-13 DIAGNOSIS — H35033 Hypertensive retinopathy, bilateral: Secondary | ICD-10-CM | POA: Diagnosis not present

## 2023-09-13 DIAGNOSIS — H43813 Vitreous degeneration, bilateral: Secondary | ICD-10-CM | POA: Diagnosis not present

## 2023-10-30 ENCOUNTER — Ambulatory Visit
Admission: RE | Admit: 2023-10-30 | Discharge: 2023-10-30 | Disposition: A | Discharge status: Home / Self Care | Payer: Medicare HMO | Source: Ambulatory Visit | Attending: Family Medicine | Admitting: Family Medicine

## 2023-10-30 DIAGNOSIS — Z1231 Encounter for screening mammogram for malignant neoplasm of breast: Secondary | ICD-10-CM

## 2023-12-04 DIAGNOSIS — E1169 Type 2 diabetes mellitus with other specified complication: Secondary | ICD-10-CM | POA: Diagnosis not present

## 2023-12-04 DIAGNOSIS — J432 Centrilobular emphysema: Secondary | ICD-10-CM | POA: Diagnosis not present

## 2023-12-04 DIAGNOSIS — E785 Hyperlipidemia, unspecified: Secondary | ICD-10-CM | POA: Diagnosis not present

## 2023-12-04 DIAGNOSIS — I10 Essential (primary) hypertension: Secondary | ICD-10-CM | POA: Diagnosis not present

## 2023-12-04 DIAGNOSIS — Z0000 Encounter for general adult medical examination without abnormal findings: Secondary | ICD-10-CM | POA: Diagnosis not present

## 2023-12-04 DIAGNOSIS — E039 Hypothyroidism, unspecified: Secondary | ICD-10-CM | POA: Diagnosis not present

## 2023-12-04 DIAGNOSIS — Z720 Tobacco use: Secondary | ICD-10-CM | POA: Diagnosis not present

## 2023-12-06 ENCOUNTER — Other Ambulatory Visit: Payer: Self-pay | Admitting: Family Medicine

## 2023-12-06 DIAGNOSIS — Z720 Tobacco use: Secondary | ICD-10-CM

## 2023-12-06 DIAGNOSIS — L821 Other seborrheic keratosis: Secondary | ICD-10-CM | POA: Diagnosis not present

## 2023-12-06 DIAGNOSIS — L728 Other follicular cysts of the skin and subcutaneous tissue: Secondary | ICD-10-CM | POA: Diagnosis not present

## 2023-12-06 DIAGNOSIS — D1801 Hemangioma of skin and subcutaneous tissue: Secondary | ICD-10-CM | POA: Diagnosis not present

## 2023-12-07 ENCOUNTER — Encounter: Payer: Self-pay | Admitting: Family Medicine

## 2023-12-28 ENCOUNTER — Ambulatory Visit
Admission: RE | Admit: 2023-12-28 | Discharge: 2023-12-28 | Disposition: A | Discharge status: Home / Self Care | Payer: Medicare HMO | Source: Ambulatory Visit | Attending: Family Medicine | Admitting: Family Medicine

## 2023-12-28 DIAGNOSIS — F17210 Nicotine dependence, cigarettes, uncomplicated: Secondary | ICD-10-CM | POA: Diagnosis not present

## 2023-12-28 DIAGNOSIS — Z720 Tobacco use: Secondary | ICD-10-CM

## 2024-02-13 DIAGNOSIS — H2512 Age-related nuclear cataract, left eye: Secondary | ICD-10-CM | POA: Diagnosis not present

## 2024-02-13 DIAGNOSIS — H25043 Posterior subcapsular polar age-related cataract, bilateral: Secondary | ICD-10-CM | POA: Diagnosis not present

## 2024-02-13 DIAGNOSIS — H18413 Arcus senilis, bilateral: Secondary | ICD-10-CM | POA: Diagnosis not present

## 2024-02-13 DIAGNOSIS — H2513 Age-related nuclear cataract, bilateral: Secondary | ICD-10-CM | POA: Diagnosis not present

## 2024-02-13 DIAGNOSIS — H25013 Cortical age-related cataract, bilateral: Secondary | ICD-10-CM | POA: Diagnosis not present

## 2024-02-21 ENCOUNTER — Other Ambulatory Visit: Payer: Self-pay | Admitting: Student

## 2024-04-17 DIAGNOSIS — H2512 Age-related nuclear cataract, left eye: Secondary | ICD-10-CM | POA: Diagnosis not present

## 2024-04-18 DIAGNOSIS — H2511 Age-related nuclear cataract, right eye: Secondary | ICD-10-CM | POA: Diagnosis not present

## 2024-04-22 DIAGNOSIS — H5201 Hypermetropia, right eye: Secondary | ICD-10-CM | POA: Diagnosis not present

## 2024-04-22 DIAGNOSIS — H5212 Myopia, left eye: Secondary | ICD-10-CM | POA: Diagnosis not present

## 2024-04-22 DIAGNOSIS — H20042 Secondary noninfectious iridocyclitis, left eye: Secondary | ICD-10-CM | POA: Diagnosis not present

## 2024-04-22 DIAGNOSIS — H52221 Regular astigmatism, right eye: Secondary | ICD-10-CM | POA: Diagnosis not present

## 2024-04-22 DIAGNOSIS — H2511 Age-related nuclear cataract, right eye: Secondary | ICD-10-CM | POA: Diagnosis not present

## 2024-04-22 DIAGNOSIS — H2512 Age-related nuclear cataract, left eye: Secondary | ICD-10-CM | POA: Diagnosis not present

## 2024-04-22 DIAGNOSIS — H43813 Vitreous degeneration, bilateral: Secondary | ICD-10-CM | POA: Diagnosis not present

## 2024-04-22 DIAGNOSIS — H35033 Hypertensive retinopathy, bilateral: Secondary | ICD-10-CM | POA: Diagnosis not present

## 2024-05-15 DIAGNOSIS — H2511 Age-related nuclear cataract, right eye: Secondary | ICD-10-CM | POA: Diagnosis not present

## 2024-05-23 DIAGNOSIS — H2512 Age-related nuclear cataract, left eye: Secondary | ICD-10-CM | POA: Diagnosis not present

## 2024-05-23 DIAGNOSIS — H2511 Age-related nuclear cataract, right eye: Secondary | ICD-10-CM | POA: Diagnosis not present

## 2024-05-23 DIAGNOSIS — H35033 Hypertensive retinopathy, bilateral: Secondary | ICD-10-CM | POA: Diagnosis not present

## 2024-05-23 DIAGNOSIS — H52221 Regular astigmatism, right eye: Secondary | ICD-10-CM | POA: Diagnosis not present

## 2024-05-23 DIAGNOSIS — H5203 Hypermetropia, bilateral: Secondary | ICD-10-CM | POA: Diagnosis not present

## 2024-05-23 DIAGNOSIS — H43813 Vitreous degeneration, bilateral: Secondary | ICD-10-CM | POA: Diagnosis not present

## 2024-05-23 DIAGNOSIS — H20041 Secondary noninfectious iridocyclitis, right eye: Secondary | ICD-10-CM | POA: Diagnosis not present

## 2024-06-03 DIAGNOSIS — R350 Frequency of micturition: Secondary | ICD-10-CM | POA: Diagnosis not present

## 2024-06-03 DIAGNOSIS — I10 Essential (primary) hypertension: Secondary | ICD-10-CM | POA: Diagnosis not present

## 2024-06-03 DIAGNOSIS — J439 Emphysema, unspecified: Secondary | ICD-10-CM | POA: Diagnosis not present

## 2024-06-03 DIAGNOSIS — Z720 Tobacco use: Secondary | ICD-10-CM | POA: Diagnosis not present

## 2024-06-03 DIAGNOSIS — E785 Hyperlipidemia, unspecified: Secondary | ICD-10-CM | POA: Diagnosis not present

## 2024-06-03 DIAGNOSIS — E1169 Type 2 diabetes mellitus with other specified complication: Secondary | ICD-10-CM | POA: Diagnosis not present

## 2024-06-13 DIAGNOSIS — Z0100 Encounter for examination of eyes and vision without abnormal findings: Secondary | ICD-10-CM | POA: Diagnosis not present

## 2024-07-19 ENCOUNTER — Other Ambulatory Visit: Payer: Self-pay | Admitting: Family Medicine

## 2024-07-19 DIAGNOSIS — Z720 Tobacco use: Secondary | ICD-10-CM

## 2024-12-30 ENCOUNTER — Other Ambulatory Visit

## 2025-01-07 ENCOUNTER — Other Ambulatory Visit
